# Patient Record
Sex: Female | Born: 1970
Health system: Southern US, Community
[De-identification: ages and names within clinical notes are randomized; demographics above are authoritative.]

## PROBLEM LIST (undated history)

## (undated) DIAGNOSIS — F209 Schizophrenia, unspecified: Secondary | ICD-10-CM

## (undated) DIAGNOSIS — F319 Bipolar disorder, unspecified: Secondary | ICD-10-CM

## (undated) DIAGNOSIS — F12150 Cannabis abuse with psychotic disorder with delusions: Secondary | ICD-10-CM

## (undated) DIAGNOSIS — Z8669 Personal history of other diseases of the nervous system and sense organs: Secondary | ICD-10-CM

## (undated) DIAGNOSIS — F419 Anxiety disorder, unspecified: Secondary | ICD-10-CM

## (undated) DIAGNOSIS — F329 Major depressive disorder, single episode, unspecified: Secondary | ICD-10-CM

## (undated) DIAGNOSIS — F22 Delusional disorders: Secondary | ICD-10-CM

## (undated) DIAGNOSIS — H00036 Abscess of eyelid left eye, unspecified eyelid: Secondary | ICD-10-CM

## (undated) DIAGNOSIS — F32A Depression, unspecified: Secondary | ICD-10-CM

## (undated) DIAGNOSIS — F988 Other specified behavioral and emotional disorders with onset usually occurring in childhood and adolescence: Secondary | ICD-10-CM

## (undated) DIAGNOSIS — N946 Dysmenorrhea, unspecified: Secondary | ICD-10-CM

## (undated) DIAGNOSIS — E559 Vitamin D deficiency, unspecified: Secondary | ICD-10-CM

## (undated) HISTORY — PX: WISDOM TOOTH EXTRACTION: SHX21

## (undated) HISTORY — DX: Depression, unspecified: F32.A

## (undated) HISTORY — DX: Other specified behavioral and emotional disorders with onset usually occurring in childhood and adolescence: F98.8

## (undated) HISTORY — DX: Vitamin D deficiency, unspecified: E55.9

## (undated) HISTORY — DX: Personal history of other diseases of the nervous system and sense organs: Z86.69

## (undated) HISTORY — DX: Delusional disorders: F22

## (undated) HISTORY — DX: Schizophrenia, unspecified: F20.9

## (undated) HISTORY — DX: Bipolar disorder, unspecified: F31.9

## (undated) HISTORY — DX: Major depressive disorder, single episode, unspecified: F32.9

## (undated) HISTORY — DX: Anxiety disorder, unspecified: F41.9

## (undated) HISTORY — DX: Dysmenorrhea, unspecified: N94.6

## (undated) HISTORY — DX: Cannabis abuse with psychotic disorder with delusions: F12.150

---

## 1998-05-14 ENCOUNTER — Emergency Department (HOSPITAL_COMMUNITY): Admission: EM | Admit: 1998-05-14 | Discharge: 1998-05-15 | Payer: Self-pay | Admitting: Emergency Medicine

## 2000-06-18 ENCOUNTER — Observation Stay (HOSPITAL_COMMUNITY): Admission: AD | Admit: 2000-06-18 | Discharge: 2000-06-19 | Payer: Self-pay | Admitting: *Deleted

## 2005-05-06 ENCOUNTER — Inpatient Hospital Stay (HOSPITAL_COMMUNITY): Admission: AD | Admit: 2005-05-06 | Discharge: 2005-05-08 | Payer: Self-pay | Admitting: Obstetrics and Gynecology

## 2005-06-20 ENCOUNTER — Encounter: Admission: RE | Admit: 2005-06-20 | Discharge: 2005-06-20 | Payer: Self-pay | Admitting: Obstetrics and Gynecology

## 2014-01-15 DIAGNOSIS — H00036 Abscess of eyelid left eye, unspecified eyelid: Secondary | ICD-10-CM

## 2014-01-15 HISTORY — DX: Abscess of eyelid left eye, unspecified eyelid: H00.036

## 2014-01-17 ENCOUNTER — Ambulatory Visit (INDEPENDENT_AMBULATORY_CARE_PROVIDER_SITE_OTHER): Payer: BC Managed Care – PPO | Admitting: Physician Assistant

## 2014-01-17 VITALS — BP 146/84 | HR 98 | Temp 98.1°F | Resp 16 | Ht 65.0 in | Wt 181.4 lb

## 2014-01-17 DIAGNOSIS — L0201 Cutaneous abscess of face: Secondary | ICD-10-CM

## 2014-01-17 DIAGNOSIS — L03211 Cellulitis of face: Principal | ICD-10-CM

## 2014-01-17 LAB — POCT CBC
Granulocyte percent: 69.8 %G (ref 37–80)
HCT, POC: 44.9 % (ref 37.7–47.9)
Hemoglobin: 14.3 g/dL (ref 12.2–16.2)
Lymph, poc: 2.2 (ref 0.6–3.4)
MCH, POC: 32.9 pg — AB (ref 27–31.2)
MCHC: 31.8 g/dL (ref 31.8–35.4)
MCV: 103.4 fL — AB (ref 80–97)
MID (cbc): 0.5 (ref 0–0.9)
MPV: 7.9 fL (ref 0–99.8)
POC Granulocyte: 6.3 (ref 2–6.9)
POC LYMPH PERCENT: 24.1 %L (ref 10–50)
POC MID %: 6.1 %M (ref 0–12)
Platelet Count, POC: 282 10*3/uL (ref 142–424)
RBC: 4.34 M/uL (ref 4.04–5.48)
RDW, POC: 13.5 %
WBC: 9 10*3/uL (ref 4.6–10.2)

## 2014-01-17 MED ORDER — DOXYCYCLINE HYCLATE 100 MG PO CAPS: 100.0000 mg | ORAL_CAPSULE | Freq: Two times a day (BID) | ORAL | Status: DC

## 2014-01-17 NOTE — Progress Notes (Signed)
   Subjective:    Patient ID: Angelica Weeks, female    DOB: Jul 18, 1971, 43 y.o.   MRN: [id number]  HPI 43 year old female presents for evaluation of treatment of swelling above her left eye. States for the past 2 days she has had a small "pimple" above her eye. Then this morning she woke up and her whole eye was swollen and painful to touch.  Has noted a small amount of clear drainage from the bump.    Denies fever, chills, headache, nausea, vomiting, or dizziness. No drainage from her eye or irritation.    Patient is otherwise doing well with no other concerns today.    Review of Systems  Constitutional: Negative for fever and chills.  Gastrointestinal: Negative for nausea and vomiting.  Skin: Positive for wound.  Neurological: Negative for dizziness and headaches.       Objective:   Physical Exam  Constitutional: She is oriented to person, place, and time. She appears well-developed and well-nourished.  HENT:  Head: Normocephalic and atraumatic.    Right Ear: External ear normal.  Left Ear: External ear normal.  Noted are has small scab with surrounding erythema. +swelling and +TTP.  There is some induration. Tenderness does not extend laterally across superior orbit or involve inferior orbit  Eyes: Conjunctivae are normal.  Neck: Normal range of motion. Neck supple.  Cardiovascular: Normal rate.   Pulmonary/Chest: Effort normal.  Neurological: She is alert and oriented to person, place, and time.  Psychiatric: She has a normal mood and affect. Her behavior is normal. Judgment and thought content normal.     Results for orders placed in visit on 01/17/14  POCT CBC      Result Value Range   WBC 9.0  4.6 - 10.2 K/uL   Lymph, poc 2.2  0.6 - 3.4   POC LYMPH PERCENT 24.1  10 - 50 %L   MID (cbc) 0.5  0 - 0.9   POC MID % 6.1  0 - 12 %M   POC Granulocyte 6.3  2 - 6.9   Granulocyte percent 69.8  37 - 80 %G   RBC 4.34  4.04 - 5.48 M/uL   Hemoglobin 14.3  12.2 - 16.2 g/dL   HCT, POC 13.244.9  44.037.7 - 47.9 %   MCV 103.4 (*) 80 - 97 fL   MCH, POC 32.9 (*) 27 - 31.2 pg   MCHC 31.8  31.8 - 35.4 g/dL   RDW, POC 10.213.5     Platelet Count, POC 282  142 - 424 K/uL   MPV 7.9  0 - 99.8 fL   Procedure: VCO. Local anesthesia with 2% lidocaine plain.  Aspiration attempted with 18G needle. No purulence obtained. Cleaned and band-aid applied. Patient tolerated well.      Assessment & Plan:  Cellulitis and abscess of face - Plan: POCT CBC, doxycycline (VIBRAMYCIN) 100 MG capsule  Early cellulitis - start doxycycline 100 mg bid x 10 days Warm compresses 2-3 times daily. Recheck tomorrow, sooner if worse.  Out of work today and tomorrow.

## 2014-01-18 ENCOUNTER — Ambulatory Visit (INDEPENDENT_AMBULATORY_CARE_PROVIDER_SITE_OTHER): Payer: BC Managed Care – PPO | Admitting: Emergency Medicine

## 2014-01-18 ENCOUNTER — Encounter: Payer: Self-pay | Admitting: Emergency Medicine

## 2014-01-18 VITALS — BP 144/100 | HR 102 | Temp 98.2°F | Resp 16 | Ht 65.5 in | Wt 180.0 lb

## 2014-01-18 DIAGNOSIS — L0291 Cutaneous abscess, unspecified: Secondary | ICD-10-CM

## 2014-01-18 DIAGNOSIS — L0390 Cellulitis, unspecified: Secondary | ICD-10-CM

## 2014-01-18 DIAGNOSIS — F319 Bipolar disorder, unspecified: Secondary | ICD-10-CM | POA: Insufficient documentation

## 2014-01-18 DIAGNOSIS — R030 Elevated blood-pressure reading, without diagnosis of hypertension: Secondary | ICD-10-CM

## 2014-01-18 DIAGNOSIS — F988 Other specified behavioral and emotional disorders with onset usually occurring in childhood and adolescence: Secondary | ICD-10-CM | POA: Insufficient documentation

## 2014-01-18 MED ORDER — CEFTRIAXONE SODIUM 1 G IJ SOLR
1.0000 g | Freq: Once | INTRAMUSCULAR | Status: AC
  Administered 2014-01-18: 1 g via INTRAMUSCULAR

## 2014-01-18 MED ORDER — DEXAMETHASONE SODIUM PHOSPHATE 100 MG/10ML IJ SOLN
10.0000 mg | Freq: Once | INTRAMUSCULAR | Status: AC
  Administered 2014-01-18: 10 mg via INTRAMUSCULAR

## 2014-01-18 NOTE — Patient Instructions (Signed)
Hypertension Hypertension is another name for high blood pressure. High blood pressure may mean that your heart needs to work harder to pump blood. Blood pressure consists of two numbers, which includes a higher number over a lower number (example: 110/72). HOME CARE   Make lifestyle changes as told by your doctor. This may include weight loss and exercise.  Take your blood pressure medicine every day.  Limit how much salt you use.  Stop smoking if you smoke.  Do not use drugs.  Talk to your doctor if you are using decongestants or birth control pills. These medicines might make blood pressure higher.  Females should not drink more than 1 alcoholic drink per day. Males should not drink more than 2 alcoholic drinks per day.  See your doctor as told. GET HELP RIGHT AWAY IF:   You have a blood pressure reading with a top number of 180 or higher.  You get a very bad headache.  You get blurred or changing vision.  You feel confused.  You feel weak, numb, or faint.  You get chest or belly (abdominal) pain.  You throw up (vomit).  You cannot breathe very well. MAKE SURE YOU:   Understand these instructions.  Will watch your condition.  Will get help right away if you are not doing well or get worse. Document Released: 05/19/2008 Document Revised: 02/23/2012 Document Reviewed: 05/19/2008 [facility] Patient Information 2014 [place], [place].   Orbital Cellulitis  ER if no improvement with symptoms in 24 hours or earlier if symptoms increase Orbital cellulitis is an infection of the soft tissue of the orbit without abscess formation. The eye socket is the area around and behind the eye. It usually comes on suddenly in children, but can happen at any age. This condition can lead to death if untreated.  CAUSES   A germ (bacterial) infection of the area around and behind the eye.  Long-term (chronic) sinus infections.  An object (foreign body) stuck behind the eye.  An  injury that goes through (penetrates) to tissues of the eyelids.  Trauma with secondary infection.  Fracture of the boney wall or floor of the orbit.  Serious eyelid infections.  Bite wounds.  Inflammation or infection of the lining membranes of the brain (meningitis).  Blood poisoning or infection (septicemia).  Dental area filled with pus (abscesses).  Severe uncontrolled diabetes (ketoacidosis). SYMPTOMS   Pain in the eye.  Redness and puffiness (swelling) of the eyelids. The swelling is often bad enough that the eye has to close.  Fever and feeling generally ill.  The lids and the cheek may be very red, hot and swollen.  A drop in vision.  Pain when touching the area around the eye.  The eye itself may look like it is "popping out" (proptosis).  Double vision  seeing two of everything (diplopia). DIAGNOSIS  An ophthalmologist can tell you if you have orbital cellulitis during an eye exam. It is important to know if the infection goes into the area behind the eye. True orbital cellulitis is a medical emergency. A CT scan may be needed to see if the sinuses are involved. A CT scan can also see if an abscess has formed behind the eye. TREATMENT  Orbital cellulitis should be treated in the hospital with medicine that kills germs (antibiotics). These antibiotics are given right into the bloodstream through a vein (intravenous). SEEK IMMEDIATE MEDICAL CARE IF:   You have red, swollen eyelids.  You have a fever.  You develop  double vision. MAKE SURE YOU:   Understand these instructions.  Will watch your condition.  Will get help right away if you are not doing well or get worse. Document Released: 11/25/2001 Document Revised: 02/23/2012 Document Reviewed: 03/27/2008 [facility] Patient Information 2014 [place].

## 2014-01-18 NOTE — Progress Notes (Signed)
   Subjective:    Patient ID: Angelica Weeks, female    DOB: [birth year], 43 y.o.   MRN: [id number]  HPI Comments: 43 yo female patient has not been seen since 2009 and has visit today for swollen left face. She denies bite/ trauma. She woke up with a large zit appearing inflammation at eyebrow. [facility] was attempted without success. She was started on Doxy 100 and has had 2 pills without any improvement with pain/ edema/ redness. She denies visual changes. She has been using hot compresses.    She denies hx of elevated BP and gets checked at Dr Nolen MuMckinney/ Psych q 6 months and has been normal.   Eye Pain   Headache  Associated symptoms include eye pain.   Current Outpatient Prescriptions on File Prior to Visit  Medication Sig Dispense Refill  . ALPRAZolam (XANAX) 1 MG tablet Take 1 mg by mouth 3 (three) times daily.      Marland Kitchen. amphetamine-dextroamphetamine (ADDERALL XR) 20 MG 24 hr capsule Take 20 mg by mouth 3 (three) times daily.      Marland Kitchen. desvenlafaxine (PRISTIQ) 100 MG 24 hr tablet Take 100 mg by mouth daily.      Marland Kitchen. doxycycline (VIBRAMYCIN) 100 MG capsule Take 1 capsule (100 mg total) by mouth 2 (two) times daily.  20 capsule  0  . LamoTRIgine XR (LAMICTAL XR) 200 MG TB24 Take by mouth.      . norethindrone-ethinyl estradiol (JUNEL FE,GILDESS FE,LOESTRIN FE) 1-20 MG-MCG tablet Take 1 tablet by mouth daily.       No current facility-administered medications on file prior to visit.   ALLERGIES No Known Allergies Past Medical History  Diagnosis Date  . ADD (attention deficit disorder)   . Depression   . Bipolar 1 disorder, depressed       Review of Systems  Eyes: Positive for pain.  Skin: Positive for color change and wound.  Neurological: Positive for headaches.  All other systems reviewed and are negative.   BP 144/100  Pulse 102  Temp(Src) 98.2 F (36.8 C) (Oral)  Resp 16  Ht 5' 5.5" (1.664 m)  Wt 180 lb (81.647 kg)  BMI 29.49 kg/m2  LMP  01/08/2014    recheck 144/88 Objective:   Physical Exam  Nursing note and vitals reviewed. Constitutional: She is oriented to person, place, and time. She appears well-developed and well-nourished.  HENT:  Head: Normocephalic and atraumatic.    Right Ear: External ear normal.  Left Ear: External ear normal.  Nose: Nose normal.  Mouth/Throat: Oropharynx is clear and moist. No oropharyngeal exudate.  Eyes: Conjunctivae and EOM are normal.  Neck: Normal range of motion.  Cardiovascular: Normal rate, regular rhythm, normal heart sounds and intact distal pulses.   Pulmonary/Chest: Effort normal and breath sounds normal.  Musculoskeletal: Normal range of motion.  Lymphadenopathy:    She has no cervical adenopathy.  Neurological: She is alert and oriented to person, place, and time.  Skin: Skin is warm and dry.  Psychiatric: She has a normal mood and affect. Judgment normal.          Assessment & Plan:  1. Cellulitis- Rocephin 1gm Dexamethasone 10 mg ADvised ER, declines currently but will go if no change or increased symptoms. Patient declines labs currently. 2. Elevated BP w/o HTN- Check BP call if >130/80, increase cardio

## 2014-01-20 ENCOUNTER — Encounter (HOSPITAL_COMMUNITY): Payer: Self-pay | Admitting: Emergency Medicine

## 2014-01-20 ENCOUNTER — Observation Stay (HOSPITAL_COMMUNITY)
Admission: EM | Admit: 2014-01-20 | Discharge: 2014-01-21 | Disposition: A | Discharge status: Home / Self Care | Payer: BC Managed Care – PPO | Attending: Internal Medicine | Admitting: Internal Medicine

## 2014-01-20 ENCOUNTER — Emergency Department (HOSPITAL_COMMUNITY): Payer: BC Managed Care – PPO

## 2014-01-20 ENCOUNTER — Ambulatory Visit: Payer: Self-pay | Admitting: Emergency Medicine

## 2014-01-20 DIAGNOSIS — Z8614 Personal history of Methicillin resistant Staphylococcus aureus infection: Secondary | ICD-10-CM | POA: Insufficient documentation

## 2014-01-20 DIAGNOSIS — Z79899 Other long term (current) drug therapy: Secondary | ICD-10-CM | POA: Insufficient documentation

## 2014-01-20 DIAGNOSIS — H5789 Other specified disorders of eye and adnexa: Secondary | ICD-10-CM | POA: Insufficient documentation

## 2014-01-20 DIAGNOSIS — L0201 Cutaneous abscess of face: Principal | ICD-10-CM | POA: Insufficient documentation

## 2014-01-20 DIAGNOSIS — F988 Other specified behavioral and emotional disorders with onset usually occurring in childhood and adolescence: Secondary | ICD-10-CM | POA: Insufficient documentation

## 2014-01-20 DIAGNOSIS — L03211 Cellulitis of face: Principal | ICD-10-CM | POA: Insufficient documentation

## 2014-01-20 DIAGNOSIS — F319 Bipolar disorder, unspecified: Secondary | ICD-10-CM | POA: Diagnosis present

## 2014-01-20 DIAGNOSIS — L0390 Cellulitis, unspecified: Secondary | ICD-10-CM | POA: Diagnosis present

## 2014-01-20 HISTORY — DX: Abscess of eyelid left eye, unspecified eyelid: H00.036

## 2014-01-20 LAB — BASIC METABOLIC PANEL
BUN: 10 mg/dL (ref 6–23)
CALCIUM: 9 mg/dL (ref 8.4–10.5)
CHLORIDE: 100 meq/L (ref 96–112)
CO2: 25 mEq/L (ref 19–32)
Creatinine, Ser: 0.63 mg/dL (ref 0.50–1.10)
GLUCOSE: 94 mg/dL (ref 70–99)
Potassium: 3.4 mEq/L — ABNORMAL LOW (ref 3.7–5.3)
Sodium: 140 mEq/L (ref 137–147)

## 2014-01-20 LAB — HCG, SERUM, QUALITATIVE: PREG SERUM: NEGATIVE

## 2014-01-20 LAB — CBC WITH DIFFERENTIAL/PLATELET
Basophils Absolute: 0 10*3/uL (ref 0.0–0.1)
Basophils Relative: 0 % (ref 0–1)
EOS ABS: 0.1 10*3/uL (ref 0.0–0.7)
Eosinophils Relative: 1 % (ref 0–5)
HCT: 41.3 % (ref 36.0–46.0)
Hemoglobin: 14.2 g/dL (ref 12.0–15.0)
LYMPHS ABS: 2.5 10*3/uL (ref 0.7–4.0)
LYMPHS PCT: 21 % (ref 12–46)
MCH: 33.5 pg (ref 26.0–34.0)
MCHC: 34.4 g/dL (ref 30.0–36.0)
MCV: 97.4 fL (ref 78.0–100.0)
Monocytes Absolute: 1.1 10*3/uL — ABNORMAL HIGH (ref 0.1–1.0)
Monocytes Relative: 9 % (ref 3–12)
NEUTROS ABS: 8.1 10*3/uL — AB (ref 1.7–7.7)
NEUTROS PCT: 69 % (ref 43–77)
PLATELETS: 278 10*3/uL (ref 150–400)
RBC: 4.24 MIL/uL (ref 3.87–5.11)
RDW: 12.8 % (ref 11.5–15.5)
WBC: 11.8 10*3/uL — AB (ref 4.0–10.5)

## 2014-01-20 MED ORDER — SODIUM CHLORIDE 0.9 % IJ SOLN: 3.0000 mL | INTRAMUSCULAR | Status: DC | PRN

## 2014-01-20 MED ORDER — ALPRAZOLAM 0.5 MG PO TABS: 1.0000 mg | ORAL_TABLET | Freq: Three times a day (TID) | ORAL | Status: DC | PRN

## 2014-01-20 MED ORDER — PIPERACILLIN-TAZOBACTAM 3.375 G IVPB
3.3750 g | Freq: Three times a day (TID) | INTRAVENOUS | Status: DC
  Administered 2014-01-20 – 2014-01-21 (×3): 3.375 g via INTRAVENOUS
  Filled 2014-01-20 (×5): qty 50

## 2014-01-20 MED ORDER — DESVENLAFAXINE SUCCINATE ER 50 MG PO TB24: 50.0000 mg | ORAL_TABLET | Freq: Every day | ORAL | Status: DC

## 2014-01-20 MED ORDER — LAMOTRIGINE ER 200 MG PO TB24: 200.0000 mg | ORAL_TABLET | Freq: Every day | ORAL | Status: DC

## 2014-01-20 MED ORDER — LAMOTRIGINE 150 MG PO TABS: 300.0000 mg | ORAL_TABLET | Freq: Every day | ORAL | Status: DC

## 2014-01-20 MED ORDER — KETOROLAC TROMETHAMINE 30 MG/ML IJ SOLN
30.0000 mg | Freq: Once | INTRAMUSCULAR | Status: AC
  Administered 2014-01-20: 30 mg via INTRAVENOUS
  Filled 2014-01-20: qty 1

## 2014-01-20 MED ORDER — HEPARIN SODIUM (PORCINE) 5000 UNIT/ML IJ SOLN
5000.0000 [IU] | Freq: Three times a day (TID) | INTRAMUSCULAR | Status: DC
  Administered 2014-01-20 – 2014-01-21 (×3): 5000 [IU] via SUBCUTANEOUS
  Filled 2014-01-20 (×6): qty 1

## 2014-01-20 MED ORDER — LAMOTRIGINE 150 MG PO TABS
300.0000 mg | ORAL_TABLET | Freq: Every day | ORAL | Status: DC
  Filled 2014-01-20: qty 2

## 2014-01-20 MED ORDER — NORETHIN ACE-ETH ESTRAD-FE 1-20 MG-MCG PO TABS: 1.0000 | ORAL_TABLET | Freq: Every day | ORAL | Status: DC

## 2014-01-20 MED ORDER — VANCOMYCIN HCL IN DEXTROSE 1-5 GM/200ML-% IV SOLN
1000.0000 mg | Freq: Two times a day (BID) | INTRAVENOUS | Status: DC
  Administered 2014-01-20 – 2014-01-21 (×2): 1000 mg via INTRAVENOUS
  Filled 2014-01-20 (×3): qty 200

## 2014-01-20 MED ORDER — VANCOMYCIN HCL IN DEXTROSE 1-5 GM/200ML-% IV SOLN
1000.0000 mg | Freq: Once | INTRAVENOUS | Status: AC
  Administered 2014-01-20: 1000 mg via INTRAVENOUS
  Filled 2014-01-20: qty 200

## 2014-01-20 MED ORDER — OXYCODONE HCL 5 MG PO TABS
5.0000 mg | ORAL_TABLET | ORAL | Status: DC | PRN
  Administered 2014-01-20 – 2014-01-21 (×2): 5 mg via ORAL
  Filled 2014-01-20 (×2): qty 1

## 2014-01-20 MED ORDER — SODIUM CHLORIDE 0.9 % IV SOLN
250.0000 mL | INTRAVENOUS | Status: DC | PRN
Start: 2014-01-20 — End: 2014-01-21

## 2014-01-20 MED ORDER — VENLAFAXINE HCL ER 150 MG PO CP24
150.0000 mg | ORAL_CAPSULE | Freq: Every day | ORAL | Status: DC
Start: 2014-01-21 — End: 2014-01-20
  Filled 2014-01-20: qty 1

## 2014-01-20 MED ORDER — SODIUM CHLORIDE 0.9 % IJ SOLN
3.0000 mL | Freq: Two times a day (BID) | INTRAMUSCULAR | Status: DC
  Administered 2014-01-20 (×2): 3 mL via INTRAVENOUS

## 2014-01-20 MED ORDER — ACETAMINOPHEN 650 MG RE SUPP
650.0000 mg | Freq: Four times a day (QID) | RECTAL | Status: DC | PRN
Start: 2014-01-20 — End: 2014-01-21

## 2014-01-20 MED ORDER — ACETAMINOPHEN 325 MG PO TABS
650.0000 mg | ORAL_TABLET | Freq: Four times a day (QID) | ORAL | Status: DC | PRN
Start: 2014-01-20 — End: 2014-01-21

## 2014-01-20 MED ORDER — IOHEXOL 300 MG/ML  SOLN
75.0000 mL | Freq: Once | INTRAMUSCULAR | Status: AC | PRN
  Administered 2014-01-20: 75 mL via INTRAVENOUS

## 2014-01-20 MED ORDER — SODIUM CHLORIDE 0.9 % IV BOLUS (SEPSIS)
1000.0000 mL | Freq: Once | INTRAVENOUS | Status: AC
  Administered 2014-01-20: 1000 mL via INTRAVENOUS

## 2014-01-20 NOTE — Progress Notes (Signed)
ANTIBIOTIC CONSULT NOTE - INITIAL  Pharmacy Consult for vancomycin and Zosyn Indication: L orbital cellulitis  No Known Allergies  Patient Measurements: Height: 5\' 5"  (165.1 cm) Weight: 180 lb 12.4 oz (82 kg) IBW/kg (Calculated) : 57  Vital Signs: Temp: 98.3 F (36.8 C) (02/06 1253) Temp src: Oral (02/06 1253) BP: 138/88 mmHg (02/06 1253) Pulse Rate: 72 (02/06 1253) Intake/Output from previous day:   Intake/Output from this shift:    Labs:  Recent Labs  01/20/14 0820  WBC 11.8*  HGB 14.2  PLT 278  CREATININE 0.63   Estimated Creatinine Clearance: 98.9 ml/min (by C-G formula based on Cr of 0.63). No results found for this basename: VANCOTROUGH, VANCOPEAK, VANCORANDOM, GENTTROUGH, GENTPEAK, GENTRANDOM, TOBRATROUGH, TOBRAPEAK, TOBRARND, AMIKACINPEAK, AMIKACINTROU, AMIKACIN,  in the last 72 hours   Microbiology: No results found for this or any previous visit (from the past 720 hour(s)).  Medical History: Past Medical History  Diagnosis Date  . ADD (attention deficit disorder)   . Depression   . Bipolar 1 disorder, depressed   . Cellulitis of left eyelid 01/2014    Medications:  Prescriptions prior to admission  Medication Sig Dispense Refill  . ALPRAZolam (XANAX) 1 MG tablet Take 1 mg by mouth 3 (three) times daily as needed for anxiety.       Marland Kitchen. amphetamine-dextroamphetamine (ADDERALL XR) 20 MG 24 hr capsule Take 20 mg by mouth 3 (three) times daily.      Marland Kitchen. desvenlafaxine (PRISTIQ) 100 MG 24 hr tablet Take 100 mg by mouth daily.      Marland Kitchen. doxycycline (VIBRAMYCIN) 100 MG capsule Take 1 capsule (100 mg total) by mouth 2 (two) times daily.  20 capsule  0  . LamoTRIgine XR (LAMICTAL XR) 200 MG TB24 Take 200 mg by mouth daily.       . norethindrone-ethinyl estradiol (JUNEL FE,GILDESS FE,LOESTRIN FE) 1-20 MG-MCG tablet Take 1 tablet by mouth daily.       Assessment: 43 y/o female who presents to the ED with swelling, pain and redness above her L eyebrow which started out  as a pimple. She was seen at Urgent Care on 2/3 and prescribed doxycycline but returned to her PCP on 2/4 with worsening. She was given Rocephin and dexamethasone there.   Pharmacy consulted to begin vancomycin and Zosyn for L orbital cellulitis. She received vancomycin 1g IV at 09:05 this morning. She is afebrile, WBC are elevated, and renal function is normal. Blood cultures are pending.  Goal of Therapy:  Vancomycin trough level 10-15 mcg/ml  Plan:  -Vancomycin 1000 mg IV q12h -Zosyn 3.375 g IV q8h to be infused over 4 hours -Monitor renal function, clinical course, and [employer]  Sage Kopera [place], 17[address] Rainbow BoulevardPharm.D., BCPS Clinical Pharmacist Pager: [phone] 01/20/2014 2:02 PM

## 2014-01-20 NOTE — ED Notes (Signed)
Pt undressed, in gown, continuous bp cuff and pulse ox 

## 2014-01-20 NOTE — ED Notes (Signed)
Pt c/o left eye swelling. [facility]. Pt has small scabbed area above left eye that she stated she thought was a "pimple" and tried to pop it. Denies any problems seeing out of left eye.

## 2014-01-20 NOTE — H&P (Addendum)
[facility] History and Physical  Angelica Stableshley Brobeck AVW:[id number] DOB: Jul 29, 1971 DOA: 01/20/2014  Referring physician: Dr. Elesa MassedWard PCP: Nadean CorwinMCKEOWN,WILLIAM DAVID, MD   Chief Complaint: pain and swelling above left eye  HPI: Angelica Weeks is a 43 y.o. female  With a history of depression, bipolar, and ADD. She presents with several days of swelling, pain, and redness to above left eyebrow. She reports that it started with a small pimple to the medial aspect of the upper left eyebrow on 01/16/14 and was seen on 01/17/14 at urgent care where she was started on doxycycline and probed with wound with some purulent drainage. Her symptoms worsened on 01/18/14 and she was seen by her PCP and given IM Rocephin and dexamethasone. Symptoms improved on 2/5 and then worsened today. There is no drainage currently from the wound. Redness and swelling to left upper eyelid. Pt denies any vision changes, pain with changes in gaze, or facial injury. Pt denies any fever, chills, N/V/D, or congestion. Does also report a history of MRSA infection.  In the ED she was given IV Vancomycin. Was found to have elevated WBC of 11.8.  Admit to Med-surg for IV antibiotics.   Review of Systems:  Constitutional:  No weight loss, night sweats, Fevers, chills, fatigue.  HEENT:  No headaches, Difficulty swallowing,Tooth/dental problems,Sore throat,  No sneezing, itching, ear ache, nasal congestion, post nasal drip,  Cardio-vascular:  No chest pain, Orthopnea, PND, swelling in lower extremities, anasarca, dizziness, palpitations  GI:  No heartburn, indigestion, abdominal pain, nausea, vomiting, diarrhea, change in bowel habits, loss of appetite  Resp:  No shortness of breath with exertion or at rest. No excess mucus, no productive cough, No non-productive cough, No coughing up of blood.No change in color of mucus.No wheezing.No chest wall deformity  Skin: + cellulitis above left eye starting on 01/16/14 no rash or lesions.  GU:  no  dysuria, change in color of urine, no urgency or frequency. No flank pain.  Musculoskeletal:  No joint pain or swelling. No decreased range of motion. No back pain.  Psych:  No change in mood or affect. No depression or anxiety. No memory loss.   Past Medical History  Diagnosis Date  . ADD (attention deficit disorder)   . Depression   . Bipolar 1 disorder, depressed    History reviewed. No pertinent past surgical history. Social History:  reports that she has been smoking Cigarettes.  She has been smoking about 1.00 pack per day. She does not have any smokeless tobacco history on file. She reports that she drinks alcohol. She reports that she does not use illicit drugs.  No Known Allergies  Family History  Problem Relation Age of Onset  . Adopted: Yes     Prior to Admission medications   Medication Sig Start Date End Date Taking? Authorizing Provider  ALPRAZolam Prudy Feeler(XANAX) 1 MG tablet Take 1 mg by mouth 3 (three) times daily as needed for anxiety.    Yes Historical Provider, MD  amphetamine-dextroamphetamine (ADDERALL XR) 20 MG 24 hr capsule Take 20 mg by mouth 3 (three) times daily.   Yes Historical Provider, MD  desvenlafaxine (PRISTIQ) 100 MG 24 hr tablet Take 100 mg by mouth daily.   Yes Historical Provider, MD  doxycycline (VIBRAMYCIN) 100 MG capsule Take 1 capsule (100 mg total) by mouth 2 (two) times daily. 01/17/14  Yes Nelva NayHeather M Marte, PA-C  LamoTRIgine XR (LAMICTAL XR) 200 MG TB24 Take 200 mg by mouth daily.    Yes Historical Provider, MD  norethindrone-ethinyl estradiol (JUNEL FE,GILDESS FE,LOESTRIN FE) 1-20 MG-MCG tablet Take 1 tablet by mouth daily.   Yes Historical Provider, MD   Physical Exam: Filed Vitals:   01/20/14 1100  BP: 134/76  Pulse: 78  Temp:   Resp:     BP 134/76  Pulse 78  Temp(Src) 98.2 F (36.8 C) (Oral)  Resp 18  SpO2 98%  LMP 01/08/2014  General:  Appears calm and comfortable Eyes: PERRL,+ redness and edema to left upper lid, normal irises &  conjunctiva ENT: grossly normal hearing, lips & tongue Neck: no LAD, masses or thyromegaly Cardiovascular: RRR, no m/r/g. No LE edema. Telemetry: SR, no arrhythmias  Respiratory: CTA bilaterally, no w/r/r. Normal respiratory effort. Abdomen: soft, nt, nd Skin: + orbital cellulitis on left with central wound to superior medial aspect of left eyebrow. Approx 2.5x 2.5 cm, no rash seen on limited exam Musculoskeletal: grossly normal tone BUE/BLE Psychiatric: grossly normal mood and affect, speech fluent and appropriate Neurologic: Appropriately answering questions.          Labs on Admission:  Basic Metabolic Panel:  Recent Labs Lab 01/20/14 0820  NA 140  K 3.4*  CL 100  CO2 25  GLUCOSE 94  BUN 10  CREATININE 0.63  CALCIUM 9.0   Liver Function Tests: No results found for this basename: AST, ALT, ALKPHOS, BILITOT, PROT, ALBUMIN,  in the last 168 hours No results found for this basename: LIPASE, AMYLASE,  in the last 168 hours No results found for this basename: AMMONIA,  in the last 168 hours CBC:  Recent Labs Lab 01/17/14 1127 01/20/14 0820  WBC 9.0 11.8*  NEUTROABS  --  8.1*  HGB 14.3 14.2  HCT 44.9 41.3  MCV 103.4* 97.4  PLT  --  278   Cardiac Enzymes: No results found for this basename: CKTOTAL, CKMB, CKMBINDEX, TROPONINI,  in the last 168 hours  BNP (last 3 results) No results found for this basename: PROBNP,  in the last 8760 hours CBG: No results found for this basename: GLUCAP,  in the last 168 hours  Radiological Exams on Admission: Ct Maxillofacial W/cm  01/20/2014   CLINICAL DATA:  Left periorbital soft tissue swelling  EXAM: CT MAXILLOFACIAL WITH CONTRAST  TECHNIQUE: Multidetector CT imaging of the maxillofacial structures was performed with intravenous contrast. Multiplanar CT image reconstructions were also generated. A small metallic BB was placed on the right temple in order to reliably differentiate right from left.  CONTRAST:  75mL OMNIPAQUE IOHEXOL  300 MG/ML  SOLN  COMPARISON:  None.  FINDINGS: Soft tissue swelling is appreciated along the medial superior portion of the orbit, lower forehead region, medial aspect of the brow extending into the preseptal region of the orbit. There is no evidence of postseptal extension. No evidence of loculated fluid collections appreciated.  The left orbit is otherwise unremarkable. The visualized extraocular musculature appears unremarkable without evidence of asymmetry nor edema. The intraconal and extraconal fat is unremarkable. The globe demonstrates homogeneous attenuation. The visualized optic nerve is unremarkable.  The right orbit is unremarkable.  The osseous structures demonstrate no evidence of fracture or destructive lesion. Areas of mucosal thickening project within the ethmoid air cells with areas of opacification. The frontal sinuses, sphenoid air cells and maxillary sinuses are patent. The mastoid air cells are patent. The visualized anterior base of the brain and visualized portions of the neck are unremarkable. The salivary glands and parotid glands are symmetric. This study is degraded secondary tube metallic beam hardening due to  dental amalgam. The mandible and temporomandibular joints are unremarkable. The opacified vascular structures unremarkable. Visualized base of the neck are maintained. The airway is patent.  IMPRESSION: Findings consistent with cellulitis involving the medial lower forehead region, parietal with extension into the preseptal region of the orbit. There is no evidence of postseptal extension nor loculated fluid collection to suggest abscess.   Electronically Signed   By: Salome Holmes M.D.   On: 01/20/2014 10:17    EKG:   Assessment/Plan Active Problems:  Addendum: periorbital cellulitis on left - Broad spectrum IV antibiotics: Vanc and Zosyn - PRN pain medication - Orbital CT: cellulitis involving medial lower forehead region, parietal with extension into the preseptal  region of the orbit. No evidence of post septal extension or loculated fluid collections to suggest abscess.  Depression/Bipolar -Continue home medication  ADD -Will hold medication at this time  DVT prevention: - SQ Heparin   Code Status: DNR Family Communication: Discussed with pt and husband at bedside Disposition Plan: Admit to medical surgical floor for IV antibiotics  Time spent: >60 min  Penny Pia [facility] Pager [phone]

## 2014-01-20 NOTE — Progress Notes (Signed)
UR completed. Meoshia Billing RN CCM Case Mgmt phone [phone] 

## 2014-01-20 NOTE — Progress Notes (Signed)
Received report from E.D. Nurse. There was a 30 minute delay till report due to unit being really busy and no one realized the pager had gone off.

## 2014-01-20 NOTE — Progress Notes (Signed)
Pt admitted from E.D. With left orbital cellulitis. Alert and oriented, IV to LAFA NSL. Open round area about forehead and reddened orbital area. No complaints at this time. Non tele, oriented to room and call bell system. Will continue to monitor. [place], Energy East CorporationCortney Elizabeth

## 2014-01-20 NOTE — ED Provider Notes (Addendum)
TIME SEEN: 7:58 AM  CHIEF COMPLAINT: Left swelling and pain  HPI:  Patient is a 43 year old female with a history of depression, bipolar disorder, ADD who presents the emergency department with several days of left eye swelling, pain and redness. Patient reports that she started with a small pimple to the medial aspect of her upper left eyebrow on 01/16/14. She was seen in urgent care on 01/17/14 and had the wound probed with some clear, purulent drainage and was started on doxycycline. Her symptoms became worse and on 01/18/14 she was seen again and given a injection of Rocephin and dexamethasone. At that time she declined coming to the emergency department. She is here today because she reports the swelling, redness and pain are worse and now involve the upper eyelid and. There is no vision change. No pain with eye movement. No history of facial injury. This wound is no longer draining. She denies any fevers, chills, nausea, vomiting or diarrhea.  ROS: See HPI Constitutional: no fever  Eyes: no drainage  ENT: no runny nose   Cardiovascular:  no chest pain  Resp: no SOB  GI: no vomiting GU: no dysuria Integumentary: no rash  Allergy: no hives  Musculoskeletal: no leg swelling  Neurological: no slurred speech ROS otherwise negative  PAST MEDICAL HISTORY/PAST SURGICAL HISTORY:  Past Medical History  Diagnosis Date  . ADD (attention deficit disorder)   . Depression   . Bipolar 1 disorder, depressed     MEDICATIONS:  Prior to Admission medications   Medication Sig Start Date End Date Taking? Authorizing Provider  ALPRAZolam Prudy Feeler) 1 MG tablet Take 1 mg by mouth 3 (three) times daily.    Historical Provider, MD  amphetamine-dextroamphetamine (ADDERALL XR) 20 MG 24 hr capsule Take 20 mg by mouth 3 (three) times daily.    Historical Provider, MD  desvenlafaxine (PRISTIQ) 100 MG 24 hr tablet Take 100 mg by mouth daily.    Historical Provider, MD  doxycycline (VIBRAMYCIN) 100 MG capsule Take 1  capsule (100 mg total) by mouth 2 (two) times daily. 01/17/14   Nelva Nay, PA-C  LamoTRIgine XR (LAMICTAL XR) 200 MG TB24 Take by mouth.    Historical Provider, MD  norethindrone-ethinyl estradiol (JUNEL FE,GILDESS FE,LOESTRIN FE) 1-20 MG-MCG tablet Take 1 tablet by mouth daily.    Historical Provider, MD    ALLERGIES:  No Known Allergies  SOCIAL HISTORY:  History  Substance Use Topics  . Smoking status: Current Every Day Smoker -- 1.00 packs/day    Types: Cigarettes  . Smokeless tobacco: Not on file  . Alcohol Use: Yes     Comment: occasional    FAMILY HISTORY: Family History  Problem Relation Age of Onset  . Adopted: Yes    EXAM: BP 147/79  Pulse 106  Temp(Src) 98.2 F (36.8 C) (Oral)  Resp 18  SpO2 100%  LMP 01/08/2014 CONSTITUTIONAL: Alert and oriented and responds appropriately to questions. Well-appearing; well-nourished, nontoxic HEAD: Normocephalic EYES: Conjunctivae clear, PERRL, extraocular movements intact and painless; patient has a 2 x 1 cm slightly fluctuant area that is superficial to the medial upper left eyebrow that is scabbed over has no current drainage with a minimal amount of surrounding erythema and warmth with dependent edema into her upper and lower eyelids on the left and into the cheek ENT: normal nose; no rhinorrhea; moist mucous membranes; pharynx without lesions noted NECK: Supple, no meningismus, no LAD  CARD: Regular, tachycardic; S1 and S2 appreciated; no murmurs, no clicks, no rubs,  no gallops RESP: Normal chest excursion without splinting or tachypnea; breath sounds clear and equal bilaterally; no wheezes, no rhonchi, no rales,  ABD/GI: Normal bowel sounds; non-distended; soft, non-tender, no rebound, no guarding BACK:  The back appears normal and is non-tender to palpation, there is no CVA tenderness EXT: Normal ROM in all joints; non-tender to palpation; no edema; normal capillary refill; no cyanosis    SKIN: Normal color for age and  race; warm NEURO: Moves all extremities equally PSYCH: The patient's mood and manner are appropriate. Grooming and personal hygiene are appropriate.  MEDICAL DECISION MAKING: Patient here with worsening facial cellulitis. Suspect that a large amount of her swelling is due to dependent edema. Patient has been on several days of doxycycline without improvement of her symptoms. Patient however is mildly tachycardic but afebrile. Will obtain labs, cultures, CT face to rule out orbital cellulitis or deeper abscess that may need drainage. Will give IV vancomycin, IV fluids, Toradol.  ED PROGRESS: Patient has a leukocytosis of 11.8 with left shift. Her CT scan shows cellulitis but no signs of abscess. There is no post septal cellulitis. Given she has been on oral antibiotics for 3 days without improvement and has a leukocytosis and tachycardia, will admit for IV antibiotics for facial cellulitis. Her PCP is Dr. Oneta RackMcKeown.  10:40 AM  Spoke with Dr. Cena BentonVega with hospitalist service who will see the patient in the emergency department.     Layla MawKristen N Ward, DO 01/20/14 1027  Kristen N Ward, DO 01/20/14 1041

## 2014-01-21 DIAGNOSIS — F988 Other specified behavioral and emotional disorders with onset usually occurring in childhood and adolescence: Secondary | ICD-10-CM

## 2014-01-21 DIAGNOSIS — L0390 Cellulitis, unspecified: Secondary | ICD-10-CM

## 2014-01-21 DIAGNOSIS — F3130 Bipolar disorder, current episode depressed, mild or moderate severity, unspecified: Secondary | ICD-10-CM

## 2014-01-21 DIAGNOSIS — L0291 Cutaneous abscess, unspecified: Secondary | ICD-10-CM

## 2014-01-21 LAB — BASIC METABOLIC PANEL WITH GFR
BUN: 9 mg/dL (ref 6–23)
CO2: 21 meq/L (ref 19–32)
Calcium: 8.7 mg/dL (ref 8.4–10.5)
Chloride: 106 meq/L (ref 96–112)
Creatinine, Ser: 0.62 mg/dL (ref 0.50–1.10)
GFR calc Af Amer: >90 mL/min
GFR calc non Af Amer: >90 mL/min
Glucose, Bld: 83 mg/dL (ref 70–99)
Potassium: 4.4 meq/L (ref 3.7–5.3)
Sodium: 141 meq/L (ref 137–147)

## 2014-01-21 LAB — CBC
HCT: 37 % (ref 36.0–46.0)
HEMOGLOBIN: 12.7 g/dL (ref 12.0–15.0)
MCH: 33.4 pg (ref 26.0–34.0)
MCHC: 34.3 g/dL (ref 30.0–36.0)
MCV: 97.4 fL (ref 78.0–100.0)
PLATELETS: 256 10*3/uL (ref 150–400)
RBC: 3.8 MIL/uL — AB (ref 3.87–5.11)
RDW: 12.8 % (ref 11.5–15.5)
WBC: 6.6 10*3/uL (ref 4.0–10.5)

## 2014-01-21 MED ORDER — SULFAMETHOXAZOLE-TMP DS 800-160 MG PO TABS: 1.0000 | ORAL_TABLET | Freq: Two times a day (BID) | ORAL | Status: DC

## 2014-01-21 MED ORDER — CEPHALEXIN 500 MG PO CAPS: 500.0000 mg | ORAL_CAPSULE | Freq: Two times a day (BID) | ORAL | Status: DC

## 2014-01-21 NOTE — Progress Notes (Signed)
Utilization Review completed.  

## 2014-01-21 NOTE — Discharge Summary (Signed)
Physician Discharge Summary  Angelica Stableshley Stanger JYN:[id number] DOB: November 16, 1971 DOA: 01/20/2014  PCP: Nadean CorwinMCKEOWN,WILLIAM DAVID, MD  Admit date: 01/20/2014 Discharge date: 01/21/2014  Time spent: 40 minutes  Recommendations for Outpatient Follow-up:  1. Followup with primary care physician within one week  Discharge Diagnoses:  Principal Problem:   Cellulitis Active Problems:   Bipolar 1 disorder, depressed   Discharge Condition: Stable  Diet recommendation: Regular  Filed Weights   01/20/14 1253  Weight: 82 kg (180 lb 12.4 oz)    History of present illness:  Angelica Weeks is a 43 y.o. female  With a history of depression, bipolar, and ADD. She presents with several days of swelling, pain, and redness to above left eyebrow. She reports that it started with a small pimple to the medial aspect of the upper left eyebrow on 01/16/14 and was seen on 01/17/14 at urgent care where she was started on doxycycline and probed with wound with some purulent drainage. Her symptoms worsened on 01/18/14 and she was seen by her PCP and given IM Rocephin and dexamethasone. Symptoms improved on 2/5 and then worsened today. There is no drainage currently from the wound. Redness and swelling to left upper eyelid. Pt denies any vision changes, pain with changes in gaze, or facial injury. Pt denies any fever, chills, N/V/D, or congestion. Does also report a history of MRSA infection.  Hospital Course:   1. Periorbital cellulitis, left: As mentioned above the patient did receive Rocephin and doxycycline as outpatient, but that was complicated by swelling and worsening. Patient received vancomycin and Zosyn overnight, according to her and her husband at bedside the swelling and the redness resolved, patient asked to be discharged home, saying that she has a 41-year-old daughter at home and she has to take care of her.. CT scan of the face reviewed and showed cellulitis in the medial lower forehead region with extension into the  preseptal region of the orbit there is no evidence of post septal extension or loculated fluid collection. Patient discharged on Bactrim and Keflex as she does have history of MRSA infection.  2. Depression/bipolar: Continue home medications.  3. ADD: Home medications continued.  Procedures:  None  Consultations:  None  Discharge Exam: Filed Vitals:   01/21/14 0443  BP: 143/87  Pulse: 81  Temp: 97.9 F (36.6 C)  Resp: 17   General: Alert and awake, oriented x3, not in any acute distress. HEENT: anicteric sclera, pupils reactive to light and accommodation, EOMI CVS: S1-S2 clear, no murmur rubs or gallops Chest: clear to auscultation bilaterally, no wheezing, rales or rhonchi Abdomen: soft nontender, nondistended, normal bowel sounds, no organomegaly Extremities: no cyanosis, clubbing or edema noted bilaterally Neuro: Cranial nerves II-XII intact, no focal neurological deficits  Discharge Instructions     Medication List    STOP taking these medications       doxycycline 100 MG capsule  Commonly known as:  VIBRAMYCIN      TAKE these medications       amphetamine-dextroamphetamine 20 MG 24 hr capsule  Commonly known as:  ADDERALL XR  Take 20 mg by mouth 3 (three) times daily.     cephALEXin 500 MG capsule  Commonly known as:  KEFLEX  Take 1 capsule (500 mg total) by mouth 2 (two) times daily.     desvenlafaxine 100 MG 24 hr tablet  Commonly known as:  PRISTIQ  Take 100 mg by mouth daily.     lamoTRIgine 200 MG tablet  Commonly known as:  LAMICTAL  Take 300 mg by mouth at bedtime.     norethindrone-ethinyl estradiol 1-20 MG-MCG tablet  Commonly known as:  JUNEL FE,GILDESS FE,LOESTRIN FE  Take 1 tablet by mouth daily.     sulfamethoxazole-trimethoprim 800-160 MG per tablet  Commonly known as:  BACTRIM DS  Take 1 tablet by mouth 2 (two) times daily.     XANAX 1 MG tablet  Generic drug:  ALPRAZolam  Take 1 mg by mouth 3 (three) times daily as needed for  anxiety.       No Known Allergies    The results of significant diagnostics from this hospitalization (including imaging, microbiology, ancillary and laboratory) are listed below for reference.    Significant Diagnostic Studies: Ct Maxillofacial W/cm  01/20/2014   CLINICAL DATA:  Left periorbital soft tissue swelling  EXAM: CT MAXILLOFACIAL WITH CONTRAST  TECHNIQUE: Multidetector CT imaging of the maxillofacial structures was performed with intravenous contrast. Multiplanar CT image reconstructions were also generated. A small metallic BB was placed on the right temple in order to reliably differentiate right from left.  CONTRAST:  75mL OMNIPAQUE IOHEXOL 300 MG/ML  SOLN  COMPARISON:  None.  FINDINGS: Soft tissue swelling is appreciated along the medial superior portion of the orbit, lower forehead region, medial aspect of the brow extending into the preseptal region of the orbit. There is no evidence of postseptal extension. No evidence of loculated fluid collections appreciated.  The left orbit is otherwise unremarkable. The visualized extraocular musculature appears unremarkable without evidence of asymmetry nor edema. The intraconal and extraconal fat is unremarkable. The globe demonstrates homogeneous attenuation. The visualized optic nerve is unremarkable.  The right orbit is unremarkable.  The osseous structures demonstrate no evidence of fracture or destructive lesion. Areas of mucosal thickening project within the ethmoid air cells with areas of opacification. The frontal sinuses, sphenoid air cells and maxillary sinuses are patent. The mastoid air cells are patent. The visualized anterior base of the brain and visualized portions of the neck are unremarkable. The salivary glands and parotid glands are symmetric. This study is degraded secondary tube metallic beam hardening due to dental amalgam. The mandible and temporomandibular joints are unremarkable. The opacified vascular structures  unremarkable. Visualized base of the neck are maintained. The airway is patent.  IMPRESSION: Findings consistent with cellulitis involving the medial lower forehead region, parietal with extension into the preseptal region of the orbit. There is no evidence of postseptal extension nor loculated fluid collection to suggest abscess.   Electronically Signed   By: Salome Holmes M.D.   On: 01/20/2014 10:17    Microbiology: No results found for this or any previous visit (from the past 240 hour(s)).   Labs: Basic Metabolic Panel:  Recent Labs Lab 01/20/14 0820 01/21/14 0539  NA 140 141  K 3.4* 4.4  CL 100 106  CO2 25 21  GLUCOSE 94 83  BUN 10 9  CREATININE 0.63 0.62  CALCIUM 9.0 8.7   Liver Function Tests: No results found for this basename: AST, ALT, ALKPHOS, BILITOT, PROT, ALBUMIN,  in the last 168 hours No results found for this basename: LIPASE, AMYLASE,  in the last 168 hours No results found for this basename: AMMONIA,  in the last 168 hours CBC:  Recent Labs Lab 01/20/14 0820 01/21/14 0539  WBC 11.8* 6.6  NEUTROABS 8.1*  --   HGB 14.2 12.7  HCT 41.3 37.0  MCV 97.4 97.4  PLT 278 256   Cardiac Enzymes: No results found for this  basename: CKTOTAL, CKMB, CKMBINDEX, TROPONINI,  in the last 168 hours BNP: BNP (last 3 results) No results found for this basename: PROBNP,  in the last 8760 hours CBG: No results found for this basename: GLUCAP,  in the last 168 hours     Signed:  Chantea Surace A  [facility] 01/21/2014, 10:47 AM

## 2014-01-21 NOTE — Progress Notes (Signed)
Pt given discharge paper work and PIV removed.  Work note given and explained that prescriptions can be picked up at [facility].

## 2014-01-26 LAB — CULTURE, BLOOD (ROUTINE X 2)
CULTURE: NO GROWTH
Culture: NO GROWTH

## 2015-02-06 ENCOUNTER — Ambulatory Visit (INDEPENDENT_AMBULATORY_CARE_PROVIDER_SITE_OTHER): Payer: 59 | Admitting: Physician Assistant

## 2015-02-06 ENCOUNTER — Encounter: Payer: Self-pay | Admitting: Physician Assistant

## 2015-02-06 VITALS — BP 132/82 | HR 84 | Temp 98.6°F | Resp 16 | Ht 65.5 in | Wt 171.0 lb

## 2015-02-06 DIAGNOSIS — K5900 Constipation, unspecified: Secondary | ICD-10-CM

## 2015-02-06 DIAGNOSIS — R059 Cough, unspecified: Secondary | ICD-10-CM

## 2015-02-06 DIAGNOSIS — R05 Cough: Secondary | ICD-10-CM

## 2015-02-06 NOTE — Patient Instructions (Signed)
HOW TO TREAT VIRAL COUGH AND COLD SYMPTOMS:  -Symptoms usually last at least 1 week with the worst symptoms being around day 4.  - colds usually start with a sore throat and end with a cough, and the cough can take 2 weeks to get better.  -No antibiotics are needed for colds, flu, sore throats, cough, bronchitis UNLESS symptoms are longer than 7 days OR if you are getting better then get drastically worse.  -There are a lot of combination medications (Dayquil, Nyquil, Vicks 44, tyelnol cold and sinus, ETC). Please look at the ingredients on the back so that you are treating the correct symptoms and not doubling up on medications/ingredients.    Medicines you can use  Nasal congestion  - pseudoephedrine (Sudafed)- behind the counter, do not use if you have high blood pressure, medicine that have -D in them.  - phenylephrine (Sudafed PE) -Dextormethorphan + chlorpheniramine (Coridcidin HBP)- okay if you have high blood pressure -Oxymetazoline (Afrin) nasal spray- LIMIT to 3 days -Saline nasal spray -Neti pot (used distilled or bottled water)  Ear pain/congestion  -pseudoephedrine (sudafed) - Nasonex/flonase nasal spray  Fever  -Acetaminophen (Tyelnol) -Ibuprofen (Advil, motrin, aleve)  Sore Throat  -Acetaminophen (Tyelnol) -Ibuprofen (Advil, motrin, aleve) -Drink a lot of water -Gargle with salt water - Rest your voice (don't talk) -Throat sprays -Cough drops  Body Aches  -Acetaminophen (Tyelnol) -Ibuprofen (Advil, motrin, aleve)  Headache  -Acetaminophen (Tyelnol) -Ibuprofen (Advil, motrin, aleve) - Exedrin, Exedrin Migraine  Allergy symptoms (cough, sneeze, runny nose, itchy eyes) -Claritin or loratadine cheapest but likely the weakest  -Zyrtec or certizine at night because it can make you sleepy -The strongest is allegra or fexafinadine  Cheapest at [employer], [employer], [employer]  Cough  -Dextromethorphan (Delsym)- medicine that has DM in it -Guafenesin  (Mucinex/Robitussin) - cough drops - drink lots of water  Chest Congestion  -Guafenesin (Mucinex/Robitussin)  Red Itchy Eyes  - Naphcon-A  Upset Stomach  - Bland diet (nothing spicy, greasy, fried, and high acid foods like tomatoes, oranges, berries) -OKAY- cereal, bread, soup, crackers, rice -Eat smaller more frequent meals -reduce caffeine, no alcohol -Loperamide (Imodium-AD) if diarrhea -Prevacid for heart burn  General health when sick  -Hydration -wash your hands frequently -keep surfaces clean -change pillow cases and sheets often -Get fresh air but do not exercise strenuously -Vitamin D, double up on it - Vitamin C -Zinc  Please do the following for a bowel purge.  Purchase a bottle of Miralax over the counter as well as a box of 5 mg dulcolax tablets. Take 4 dulcolax tablets. Wait 1 hour. You will then drink 6-8 capfuls of Miralax mixed in an adequate amount of water/juice/gatorade (you may choose which of these liquids to drink) over the next 2-3 hours. You should expect results within 1 to 6 hours after completing the bowel purge. Go to the er if you have severe AB pain, can not pass gas or stool in over 12 hours, can not hold down any food.    Constipation Constipation is when a person has fewer than three bowel movements a week, has difficulty having a bowel movement, or has stools that are dry, hard, or larger than normal. As people grow older, constipation is more common. If you try to fix constipation with medicines that make you have a bowel movement (laxatives), the problem may get worse. Long-term laxative use may cause the muscles of the colon to become weak. A low-fiber diet, not taking in enough fluids, and taking  certain medicines may make constipation worse.  CAUSES   Certain medicines, such as antidepressants, pain medicine, iron supplements, antacids, and water pills.   Certain diseases, such as diabetes, irritable bowel syndrome (IBS), thyroid  disease, or depression.   Not drinking enough water.   Not eating enough fiber-rich foods.   Stress or travel.   Lack of physical activity or exercise.   Ignoring the urge to have a bowel movement.   Using laxatives too much.  SIGNS AND SYMPTOMS   Having fewer than three bowel movements a week.   Straining to have a bowel movement.   Having stools that are hard, dry, or larger than normal.   Feeling full or bloated.   Pain in the lower abdomen.   Not feeling relief after having a bowel movement.  DIAGNOSIS  Your health care provider will take a medical history and perform a physical exam. Further testing may be done for severe constipation. Some tests may include:  A barium enema X-ray to examine your rectum, colon, and, sometimes, your small intestine.   A sigmoidoscopy to examine your lower colon.   A colonoscopy to examine your entire colon. TREATMENT  Treatment will depend on the severity of your constipation and what is causing it. Some dietary treatments include drinking more fluids and eating more fiber-rich foods. Lifestyle treatments may include regular exercise. If these diet and lifestyle recommendations do not help, your health care provider may recommend taking over-the-counter laxative medicines to help you have bowel movements. Prescription medicines may be prescribed if over-the-counter medicines do not work.  HOME CARE INSTRUCTIONS   Eat foods that have a lot of fiber, such as fruits, vegetables, whole grains, and beans.  Limit foods high in fat and processed sugars, such as french fries, hamburgers, cookies, candies, and soda.   A fiber supplement may be added to your diet if you cannot get enough fiber from foods.   Drink enough fluids to keep your urine clear or pale yellow.   Exercise regularly or as directed by your health care provider.   Go to the restroom when you have the urge to go. Do not hold it.   Only take  over-the-counter or prescription medicines as directed by your health care provider. Do not take other medicines for constipation without talking to your health care provider first.  SEEK IMMEDIATE MEDICAL CARE IF:   You have bright red blood in your stool.   Your constipation lasts for more than 4 days or gets worse.   You have abdominal or rectal pain.   You have thin, pencil-like stools.   You have unexplained weight loss. MAKE SURE YOU:   Understand these instructions.  Will watch your condition.  Will get help right away if you are not doing well or get worse. Document Released: 08/29/2004 Document Revised: 12/06/2013 Document Reviewed: 09/12/2013 [facility] Patient Information 2015 [place], [place]. This information is not intended to replace advice given to you by your health care provider. Make sure you discuss any questions you have with your health care provider.

## 2015-02-06 NOTE — Progress Notes (Signed)
   Subjective:    Patient ID: Angelica Weeks, female    DOB: January 16, 1971, 44 y.o.   MRN: [id number]  HPI 44 y.o. 25 pack year smoking female with history of ADD, bipolar presents with 2 weeks of generalized symptoms. She complains of fatigue, muscle aches, fever 102, chills, cough with green mucus, epigastric pain with food and states that she has not had a BM or passed gas for 2 weeks however she is not in distress and has not had any AB surgeries. She has been having increased stressed due to her GM going to a NH.     Review of Systems  Constitutional: Positive for fever, chills, appetite change and fatigue. Negative for diaphoresis, activity change and unexpected weight change.  HENT: Positive for congestion, postnasal drip, rhinorrhea, sinus pressure and sore throat. Negative for dental problem, drooling, ear discharge, ear pain, facial swelling, hearing loss, mouth sores, nosebleeds, sneezing, tinnitus, trouble swallowing and voice change.   Eyes: Negative.   Respiratory: Positive for cough and wheezing. Negative for apnea, choking, chest tightness, shortness of breath and stridor.   Cardiovascular: Negative.   Gastrointestinal: Positive for nausea, vomiting (x 1 ), abdominal pain and constipation. Negative for diarrhea, blood in stool, abdominal distention, anal bleeding and rectal pain.  Genitourinary: Negative.   Musculoskeletal: Positive for myalgias. Negative for back pain, joint swelling, arthralgias, gait problem, neck pain and neck stiffness.  Skin: Negative.  Negative for rash.  Neurological: Negative.        Objective:   Physical Exam  Constitutional: She is oriented to person, place, and time. She appears well-developed and well-nourished.  HENT:  Head: Normocephalic and atraumatic.  Right Ear: External ear normal.  Left Ear: External ear normal.  Nose: Nose normal.  Mouth/Throat: Oropharynx is clear and moist.  Eyes: Conjunctivae are normal. Pupils are equal, round, and  reactive to light.  Neck: Normal range of motion. Neck supple.  Cardiovascular: Normal rate and regular rhythm.   Pulmonary/Chest: Effort normal and breath sounds normal.  Abdominal: Soft. Bowel sounds are normal. She exhibits no distension and no mass. There is tenderness (very mild tenderness diffuse). There is no rebound and no guarding.  Musculoskeletal: Normal range of motion.  Lymphadenopathy:    She has no cervical adenopathy.  Neurological: She is alert and oriented to person, place, and time. She has normal reflexes.  Skin: Skin is warm and dry.      Assessment & Plan:  Constipation- Increase fiber/ water intake, decrease caffeine, increase activity level, can add Miralax until BM soft. Laboratory tests per orders. Please go to the hospital if you have severe abdominal pain, vomiting, fever, CP, SOB.   URI- Discussed diagnosis and treatment of URI. Discussed the importance of avoiding unnecessary antibiotic therapy. Suggested symptomatic OTC remedies. Nasal saline spray for congestion. Nasal steroids per orders. Follow up as needed. follow up at CPE, declines labs and would prefer to avoid ABX   Future Appointments Date Time Provider Department Center  02/13/2015 11:00 AM Melissa Arlyss Queen Smith, PA-C [id number] None

## 2015-02-13 ENCOUNTER — Ambulatory Visit (INDEPENDENT_AMBULATORY_CARE_PROVIDER_SITE_OTHER): Payer: 59 | Admitting: Emergency Medicine

## 2015-02-13 ENCOUNTER — Encounter: Payer: Self-pay | Admitting: Emergency Medicine

## 2015-02-13 VITALS — BP 138/80 | HR 86 | Temp 98.2°F | Resp 16 | Ht 65.5 in | Wt 163.0 lb

## 2015-02-13 DIAGNOSIS — I10 Essential (primary) hypertension: Secondary | ICD-10-CM

## 2015-02-13 DIAGNOSIS — Z1212 Encounter for screening for malignant neoplasm of rectum: Secondary | ICD-10-CM

## 2015-02-13 DIAGNOSIS — Z0000 Encounter for general adult medical examination without abnormal findings: Secondary | ICD-10-CM

## 2015-02-13 DIAGNOSIS — R05 Cough: Secondary | ICD-10-CM

## 2015-02-13 DIAGNOSIS — R059 Cough, unspecified: Secondary | ICD-10-CM

## 2015-02-13 DIAGNOSIS — Z0001 Encounter for general adult medical examination with abnormal findings: Secondary | ICD-10-CM

## 2015-02-13 DIAGNOSIS — R239 Unspecified skin changes: Secondary | ICD-10-CM

## 2015-02-13 DIAGNOSIS — R6889 Other general symptoms and signs: Secondary | ICD-10-CM

## 2015-02-13 DIAGNOSIS — Z111 Encounter for screening for respiratory tuberculosis: Secondary | ICD-10-CM

## 2015-02-13 DIAGNOSIS — Z23 Encounter for immunization: Secondary | ICD-10-CM

## 2015-02-13 LAB — HEMOGLOBIN A1C
Hgb A1c MFr Bld: 5.6 % (ref <5.7)
Mean Plasma Glucose: 114 mg/dL (ref <117)

## 2015-02-13 LAB — CBC WITH DIFFERENTIAL/PLATELET
Basophils Absolute: 0.1 10*3/uL (ref 0.0–0.1)
Basophils Relative: 1 % (ref 0–1)
EOS ABS: 0.1 10*3/uL (ref 0.0–0.7)
Eosinophils Relative: 1 % (ref 0–5)
HCT: 37.7 % (ref 36.0–46.0)
HEMOGLOBIN: 12.6 g/dL (ref 12.0–15.0)
LYMPHS ABS: 2.7 10*3/uL (ref 0.7–4.0)
Lymphocytes Relative: 34 % (ref 12–46)
MCH: 32.1 pg (ref 26.0–34.0)
MCHC: 33.4 g/dL (ref 30.0–36.0)
MCV: 95.9 fL (ref 78.0–100.0)
MONOS PCT: 9 % (ref 3–12)
MPV: 8.9 fL (ref 8.6–12.4)
Monocytes Absolute: 0.7 10*3/uL (ref 0.1–1.0)
NEUTROS PCT: 55 % (ref 43–77)
Neutro Abs: 4.3 10*3/uL (ref 1.7–7.7)
Platelets: 421 10*3/uL — ABNORMAL HIGH (ref 150–400)
RBC: 3.93 MIL/uL (ref 3.87–5.11)
RDW: 13.1 % (ref 11.5–15.5)
WBC: 7.9 10*3/uL (ref 4.0–10.5)

## 2015-02-13 NOTE — Patient Instructions (Addendum)
Smoking Cessation, Tips for Success If you are ready to quit smoking, congratulations! You have chosen to help yourself be healthier. Cigarettes bring nicotine, tar, carbon monoxide, and other irritants into your body. Your lungs, heart, and blood vessels will be able to work better without these poisons. There are many different ways to quit smoking. Nicotine gum, nicotine patches, a nicotine inhaler, or nicotine nasal spray can help with physical craving. Hypnosis, support groups, and medicines help break the habit of smoking. WHAT THINGS CAN I DO TO MAKE QUITTING EASIER?  Here are some tips to help you quit for good:  Pick a date when you will quit smoking completely. Tell all of your friends and family about your plan to quit on that date.  Do not try to slowly cut down on the number of cigarettes you are smoking. Pick a quit date and quit smoking completely starting on that day.  Throw away all cigarettes.   Clean and remove all ashtrays from your home, work, and car.  On a card, write down your reasons for quitting. Carry the card with you and read it when you get the urge to smoke.  Cleanse your body of nicotine. Drink enough water and fluids to keep your urine clear or pale yellow. Do this after quitting to flush the nicotine from your body.  Learn to predict your moods. Do not let a bad situation be your excuse to have a cigarette. Some situations in your life might tempt you into wanting a cigarette.  Never have "just one" cigarette. It leads to wanting another and another. Remind yourself of your decision to quit.  Change habits associated with smoking. If you smoked while driving or when feeling stressed, try other activities to replace smoking. Stand up when drinking your coffee. Brush your teeth after eating. Sit in a different chair when you read the paper. Avoid alcohol while trying to quit, and try to drink fewer caffeinated beverages. Alcohol and caffeine may urge you to  smoke.  Avoid foods and drinks that can trigger a desire to smoke, such as sugary or spicy foods and alcohol.  Ask people who smoke not to smoke around you.  Have something planned to do right after eating or having a cup of coffee. For example, plan to take a walk or exercise.  Try a relaxation exercise to calm you down and decrease your stress. Remember, you may be tense and nervous for the first 2 weeks after you quit, but this will pass.  Find new activities to keep your hands busy. Play with a pen, coin, or rubber band. Doodle or draw things on paper.  Brush your teeth right after eating. This will help cut down on the craving for the taste of tobacco after meals. You can also try mouthwash.   Use oral substitutes in place of cigarettes. Try using lemon drops, carrots, cinnamon sticks, or chewing gum. Keep them handy so they are available when you have the urge to smoke.  When you have the urge to smoke, try deep breathing.  Designate your home as a nonsmoking area.  If you are a heavy smoker, ask your health care provider about a prescription for nicotine chewing gum. It can ease your withdrawal from nicotine.  Reward yourself. Set aside the cigarette money you save and buy yourself something nice.  Look for support from others. Join a support group or smoking cessation program. Ask someone at home or at work to help you with your plan   to quit smoking.  Always ask yourself, "Do I need this cigarette or is this just a reflex?" Tell yourself, "Today, I choose not to smoke," or "I do not want to smoke." You are reminding yourself of your decision to quit.  Do not replace cigarette smoking with electronic cigarettes (commonly called e-cigarettes). The safety of e-cigarettes is unknown, and some may contain harmful chemicals.  If you relapse, do not give up! Plan ahead and think about what you will do the next time you get the urge to smoke. HOW WILL I FEEL WHEN I QUIT SMOKING? You  may have symptoms of withdrawal because your body is used to nicotine (the addictive substance in cigarettes). You may crave cigarettes, be irritable, feel very hungry, cough often, get headaches, or have difficulty concentrating. The withdrawal symptoms are only temporary. They are strongest when you first quit but will go away within 10-14 days. When withdrawal symptoms occur, stay in control. Think about your reasons for quitting. Remind yourself that these are signs that your body is healing and getting used to being without cigarettes. Remember that withdrawal symptoms are easier to treat than the major diseases that smoking can cause.  Even after the withdrawal is over, expect periodic urges to smoke. However, these cravings are generally short lived and will go away whether you smoke or not. Do not smoke! WHAT RESOURCES ARE AVAILABLE TO HELP ME QUIT SMOKING? Your health care provider can direct you to community resources or hospitals for support, which may include:  Group support.  Education.  Hypnosis.  Therapy. Document Released: 08/29/2004 Document Revised: 04/17/2014 Document Reviewed: 05/19/2013  Cough, Adult  A cough is a reflex. It helps you clear your throat and airways. A cough can help heal your body. A cough can last 2 or 3 weeks (acute) or may last more than 8 weeks (chronic). Some common causes of a cough can include an infection, allergy, or a cold. HOME CARE  Only take medicine as told by your doctor.  If given, take your medicines (antibiotics) as told. Finish them even if you start to feel better.  Use a cold steam vaporizer or humidifier in your home. This can help loosen thick spit (secretions).  Sleep so you are almost sitting up (semi-upright). Use pillows to do this. This helps reduce coughing.  Rest as needed.  Stop smoking if you smoke. GET HELP RIGHT AWAY IF:  You have yellowish-white fluid (pus) in your thick spit.  Your cough gets worse.  Your  medicine does not reduce coughing, and you are losing sleep.  You cough up blood.  You have trouble breathing.  Your pain gets worse and medicine does not help.  You have a fever. MAKE SURE YOU:   Understand these instructions.  Will watch your condition.  Will get help right away if you are not doing well or get worse. Document Released: 08/14/2011 Document Revised: 04/17/2014 Document Reviewed: 08/14/2011 [facility] Patient Information 2015 [place], [place]. This information is not intended to replace advice given to you by your health care provider. Make sure you discuss any questions you have with your health care provider.  ExitCare Patient Information 2015 [place], [place]. This information is not intended to replace advice given to you by your health care provider. Make sure you discuss any questions you have with your health care provider.

## 2015-02-13 NOTE — Progress Notes (Signed)
Subjective:    Patient ID: Angelica Weeks, female    DOB: 1971/11/24, 44 y.o.   MRN: [id number]  HPI Comments: 43yo WF CPE. She has NEG PMH for HTN/Cholesterol/ DM and is adopted and unaware of family history.  She has had cough and fever on/off for over 4 weeks. She has smoked for over 20 years 1 ppd or less. She has tried OTC with only short term relief. She denies any production of color.  She is followed by Psych and notes mood is better controlled on current medications. She denies SI/HI.   Lab Results      Component                Value               Date                      WBC                      6.6                 01/21/2014                HGB                      12.7                01/21/2014                HCT                      37.0                01/21/2014                PLT                      256                 01/21/2014                GLUCOSE                  83                  01/21/2014                NA                       141                 01/21/2014                K                        4.4                 01/21/2014                CL                       106                 01/21/2014                CREATININE  0.62                01/21/2014                BUN                      9                   01/21/2014                CO2                      21                  01/21/2014                Medication List       This list is accurate as of: 02/13/15 11:59 PM.  Always use your most recent med list.               amphetamine-dextroamphetamine 20 MG tablet  Commonly known as:  ADDERALL  Take 20 mg by mouth 3 (three) times daily.     lamoTRIgine 200 MG tablet  Commonly known as:  LAMICTAL  Take 300 mg by mouth at bedtime.     traZODone 100 MG tablet  Commonly known as:  DESYREL  Take 100 mg by mouth daily.     Vortioxetine HBr 20 MG Tabs  Commonly known as:  BRINTELLIX  Take 20 mg by mouth daily.     XANAX 1 MG  tablet  Generic drug:  ALPRAZolam  Take 1 mg by mouth 3 (three) times daily as needed for anxiety.       No Known Allergies   Past Medical History  Diagnosis Date  . ADD (attention deficit disorder)   . Depression   . Bipolar 1 disorder, depressed   . Cellulitis of left eyelid 01/2014   Past Surgical History  Procedure Laterality Date  . No past surgeries     History  Substance Use Topics  . Smoking status: Current Every Day Smoker -- 1.00 packs/day for 20 years    Types: Cigarettes  . Smokeless tobacco: Never Used  . Alcohol Use: Yes     Comment: occasional   Family History  Problem Relation Age of Onset  . Adopted: Yes    MAINTENANCE: Colonoscopy:n/a Pap/ Pelvic:2014 WNL WUJ:WJXBJYN/EYE:Glasses/ contacts overdue 5 years Dentist: q 6 month LMP: 3 weeks ago  IMMUNIZATIONS: Tdap:overdue Pneumovax:n/a Zostavax:n/a Influenza: declines  Patient Care Team: Lucky CowboyWilliam McKeown, MD as PCP - General (Internal Medicine) Madilyn Hookhristopher L Groat, MD as Consulting Physician (Optometry) Mitchel HonourMegan Morris, DO as Consulting Physician (Obstetrics and Gynecology) Gerhard MunchParish Ann McKinney, MD as Consulting Physician (Psychiatry) Garner Nashaniels, (Dentist)  Review of Systems  Constitutional: Positive for fever.  HENT: Positive for congestion.   Respiratory: Positive for cough. Negative for shortness of breath.   Cardiovascular: Negative for chest pain.  Psychiatric/Behavioral: Negative for suicidal ideas.  All other systems reviewed and are negative.  BP 138/80 mmHg  Pulse 86  Temp(Src) 98.2 F (36.8 C) (Temporal)  Resp 16  Ht 5' 5.5" (1.664 m)  Wt 163 lb (73.936 kg)  BMI 26.70 kg/m2  LMP 01/30/2015     Objective:   Physical Exam  Constitutional: She is oriented to person, place, and time. She appears well-developed and well-nourished. No distress.  HENT:  Head: Normocephalic.  Nose: Nose normal.  Mouth/Throat: Oropharynx  is clear and moist.  Eyes: Conjunctivae and EOM are normal. Pupils are  equal, round, and reactive to light. No scleral icterus.  Neck: Normal range of motion. Neck supple. No JVD present. No tracheal deviation present. No thyromegaly present.  Cardiovascular: Normal rate, regular rhythm, normal heart sounds and intact distal pulses.   Pulmonary/Chest: Effort normal and breath sounds normal.  Abdominal: Soft. Bowel sounds are normal. She exhibits no distension and no mass. There is no tenderness.  Genitourinary:  Def GYN  Musculoskeletal: Normal range of motion. She exhibits no edema or tenderness.  Lymphadenopathy:    She has no cervical adenopathy.  Neurological: She is alert and oriented to person, place, and time. She has normal reflexes. No cranial nerve deficit. She exhibits normal muscle tone. Coordination normal.  Skin: Skin is warm and dry. No rash noted. No erythema.  Left of mid chest dark flat irregular nevi  Psychiatric: She has a normal mood and affect. Her behavior is normal. Judgment and thought content normal.  Nursing note and vitals reviewed.     EKG NSCSPT WNL  Assessment & Plan:  1. CPE- Update screening labs/ History/ Immunizations/ Testing as needed. Advised healthy diet, QD exercise, increase H20 and continue RX/ Vitamins AD.  2.  Irreg Nevi- monitor for any change, call if occurs for removal   3. Cough with Tobacco HX- Check labs and CXR. Tobacco Dep- advised cessation techniques and need for d/c to decrease Risk   4. Mood disorder- keep f/u PSYCH, continue RX AD

## 2015-02-14 ENCOUNTER — Other Ambulatory Visit: Payer: 59

## 2015-02-14 DIAGNOSIS — Z0000 Encounter for general adult medical examination without abnormal findings: Secondary | ICD-10-CM

## 2015-02-14 LAB — LIPID PANEL
Cholesterol: 129 mg/dL (ref 0–200)
HDL: 62 mg/dL (ref >=46)
LDL CALC: 51 mg/dL (ref 0–99)
Total CHOL/HDL Ratio: 2.1 Ratio
Triglycerides: 82 mg/dL (ref <150)
VLDL: 16 mg/dL (ref 0–40)

## 2015-02-14 LAB — INSULIN, FASTING: Insulin fasting, serum: 5.3 u[IU]/mL (ref 2.0–19.6)

## 2015-02-14 LAB — HEPATIC FUNCTION PANEL
ALBUMIN: 4.2 g/dL (ref 3.5–5.2)
ALK PHOS: 82 U/L (ref 39–117)
ALT: 11 U/L (ref 0–35)
AST: 12 U/L (ref 0–37)
Bilirubin, Direct: 0.1 mg/dL (ref 0.0–0.3)
Indirect Bilirubin: 0.2 mg/dL (ref 0.2–1.2)
TOTAL PROTEIN: 6.8 g/dL (ref 6.0–8.3)
Total Bilirubin: 0.3 mg/dL (ref 0.2–1.2)

## 2015-02-14 LAB — BASIC METABOLIC PANEL WITH GFR
BUN: 9 mg/dL (ref 6–23)
CHLORIDE: 102 meq/L (ref 96–112)
CO2: 28 meq/L (ref 19–32)
Calcium: 9.5 mg/dL (ref 8.4–10.5)
Creat: 0.64 mg/dL (ref 0.50–1.10)
GFR, Est African American: >89 mL/min
GFR, Est Non African American: >89 mL/min
Glucose, Bld: 80 mg/dL (ref 70–99)
POTASSIUM: 4.3 meq/L (ref 3.5–5.3)
SODIUM: 139 meq/L (ref 135–145)

## 2015-02-14 LAB — TSH: TSH: 1.235 u[IU]/mL (ref 0.350–4.500)

## 2015-02-14 LAB — VITAMIN D 25 HYDROXY (VIT D DEFICIENCY, FRACTURES): VIT D 25 HYDROXY: 18 ng/mL — AB (ref 30–100)

## 2015-02-14 LAB — MAGNESIUM: MAGNESIUM: 2.1 mg/dL (ref 1.5–2.5)

## 2015-02-15 LAB — URINALYSIS, ROUTINE W REFLEX MICROSCOPIC
Glucose, UA: NEGATIVE mg/dL
Hgb urine dipstick: NEGATIVE
Leukocytes, UA: NEGATIVE
NITRITE: NEGATIVE
PH: 6 (ref 5.0–8.0)
Protein, ur: NEGATIVE mg/dL
SPECIFIC GRAVITY, URINE: 1.027 (ref 1.005–1.030)
UROBILINOGEN UA: 0.2 mg/dL (ref 0.0–1.0)

## 2015-02-16 LAB — TB SKIN TEST
Induration: 0 mm
TB Skin Test: NEGATIVE

## 2016-02-18 ENCOUNTER — Encounter: Payer: Self-pay | Admitting: Emergency Medicine

## 2016-12-22 DIAGNOSIS — F3132 Bipolar disorder, current episode depressed, moderate: Secondary | ICD-10-CM | POA: Diagnosis not present

## 2017-03-03 DIAGNOSIS — F3132 Bipolar disorder, current episode depressed, moderate: Secondary | ICD-10-CM | POA: Diagnosis not present

## 2017-04-02 DIAGNOSIS — F3132 Bipolar disorder, current episode depressed, moderate: Secondary | ICD-10-CM | POA: Diagnosis not present

## 2017-04-11 NOTE — Patient Instructions (Signed)

## 2017-04-11 NOTE — Progress Notes (Signed)
[facility] Angelica Weeks, M.D.      Angelica Weeks. Angelica Weeks, P.A.-C [facility]                [address]                [place], [place]. [address] Telephone [phone] Telefax [phone]  Annual Screening/Preventative Visit & Comprehensive Evaluation &  Examination     This very nice 46 y.o. [name] presents for a Screening/Preventative Visit & comprehensive evaluation and management. Patients problems include hx/o ADD, Bipolar disorder and Depression. To day she also c/o EG burning discomfort and water brash type reflux with typical dietary triggers. She has tried OTC with Nexium with improvement, but w/o complete relief.       Patient is screened expectantly for labile elevated BP.  Patient's BP has been controlled at home and patient denies any cardiac symptoms as chest pain, palpitations, shortness of breath, dizziness or ankle swelling. Today's BP is at goal - 120/76.      Patient's lipids have been controlled with diet. Last lipids were at goal: Lab Results  Component Value Date   CHOL 129 02/13/2015   HDL 62 02/13/2015   LDLCALC 51 02/13/2015   TRIG 82 02/13/2015   CHOLHDL 2.1 02/13/2015      Patient is sl overweight (BMI 26+) and she screened expectantly for prediabetes and patient denies reactive hypoglycemic symptoms, visual blurring, diabetic polys, or paresthesias. Last A1c was at goal: Lab Results  Component Value Date   HGBA1C 5.6 02/13/2015      Finally, patient has history of Vitamin D Deficiency and last Vitamin D was very low: Lab Results  Component Value Date   VD25OH 18 (L) 02/13/2015   Current Outpatient Prescriptions on File Prior to Visit  Medication Sig  . ALPRAZolam  1 MG tablet Take 1 mg by mouth 3 (three) times daily as needed for anxiety.   . ADDERALL 20 MG tablet Take 20 mg by mouth 3 (three) times daily.  Marland Kitchen LAMICTAL 200 MG tablet Take 300 mg by mouth at bedtime.  Marland Kitchen BRINTELLIX 20  MG TABS Take 20 mg by mouth daily.   No Known Allergies  Past Medical History:  Diagnosis Date  . ADD (attention deficit disorder)   . Bipolar 1 disorder, depressed   . Cellulitis of left eyelid 01/2014  . Depression    Health Maintenance  Topic Date Due  . HIV Screening  06/13/1988  . PAP SMEAR  12/16/2015  . INFLUENZA VACCINE  07/15/2017  . TETANUS/TDAP  02/12/2025   Immunization History  Administered Date(s) Administered  . PPD Test 02/13/2015  . Tdap 02/13/2015   . NO PAST SURGERIES     Family History  Problem Relation Age of Onset  . Adopted: Yes   Social History  Substance Use Topics  . Smoking status: Current Every Day Smoker    Packs/day: 1.00    Years: 20.00    Types: Cigarettes  . Smokeless tobacco: Never Used  . Alcohol use Yes     Comment: occasional    ROS Constitutional: Denies fever, chills, weight loss/gain, headaches, insomnia,  night sweats, and change in appetite. Does c/o fatigue. Eyes: Denies redness, blurred vision, diplopia, discharge, itchy, watery eyes.  ENT: Denies discharge, congestion, post nasal drip, epistaxis, sore throat, earache, hearing loss, dental pain, Tinnitus, Vertigo, Sinus pain, snoring.  Cardio: Denies chest pain, palpitations, irregular heartbeat, syncope, dyspnea, diaphoresis, orthopnea, PND, claudication,  edema Respiratory: denies cough, dyspnea, DOE, pleurisy, hoarseness, laryngitis, wheezing.  Gastrointestinal: Denies dysphagia, heartburn, reflux, water brash, pain, cramps, nausea, vomiting, bloating, diarrhea, constipation, hematemesis, melena, hematochezia, jaundice, hemorrhoids Genitourinary: Denies dysuria, frequency, urgency, nocturia, hesitancy, discharge, hematuria, flank pain Breast: Breast lumps, nipple discharge, bleeding.  Musculoskeletal: Denies arthralgia, myalgia, stiffness, Jt. Swelling, pain, limp, and strain/sprain. Denies falls. Skin: Denies puritis, rash, hives, warts, acne, eczema, changing in skin  lesion Neuro: No weakness, tremor, incoordination, spasms, paresthesia, pain Psychiatric: Denies confusion, memory loss, sensory loss. Denies Depression. Endocrine: Denies change in weight, skin, hair change, nocturia, and paresthesia, diabetic polys, visual blurring, hyper / hypo glycemic episodes.  Heme/Lymph: No excessive bleeding, bruising, enlarged lymph nodes.  Physical Exam  BP 120/76   Pulse 88   Temp 97.3 F (36.3 C)   Resp 16   Ht  (1.676 m)   Wt 166 lb 12.8 oz (75.7 kg)   BMI 26.92 kg/m   General Appearance: Well nourished, well groomed and in no apparent distress.  Eyes: PERRLA, EOMs, conjunctiva no swelling or erythema, normal fundi and vessels. Sinuses: No frontal/maxillary tenderness ENT/Mouth: EACs patent / TMs  nl. Nares clear without erythema, swelling, mucoid exudates. Oral hygiene is good. No erythema, swelling, or exudate. Tongue normal, non-obstructing. Tonsils not swollen or erythematous. Hearing normal.  Neck: Supple, thyroid normal. No bruits, nodes or JVD. Respiratory: Respiratory effort normal.  BS equal and clear bilateral without rales, rhonci, wheezing or stridor. Cardio: Heart sounds are normal with regular rate and rhythm and no murmurs, rubs or gallops. Peripheral pulses are normal and equal bilaterally without edema. No aortic or femoral bruits. Chest: symmetric with normal excursions and percussion. Breasts: Symmetric, without lumps, nipple discharge, retractions, or fibrocystic changes.  Abdomen: Flat, soft with bowel sounds active. Nontender, no guarding, rebound, hernias, masses, or organomegaly.  Lymphatics: Non tender without lymphadenopathy.  Musculoskeletal: Full ROM all peripheral extremities, joint stability, 5/5 strength, and normal gait. Skin: Warm and dry without rashes, lesions, cyanosis, clubbing or  ecchymosis.  Neuro: Cranial nerves intact, reflexes equal bilaterally. Normal muscle tone, no cerebellar symptoms. Sensation intact.   Pysch: Alert and oriented X 3, normal affect, Insight and Judgment appropriate.   Assessment and Plan  1. Annual Preventative Screening Examination  2. Elevated BP without diagnosis of hypertension  - EKG 12-Lead - Urinalysis, Routine w reflex microscopic - CBC with Differential/Platelet - BASIC METABOLIC PANEL WITH GFR - Magnesium - TSH  3. Screening cholesterol level  - Hepatic function panel - Lipid panel - TSH  4. Abnormal glucose  - EKG 12-Lead - Hemoglobin A1c - Insulin, random  5. Vitamin D deficiency  - VITAMIN D 25 Hydroxy   6. Screening for ischemic heart disease  - EKG 12-Lead  7. Screening for colorectal cancer   8. Fatigue, unspecified   - Vitamin B12 - Iron and TIBC - CBC with Differential/Platelet  9. Medication management  - Urinalysis, Routine w reflex microscopic - CBC with Differential/Platelet - BASIC METABOLIC PANEL WITH GFR - Hepatic function panel - Magnesium - Lipid panel - TSH - Hemoglobin A1c - Insulin, random - VITAMIN D 25 Hydroxy   10. Cough  - DG Chest 2 View; Future  11. Gastroesophageal reflux disease  - educated in anti-dyspeptic diet in detail.   - esomeprazole (NEXIUM) 40 MG capsule; Take 1 cap daily for indigestion and heart burn  Dispense: 90 capsule; Refill: 1  12. Screening examination for pulmonary tuberculosis  - PPD  Patient was counseled in prudent diet to achieve/maintain BMI less than 25 for weight control, BP monitoring, regular exercise and medications. Discussed med's effects and SE's. Screening labs and tests as requested with regular follow-up as recommended. Over 40 minutes of exam, counseling, chart review and high complex critical decision making was performed.

## 2017-04-13 ENCOUNTER — Encounter: Payer: Self-pay | Admitting: Internal Medicine

## 2017-04-13 ENCOUNTER — Other Ambulatory Visit: Payer: Self-pay | Admitting: Internal Medicine

## 2017-04-13 ENCOUNTER — Ambulatory Visit (INDEPENDENT_AMBULATORY_CARE_PROVIDER_SITE_OTHER): Payer: BLUE CROSS/BLUE SHIELD | Admitting: Internal Medicine

## 2017-04-13 VITALS — BP 120/76 | HR 88 | Temp 97.3°F | Resp 16 | Ht 66.0 in | Wt 166.8 lb

## 2017-04-13 DIAGNOSIS — Z1211 Encounter for screening for malignant neoplasm of colon: Secondary | ICD-10-CM

## 2017-04-13 DIAGNOSIS — R05 Cough: Secondary | ICD-10-CM

## 2017-04-13 DIAGNOSIS — Z111 Encounter for screening for respiratory tuberculosis: Secondary | ICD-10-CM | POA: Diagnosis not present

## 2017-04-13 DIAGNOSIS — R030 Elevated blood-pressure reading, without diagnosis of hypertension: Secondary | ICD-10-CM

## 2017-04-13 DIAGNOSIS — R7309 Other abnormal glucose: Secondary | ICD-10-CM

## 2017-04-13 DIAGNOSIS — R059 Cough, unspecified: Secondary | ICD-10-CM

## 2017-04-13 DIAGNOSIS — Z79899 Other long term (current) drug therapy: Secondary | ICD-10-CM | POA: Diagnosis not present

## 2017-04-13 DIAGNOSIS — Z13220 Encounter for screening for lipoid disorders: Secondary | ICD-10-CM

## 2017-04-13 DIAGNOSIS — Z0000 Encounter for general adult medical examination without abnormal findings: Secondary | ICD-10-CM

## 2017-04-13 DIAGNOSIS — Z136 Encounter for screening for cardiovascular disorders: Secondary | ICD-10-CM | POA: Diagnosis not present

## 2017-04-13 DIAGNOSIS — Z0001 Encounter for general adult medical examination with abnormal findings: Secondary | ICD-10-CM

## 2017-04-13 DIAGNOSIS — K219 Gastro-esophageal reflux disease without esophagitis: Secondary | ICD-10-CM

## 2017-04-13 DIAGNOSIS — Z1212 Encounter for screening for malignant neoplasm of rectum: Secondary | ICD-10-CM

## 2017-04-13 DIAGNOSIS — E559 Vitamin D deficiency, unspecified: Secondary | ICD-10-CM | POA: Diagnosis not present

## 2017-04-13 DIAGNOSIS — R5383 Other fatigue: Secondary | ICD-10-CM

## 2017-04-13 MED ORDER — ESOMEPRAZOLE MAGNESIUM 40 MG PO CPDR: DELAYED_RELEASE_CAPSULE | ORAL | 1 refills | Status: DC

## 2017-04-14 LAB — HEMOGLOBIN A1C
Hgb A1c MFr Bld: 4.7 % (ref <5.7)
Mean Plasma Glucose: 88 mg/dL

## 2017-04-14 LAB — CBC WITH DIFFERENTIAL/PLATELET
BASOS ABS: 0 {cells}/uL (ref 0–200)
BASOS PCT: 0 %
EOS ABS: 0 {cells}/uL — AB (ref 15–500)
EOS PCT: 0 %
HCT: 40.2 % (ref 35.0–45.0)
HEMOGLOBIN: 13.5 g/dL (ref 11.7–15.5)
LYMPHS ABS: 1386 {cells}/uL (ref 850–3900)
Lymphocytes Relative: 33 %
MCH: 33.1 pg — ABNORMAL HIGH (ref 27.0–33.0)
MCHC: 33.6 g/dL (ref 32.0–36.0)
MCV: 98.5 fL (ref 80.0–100.0)
MPV: 8.5 fL (ref 7.5–12.5)
Monocytes Absolute: 420 cells/uL (ref 200–950)
Monocytes Relative: 10 %
NEUTROS PCT: 57 %
Neutro Abs: 2394 cells/uL (ref 1500–7800)
PLATELETS: 255 10*3/uL (ref 140–400)
RBC: 4.08 MIL/uL (ref 3.80–5.10)
RDW: 14 % (ref 11.0–15.0)
WBC: 4.2 10*3/uL (ref 3.8–10.8)

## 2017-04-14 LAB — URINALYSIS, ROUTINE W REFLEX MICROSCOPIC
Bilirubin Urine: NEGATIVE
GLUCOSE, UA: NEGATIVE
HGB URINE DIPSTICK: NEGATIVE
KETONES UR: NEGATIVE
Leukocytes, UA: NEGATIVE
Nitrite: NEGATIVE
Protein, ur: NEGATIVE
Specific Gravity, Urine: 1.02 (ref 1.001–1.035)
pH: 6 (ref 5.0–8.0)

## 2017-04-14 LAB — BASIC METABOLIC PANEL WITH GFR
BUN: 10 mg/dL (ref 7–25)
CO2: 28 mmol/L (ref 20–31)
Calcium: 9.6 mg/dL (ref 8.6–10.2)
Chloride: 102 mmol/L (ref 98–110)
Creat: 0.86 mg/dL (ref 0.50–1.10)
GFR, EST NON AFRICAN AMERICAN: 83 mL/min (ref >=60)
GFR, Est African American: >89 mL/min (ref >=60)
Glucose, Bld: 104 mg/dL — ABNORMAL HIGH (ref 65–99)
POTASSIUM: 4.6 mmol/L (ref 3.5–5.3)
SODIUM: 137 mmol/L (ref 135–146)

## 2017-04-14 LAB — HEPATIC FUNCTION PANEL
ALK PHOS: 71 U/L (ref 33–115)
ALT: 12 U/L (ref 6–29)
AST: 15 U/L (ref 10–30)
Albumin: 4.1 g/dL (ref 3.6–5.1)
BILIRUBIN DIRECT: 0.1 mg/dL (ref <=0.2)
BILIRUBIN INDIRECT: 0.1 mg/dL — AB (ref 0.2–1.2)
BILIRUBIN TOTAL: 0.2 mg/dL (ref 0.2–1.2)
Total Protein: 6.6 g/dL (ref 6.1–8.1)

## 2017-04-14 LAB — LIPID PANEL
CHOL/HDL RATIO: 2 ratio (ref <5.0)
Cholesterol: 169 mg/dL (ref <200)
HDL: 86 mg/dL (ref >50)
LDL CALC: 69 mg/dL (ref <100)
Triglycerides: 68 mg/dL (ref <150)
VLDL: 14 mg/dL (ref <30)

## 2017-04-14 LAB — IRON AND TIBC
%SAT: 20 % (ref 11–50)
IRON: 75 ug/dL (ref 40–190)
TIBC: 370 ug/dL (ref 250–450)
UIBC: 295 ug/dL (ref 125–400)

## 2017-04-14 LAB — TSH: TSH: 1.88 m[IU]/L

## 2017-04-14 LAB — MAGNESIUM: MAGNESIUM: 1.9 mg/dL (ref 1.5–2.5)

## 2017-04-14 LAB — INSULIN, RANDOM: Insulin: 12.5 u[IU]/mL (ref 2.0–19.6)

## 2017-04-14 LAB — VITAMIN D 25 HYDROXY (VIT D DEFICIENCY, FRACTURES): VIT D 25 HYDROXY: 27 ng/mL — AB (ref 30–100)

## 2017-04-14 LAB — VITAMIN B12: VITAMIN B 12: 342 pg/mL (ref 200–1100)

## 2017-05-26 DIAGNOSIS — F3132 Bipolar disorder, current episode depressed, moderate: Secondary | ICD-10-CM | POA: Diagnosis not present

## 2017-06-18 DIAGNOSIS — F332 Major depressive disorder, recurrent severe without psychotic features: Secondary | ICD-10-CM | POA: Diagnosis not present

## 2017-06-26 DIAGNOSIS — F332 Major depressive disorder, recurrent severe without psychotic features: Secondary | ICD-10-CM | POA: Diagnosis not present

## 2017-06-29 DIAGNOSIS — F332 Major depressive disorder, recurrent severe without psychotic features: Secondary | ICD-10-CM | POA: Diagnosis not present

## 2017-06-30 DIAGNOSIS — F332 Major depressive disorder, recurrent severe without psychotic features: Secondary | ICD-10-CM | POA: Diagnosis not present

## 2017-07-01 DIAGNOSIS — F332 Major depressive disorder, recurrent severe without psychotic features: Secondary | ICD-10-CM | POA: Diagnosis not present

## 2017-07-02 DIAGNOSIS — F332 Major depressive disorder, recurrent severe without psychotic features: Secondary | ICD-10-CM | POA: Diagnosis not present

## 2017-07-03 DIAGNOSIS — F332 Major depressive disorder, recurrent severe without psychotic features: Secondary | ICD-10-CM | POA: Diagnosis not present

## 2017-07-06 DIAGNOSIS — F332 Major depressive disorder, recurrent severe without psychotic features: Secondary | ICD-10-CM | POA: Diagnosis not present

## 2017-07-07 DIAGNOSIS — F332 Major depressive disorder, recurrent severe without psychotic features: Secondary | ICD-10-CM | POA: Diagnosis not present

## 2017-07-07 DIAGNOSIS — F3132 Bipolar disorder, current episode depressed, moderate: Secondary | ICD-10-CM | POA: Diagnosis not present

## 2017-07-08 DIAGNOSIS — F332 Major depressive disorder, recurrent severe without psychotic features: Secondary | ICD-10-CM | POA: Diagnosis not present

## 2017-07-09 DIAGNOSIS — F332 Major depressive disorder, recurrent severe without psychotic features: Secondary | ICD-10-CM | POA: Diagnosis not present

## 2017-07-10 DIAGNOSIS — F332 Major depressive disorder, recurrent severe without psychotic features: Secondary | ICD-10-CM | POA: Diagnosis not present

## 2017-07-13 DIAGNOSIS — F332 Major depressive disorder, recurrent severe without psychotic features: Secondary | ICD-10-CM | POA: Diagnosis not present

## 2017-07-14 DIAGNOSIS — F332 Major depressive disorder, recurrent severe without psychotic features: Secondary | ICD-10-CM | POA: Diagnosis not present

## 2017-07-15 DIAGNOSIS — F332 Major depressive disorder, recurrent severe without psychotic features: Secondary | ICD-10-CM | POA: Diagnosis not present

## 2017-07-16 DIAGNOSIS — F332 Major depressive disorder, recurrent severe without psychotic features: Secondary | ICD-10-CM | POA: Diagnosis not present

## 2017-07-17 DIAGNOSIS — F332 Major depressive disorder, recurrent severe without psychotic features: Secondary | ICD-10-CM | POA: Diagnosis not present

## 2017-07-20 DIAGNOSIS — F332 Major depressive disorder, recurrent severe without psychotic features: Secondary | ICD-10-CM | POA: Diagnosis not present

## 2017-07-21 DIAGNOSIS — F332 Major depressive disorder, recurrent severe without psychotic features: Secondary | ICD-10-CM | POA: Diagnosis not present

## 2017-07-22 DIAGNOSIS — F332 Major depressive disorder, recurrent severe without psychotic features: Secondary | ICD-10-CM | POA: Diagnosis not present

## 2017-07-23 DIAGNOSIS — F332 Major depressive disorder, recurrent severe without psychotic features: Secondary | ICD-10-CM | POA: Diagnosis not present

## 2017-07-24 DIAGNOSIS — F332 Major depressive disorder, recurrent severe without psychotic features: Secondary | ICD-10-CM | POA: Diagnosis not present

## 2017-07-27 DIAGNOSIS — F332 Major depressive disorder, recurrent severe without psychotic features: Secondary | ICD-10-CM | POA: Diagnosis not present

## 2017-07-28 DIAGNOSIS — F332 Major depressive disorder, recurrent severe without psychotic features: Secondary | ICD-10-CM | POA: Diagnosis not present

## 2017-07-29 DIAGNOSIS — F332 Major depressive disorder, recurrent severe without psychotic features: Secondary | ICD-10-CM | POA: Diagnosis not present

## 2017-07-30 DIAGNOSIS — F332 Major depressive disorder, recurrent severe without psychotic features: Secondary | ICD-10-CM | POA: Diagnosis not present

## 2017-07-31 DIAGNOSIS — F332 Major depressive disorder, recurrent severe without psychotic features: Secondary | ICD-10-CM | POA: Diagnosis not present

## 2017-08-03 DIAGNOSIS — F332 Major depressive disorder, recurrent severe without psychotic features: Secondary | ICD-10-CM | POA: Diagnosis not present

## 2017-08-04 DIAGNOSIS — F332 Major depressive disorder, recurrent severe without psychotic features: Secondary | ICD-10-CM | POA: Diagnosis not present

## 2017-08-05 DIAGNOSIS — F332 Major depressive disorder, recurrent severe without psychotic features: Secondary | ICD-10-CM | POA: Diagnosis not present

## 2017-08-06 DIAGNOSIS — F332 Major depressive disorder, recurrent severe without psychotic features: Secondary | ICD-10-CM | POA: Diagnosis not present

## 2017-08-10 DIAGNOSIS — F332 Major depressive disorder, recurrent severe without psychotic features: Secondary | ICD-10-CM | POA: Diagnosis not present

## 2017-08-12 DIAGNOSIS — L03811 Cellulitis of head [any part, except face]: Secondary | ICD-10-CM | POA: Diagnosis not present

## 2017-08-12 DIAGNOSIS — F332 Major depressive disorder, recurrent severe without psychotic features: Secondary | ICD-10-CM | POA: Diagnosis not present

## 2017-08-14 DIAGNOSIS — F332 Major depressive disorder, recurrent severe without psychotic features: Secondary | ICD-10-CM | POA: Diagnosis not present

## 2017-08-18 DIAGNOSIS — F332 Major depressive disorder, recurrent severe without psychotic features: Secondary | ICD-10-CM | POA: Diagnosis not present

## 2017-08-20 DIAGNOSIS — F332 Major depressive disorder, recurrent severe without psychotic features: Secondary | ICD-10-CM | POA: Diagnosis not present

## 2017-08-25 DIAGNOSIS — F332 Major depressive disorder, recurrent severe without psychotic features: Secondary | ICD-10-CM | POA: Diagnosis not present

## 2017-09-08 DIAGNOSIS — F3132 Bipolar disorder, current episode depressed, moderate: Secondary | ICD-10-CM | POA: Diagnosis not present

## 2017-10-16 ENCOUNTER — Other Ambulatory Visit: Payer: Self-pay | Admitting: Internal Medicine

## 2017-10-16 DIAGNOSIS — K219 Gastro-esophageal reflux disease without esophagitis: Secondary | ICD-10-CM

## 2017-11-10 DIAGNOSIS — H0100A Unspecified blepharitis right eye, upper and lower eyelids: Secondary | ICD-10-CM | POA: Diagnosis not present

## 2017-11-10 DIAGNOSIS — H04123 Dry eye syndrome of bilateral lacrimal glands: Secondary | ICD-10-CM | POA: Diagnosis not present

## 2017-11-10 DIAGNOSIS — H524 Presbyopia: Secondary | ICD-10-CM | POA: Diagnosis not present

## 2017-11-10 DIAGNOSIS — H0100B Unspecified blepharitis left eye, upper and lower eyelids: Secondary | ICD-10-CM | POA: Diagnosis not present

## 2017-11-11 NOTE — Progress Notes (Signed)
Assessment and Plan:  Angelica Weeks was seen today for headache and dizziness.  Diagnoses and all orders for this visit:  Tension headache Likely related to stress, possible contributing TMJ Recommended stress management, application of heat to shoulders, medical massage or acupuncture, will prescribe muscle relaxer to take instead of xanax, may continue with OTC analgesics Patient to call if symptoms not improving -     ondansetron (ZOFRAN) 4 MG tablet; Take 1 tablet (4 mg total) by mouth daily as needed for nausea or vomiting. -     cyclobenzaprine (FLEXERIL) 5 MG tablet; Take 1 tablet (5 mg total) by mouth 3 (three) times daily as needed for muscle spasms.  Will go to the ER if worsening headache, changes vision/speech, imbalance, weakness.  Further disposition pending results of labs. Discussed med's effects and SE's.   Over 20 minutes of exam, counseling, chart review, and critical decision making was performed.   Future Appointments  Date Time Provider Department Center  11/12/2017 10:45 AM Judd Gaudierorbett, Talaysia, NP [facility] None  05/04/2018 10:00 AM Lucky CowboyMcKeown, William, MD [facility] None    ------------------------------------------------------------------------------------------------------------------   HPI BP 114/74   Pulse 84   Temp 97.7 F (36.5 C)   Ht 5\' 6"  (1.676 m)   Wt 154 lb (69.9 kg)   LMP  (LMP Unknown)   SpO2 99%   BMI 24.86 kg/m   Angelica Weeks is a 46 y.o. female who complains of daily headaches ongoing for 1 month.  She reports a history of rare migraines; has not had one in over a year, reports this feels different. She reports onset typically in the afternoon - she describes pressure sensation like a band around her head - 7/10- she has been taking ibuprofen/aleve with a xanax (prescribed for anxiety) which has been helping. She reports they come on relatively quickly - but are relieved by rest and medications. She reports high stress lately - has been out of job.  She also reports history of TMJ and teeth grinding - has had a mouthguard but has lost it - reports she does feel she has been clenching a lot lately. She reports she has also had a mild sense of dizziness and was evaluated by ophthalmology and was cleared. She denies double vision, blurriness, vertigo, fever/chills,   She does endorse nausea with pain and a single episode of emesis.   No history of trauma or relevant imaging.    Past Medical History:  Diagnosis Date  . ADD (attention deficit disorder)   . Bipolar 1 disorder, depressed (HCC)   . Cellulitis of left eyelid 01/2014  . Depression      Allergies  Allergen Reactions  . Aspirin Other (See Comments)    Burns stomach    Current Outpatient Medications on File Prior to Visit  Medication Sig  . ALPRAZolam (XANAX) 1 MG tablet Take 1 mg by mouth 3 (three) times daily as needed for anxiety.   Marland Kitchen. amphetamine-dextroamphetamine (ADDERALL) 20 MG tablet Take 20 mg by mouth 3 (three) times daily.  Marland Kitchen. esomeprazole (NEXIUM) 40 MG capsule TAKE ONE CAPSULE BY MOUTH EVERY DAY  . lamoTRIgine (LAMICTAL) 200 MG tablet Take 300 mg by mouth at bedtime.  . traZODone (DESYREL) 150 MG tablet Take 150 mg by mouth at bedtime.  Marland Kitchen. escitalopram (LEXAPRO) 20 MG tablet TAKE ONE AND A HALF TABLETS BY MOUTH EVERY DAY  . Vortioxetine HBr (BRINTELLIX) 20 MG TABS Take 20 mg by mouth daily.   No current facility-administered medications on file prior to  visit.     ROS: Review of Systems  Constitutional: Negative for chills, diaphoresis, fever, malaise/fatigue and weight loss.  HENT: Negative for congestion, ear pain, hearing loss, sinus pain and tinnitus.   Eyes: Negative for blurred vision, double vision, photophobia, pain and redness.  Respiratory: Negative for cough, shortness of breath and wheezing.   Cardiovascular: Negative for chest pain and palpitations.  Gastrointestinal: Positive for nausea. Negative for abdominal pain, diarrhea and vomiting.   Musculoskeletal: Negative for back pain and neck pain.  Skin: Negative for rash.  Neurological: Positive for dizziness and headaches. Negative for tingling, tremors, sensory change, speech change, focal weakness, seizures, loss of consciousness and weakness.  Psychiatric/Behavioral: Negative for depression. The patient is nervous/anxious.      Physical Exam:  BP 114/74   Pulse 84   Temp 97.7 F (36.5 C)   Ht 5\' 6"  (1.676 m)   Wt 154 lb (69.9 kg)   LMP  (LMP Unknown)   SpO2 99%   BMI 24.86 kg/m   General Appearance: Well nourished, in no apparent distress. Eyes: PERRLA, EOMs, conjunctiva no swelling or erythema Sinuses: No Frontal/maxillary tenderness ENT/Mouth: Ext aud canals clear, TMs without erythema, bulging. No erythema, swelling, or exudate on post pharynx.  Tonsils not swollen or erythematous. Hearing normal.  Neck: Supple, thyroid normal.  Respiratory: Respiratory effort normal, BS equal bilaterally without rales, rhonchi, wheezing or stridor.  Cardio: RRR with no MRGs. Brisk peripheral pulses without edema.  Abdomen: Soft, + BS.  Non tender, no guarding, rebound, hernias, masses. Lymphatics: Non tender without lymphadenopathy.  Musculoskeletal: Full ROM, 5/5 strength, normal gait. Some tension through shoulders and neck.  Skin: Warm, dry without rashes, lesions, ecchymosis.  Neuro: Cranial nerves intact. Normal muscle tone, no cerebellar symptoms. Sensation intact.  Psych: Awake and oriented X 3, normal affect, Insight and Judgment appropriate.     Dan MakerAshley C Dajuan Turnley, NP 10:35 AM [facility]

## 2017-11-12 ENCOUNTER — Encounter: Payer: Self-pay | Admitting: Adult Health

## 2017-11-12 ENCOUNTER — Ambulatory Visit: Payer: BLUE CROSS/BLUE SHIELD | Admitting: Adult Health

## 2017-11-12 VITALS — BP 114/74 | HR 84 | Temp 97.7°F | Ht 66.0 in | Wt 154.0 lb

## 2017-11-12 DIAGNOSIS — G44209 Tension-type headache, unspecified, not intractable: Secondary | ICD-10-CM | POA: Diagnosis not present

## 2017-11-12 MED ORDER — ONDANSETRON HCL 4 MG PO TABS: 4.0000 mg | ORAL_TABLET | Freq: Every day | ORAL | 1 refills | Status: DC | PRN

## 2017-11-12 MED ORDER — CYCLOBENZAPRINE HCL 5 MG PO TABS: 5.0000 mg | ORAL_TABLET | Freq: Three times a day (TID) | ORAL | 0 refills | Status: DC | PRN

## 2017-11-12 NOTE — Patient Instructions (Signed)
Look into medical massage   [employer] - look on groupon for coupon if insurance won't cover  80-100 fluid ounces minimum  Please remember, common headache triggers are: sleep deprivation, dehydration, overheating, stress, hypoglycemia or skipping meals and blood sugar fluctuations, excessive pain medications or excessive alcohol use or caffeine withdrawal.   Some people have food triggers such as aged cheese, orange juice or chocolate, especially dark chocolate, or MSG (monosodium glutamate). Try to avoid these headache triggers as much possible.   It may be helpful to keep a headache diary to figure out what makes your headaches worse or brings them on and what alleviates them. Some people report headache onset after exercise but studies have shown that regular exercise may actually prevent headaches from coming. If you have exercise-induced headaches, please make sure that you drink plenty of fluid before and after exercising and that you do not over do it and do not overheat.  Also you can try CoQ10 100mg  TID or B2 400mg  a day as prevention.   Also there is such a thing called rebound headache from over use of acute medications.  Please do not use rescue or acute medications more than 10 days a month or more than 3 days per week, this can cause a withdrawal and a rebound headache.   Please go to the ER if there is weakness, thunderclap headache, visual changes, or any concerning factors    Tension Headache A tension headache is a feeling of pain, pressure, or aching that is often felt over the front and sides of the head. The pain can be dull, or it can feel tight (constricting). Tension headaches are not normally associated with nausea or vomiting, and they do not get worse with physical activity. Tension headaches can last from 30 minutes to several days. This is the most common type of headache. CAUSES The exact cause of this condition is not known. Tension headaches often begin  after stress, anxiety, or depression. Other triggers may include:  Alcohol.  Too much caffeine, or caffeine withdrawal.  Respiratory infections, such as colds, flu, or sinus infections.  Dental problems or teeth clenching.  Fatigue.  Holding your head and neck in the same position for a long period of time, such as while using a computer.  Smoking. SYMPTOMS Symptoms of this condition include:  A feeling of pressure around the head.  Dull, aching head pain.  Pain felt over the front and sides of the head.  Tenderness in the muscles of the head, neck, and shoulders. DIAGNOSIS This condition may be diagnosed based on your symptoms and a physical exam. Tests may be done, such as a CT scan or an MRI of your head. These tests may be done if your symptoms are severe or unusual. TREATMENT This condition may be treated with lifestyle changes and medicines to help relieve symptoms. HOME CARE INSTRUCTIONS Managing Pain  Take over-the-counter and prescription medicines only as told by your health care provider.  Lie down in a dark, quiet room when you have a headache.  If directed, apply ice to the head and neck area: ? Put ice in a plastic bag. ? Place a towel between your skin and the bag. ? Leave the ice on for 20 minutes, 2-3 times per day.  Use a heating pad or a hot shower to apply heat to the head and neck area as told by your health care provider. Eating and Drinking  Eat meals on a regular schedule.  Limit alcohol use.  Decrease your caffeine intake, or stop using caffeine. General Instructions  Keep all follow-up visits as told by your health care provider. This is important.  Keep a headache journal to help find out what may trigger your headaches. For example, write down: ? What you eat and drink. ? How much sleep you get. ? Any change to your diet or medicines.  Try massage or other relaxation techniques.  Limit stress.  Sit up straight, and avoid  tensing your muscles.  Do not use tobacco products, including cigarettes, chewing tobacco, or e-cigarettes. If you need help quitting, ask your health care provider.  Exercise regularly as told by your health care provider.  Get 7-9 hours of sleep, or the amount recommended by your health care provider. SEEK MEDICAL CARE IF:  Your symptoms are not helped by medicine.  You have a headache that is different from what you normally experience.  You have nausea or you vomit.  You have a fever. SEEK IMMEDIATE MEDICAL CARE IF:  Your headache becomes severe.  You have repeated vomiting.  You have a stiff neck.  You have a loss of vision.  You have problems with speech.  You have pain in your eye or ear.  You have muscular weakness or loss of muscle control.  You lose your balance or you have trouble walking.  You feel faint or you pass out.  You have confusion. This information is not intended to replace advice given to you by your health care provider. Make sure you discuss any questions you have with your health care provider. Document Released: 12/01/2005 Document Revised: 08/22/2015 Document Reviewed: 03/26/2015 Elsevier Interactive Patient Education  2017 [employer]

## 2017-11-15 DIAGNOSIS — L0211 Cutaneous abscess of neck: Secondary | ICD-10-CM | POA: Diagnosis not present

## 2017-11-17 DIAGNOSIS — L0211 Cutaneous abscess of neck: Secondary | ICD-10-CM | POA: Diagnosis not present

## 2017-11-26 DIAGNOSIS — F332 Major depressive disorder, recurrent severe without psychotic features: Secondary | ICD-10-CM | POA: Diagnosis not present

## 2017-12-01 DIAGNOSIS — F3132 Bipolar disorder, current episode depressed, moderate: Secondary | ICD-10-CM | POA: Diagnosis not present

## 2018-01-17 ENCOUNTER — Other Ambulatory Visit: Payer: Self-pay | Admitting: Internal Medicine

## 2018-01-17 DIAGNOSIS — K219 Gastro-esophageal reflux disease without esophagitis: Secondary | ICD-10-CM

## 2018-01-26 DIAGNOSIS — F900 Attention-deficit hyperactivity disorder, predominantly inattentive type: Secondary | ICD-10-CM | POA: Diagnosis not present

## 2018-01-26 DIAGNOSIS — F3132 Bipolar disorder, current episode depressed, moderate: Secondary | ICD-10-CM | POA: Diagnosis not present

## 2018-01-26 DIAGNOSIS — F411 Generalized anxiety disorder: Secondary | ICD-10-CM | POA: Diagnosis not present

## 2018-02-20 ENCOUNTER — Other Ambulatory Visit: Payer: Self-pay | Admitting: Adult Health

## 2018-02-20 DIAGNOSIS — G44209 Tension-type headache, unspecified, not intractable: Secondary | ICD-10-CM

## 2018-03-23 DIAGNOSIS — F3132 Bipolar disorder, current episode depressed, moderate: Secondary | ICD-10-CM | POA: Diagnosis not present

## 2018-03-23 DIAGNOSIS — F900 Attention-deficit hyperactivity disorder, predominantly inattentive type: Secondary | ICD-10-CM | POA: Diagnosis not present

## 2018-03-23 DIAGNOSIS — F411 Generalized anxiety disorder: Secondary | ICD-10-CM | POA: Diagnosis not present

## 2018-04-17 ENCOUNTER — Other Ambulatory Visit: Payer: Self-pay | Admitting: Adult Health

## 2018-04-17 DIAGNOSIS — G44209 Tension-type headache, unspecified, not intractable: Secondary | ICD-10-CM

## 2018-05-03 NOTE — Progress Notes (Signed)
   N  O S  H  O  W                                              This very nice 47 y.o. [name] presents for a Screening/Preventative Visit & comprehensive evaluation and management of multiple medical co-morbidities.  Patient has been followed expectantly for labile  for HTN, HLD, Prediabetes  and Vitamin D Deficiency. She also has hx/o ADD, Bipolar disorder & Depression. Other problems include GERD controlled on current meds.      Patient is followed expectantly for Labile HTN predates. Patient's BP has been controlled at home and patient denies any cardiac symptoms as chest pain, palpitations, shortness of breath, dizziness or ankle swelling. Today's        Patient's lipids are controlled with diet. Last lipids were at goal: Lab Results  Component Value Date   CHOL 169 04/13/2017   HDL 86 04/13/2017   LDLCALC 69 04/13/2017   TRIG 68 04/13/2017   CHOLHDL 2.0 04/13/2017      Patient is followed expectantly for prediabetes  and patient denies reactive hypoglycemic symptoms, visual blurring, diabetic polys, or paresthesias. Last A1c was normal & at goal: Lab Results  Component Value Date   HGBA1C 4.7 04/13/2017      Finally, patient has history of  Vitamin D Deficiency  ("18"/2-16) and last Vitamin D was still very low: Lab Results  Component Value Date   VD25OH 27 (L) 04/13/2017

## 2018-05-04 ENCOUNTER — Ambulatory Visit: Payer: Self-pay | Admitting: Internal Medicine

## 2018-05-04 DIAGNOSIS — F3132 Bipolar disorder, current episode depressed, moderate: Secondary | ICD-10-CM | POA: Diagnosis not present

## 2018-05-11 DIAGNOSIS — F411 Generalized anxiety disorder: Secondary | ICD-10-CM | POA: Diagnosis not present

## 2018-05-11 DIAGNOSIS — F900 Attention-deficit hyperactivity disorder, predominantly inattentive type: Secondary | ICD-10-CM | POA: Diagnosis not present

## 2018-05-11 DIAGNOSIS — F3132 Bipolar disorder, current episode depressed, moderate: Secondary | ICD-10-CM | POA: Diagnosis not present

## 2018-05-21 ENCOUNTER — Ambulatory Visit (HOSPITAL_COMMUNITY)
Admission: RE | Admit: 2018-05-21 | Discharge: 2018-05-21 | Disposition: A | Discharge status: Home / Self Care | Payer: BLUE CROSS/BLUE SHIELD | Source: Home / Self Care | Attending: Psychiatry | Admitting: Psychiatry

## 2018-05-21 ENCOUNTER — Encounter (HOSPITAL_COMMUNITY): Payer: Self-pay

## 2018-05-21 ENCOUNTER — Emergency Department (HOSPITAL_COMMUNITY)
Admission: EM | Admit: 2018-05-21 | Discharge: 2018-05-23 | Discharge status: Against Medical Advice | Payer: BLUE CROSS/BLUE SHIELD | Attending: Emergency Medicine | Admitting: Emergency Medicine

## 2018-05-21 ENCOUNTER — Other Ambulatory Visit: Payer: Self-pay

## 2018-05-21 DIAGNOSIS — R4587 Impulsiveness: Secondary | ICD-10-CM | POA: Diagnosis not present

## 2018-05-21 DIAGNOSIS — Z886 Allergy status to analgesic agent status: Secondary | ICD-10-CM

## 2018-05-21 DIAGNOSIS — Z79899 Other long term (current) drug therapy: Secondary | ICD-10-CM | POA: Diagnosis not present

## 2018-05-21 DIAGNOSIS — F22 Delusional disorders: Secondary | ICD-10-CM

## 2018-05-21 DIAGNOSIS — F419 Anxiety disorder, unspecified: Secondary | ICD-10-CM | POA: Diagnosis not present

## 2018-05-21 DIAGNOSIS — F315 Bipolar disorder, current episode depressed, severe, with psychotic features: Secondary | ICD-10-CM

## 2018-05-21 DIAGNOSIS — F17210 Nicotine dependence, cigarettes, uncomplicated: Secondary | ICD-10-CM | POA: Diagnosis not present

## 2018-05-21 DIAGNOSIS — Z5320 Procedure and treatment not carried out because of patient's decision for unspecified reasons: Secondary | ICD-10-CM | POA: Insufficient documentation

## 2018-05-21 DIAGNOSIS — R450 Nervousness: Secondary | ICD-10-CM | POA: Diagnosis not present

## 2018-05-21 DIAGNOSIS — F12150 Cannabis abuse with psychotic disorder with delusions: Secondary | ICD-10-CM

## 2018-05-21 LAB — COMPREHENSIVE METABOLIC PANEL
ALK PHOS: 75 U/L (ref 38–126)
ALT: 14 U/L (ref 14–54)
AST: 17 U/L (ref 15–41)
Albumin: 4.6 g/dL (ref 3.5–5.0)
Anion gap: 9 (ref 5–15)
BUN: 17 mg/dL (ref 6–20)
CALCIUM: 9.6 mg/dL (ref 8.9–10.3)
CO2: 29 mmol/L (ref 22–32)
CREATININE: 0.97 mg/dL (ref 0.44–1.00)
Chloride: 104 mmol/L (ref 101–111)
GFR calc non Af Amer: >60 mL/min (ref >60)
Glucose, Bld: 94 mg/dL (ref 65–99)
Potassium: 4.1 mmol/L (ref 3.5–5.1)
SODIUM: 142 mmol/L (ref 135–145)
Total Bilirubin: 0.3 mg/dL (ref 0.3–1.2)
Total Protein: 7.3 g/dL (ref 6.5–8.1)

## 2018-05-21 LAB — CBC
HEMATOCRIT: 39.8 % (ref 36.0–46.0)
Hemoglobin: 13.5 g/dL (ref 12.0–15.0)
MCH: 33.5 pg (ref 26.0–34.0)
MCHC: 33.9 g/dL (ref 30.0–36.0)
MCV: 98.8 fL (ref 78.0–100.0)
PLATELETS: 250 10*3/uL (ref 150–400)
RBC: 4.03 MIL/uL (ref 3.87–5.11)
RDW: 12.7 % (ref 11.5–15.5)
WBC: 5.9 10*3/uL (ref 4.0–10.5)

## 2018-05-21 LAB — RAPID URINE DRUG SCREEN, HOSP PERFORMED
AMPHETAMINES: NOT DETECTED
Barbiturates: NOT DETECTED
Benzodiazepines: POSITIVE — AB
Cocaine: NOT DETECTED
Opiates: NOT DETECTED
TETRAHYDROCANNABINOL: POSITIVE — AB

## 2018-05-21 LAB — I-STAT BETA HCG BLOOD, ED (MC, WL, AP ONLY): I-stat hCG, quantitative: 6.5 m[IU]/mL — ABNORMAL HIGH (ref <5)

## 2018-05-21 LAB — ETHANOL: Alcohol, Ethyl (B): <10 mg/dL (ref <10)

## 2018-05-21 LAB — POC URINE PREG, ED: Preg Test, Ur: NEGATIVE

## 2018-05-21 MED ORDER — ONDANSETRON HCL 4 MG PO TABS: 4.0000 mg | ORAL_TABLET | Freq: Three times a day (TID) | ORAL | Status: DC | PRN

## 2018-05-21 MED ORDER — ESCITALOPRAM OXALATE 10 MG PO TABS
20.0000 mg | ORAL_TABLET | Freq: Every day | ORAL | Status: DC
  Filled 2018-05-21: qty 2

## 2018-05-21 MED ORDER — AMPHETAMINE-DEXTROAMPHETAMINE 20 MG PO TABS
20.0000 mg | ORAL_TABLET | Freq: Three times a day (TID) | ORAL | Status: DC
  Filled 2018-05-21 (×2): qty 1

## 2018-05-21 MED ORDER — TRAZODONE HCL 50 MG PO TABS
150.0000 mg | ORAL_TABLET | Freq: Every day | ORAL | Status: DC
  Administered 2018-05-21 – 2018-05-22 (×2): 150 mg via ORAL
  Filled 2018-05-21 (×2): qty 1

## 2018-05-21 MED ORDER — ALPRAZOLAM 1 MG PO TABS
1.0000 mg | ORAL_TABLET | Freq: Three times a day (TID) | ORAL | Status: DC | PRN
  Administered 2018-05-21: 1 mg via ORAL
  Filled 2018-05-21: qty 1

## 2018-05-21 MED ORDER — LAMOTRIGINE 100 MG PO TABS
300.0000 mg | ORAL_TABLET | Freq: Every day | ORAL | Status: DC
  Administered 2018-05-21 – 2018-05-22 (×2): 300 mg via ORAL
  Filled 2018-05-21 (×2): qty 3

## 2018-05-21 MED ORDER — ACETAMINOPHEN 325 MG PO TABS
650.0000 mg | ORAL_TABLET | ORAL | Status: DC | PRN
  Administered 2018-05-22: 650 mg via ORAL
  Filled 2018-05-21 (×2): qty 2

## 2018-05-21 MED ORDER — ALUM & MAG HYDROXIDE-SIMETH 200-200-20 MG/5ML PO SUSP: 30.0000 mL | Freq: Four times a day (QID) | ORAL | Status: DC | PRN

## 2018-05-21 MED ORDER — ZOLPIDEM TARTRATE 5 MG PO TABS: 5.0000 mg | ORAL_TABLET | Freq: Every evening | ORAL | Status: DC | PRN

## 2018-05-21 MED ORDER — QUETIAPINE FUMARATE 300 MG PO TABS
300.0000 mg | ORAL_TABLET | Freq: Every day | ORAL | Status: DC
  Administered 2018-05-21 – 2018-05-22 (×2): 300 mg via ORAL
  Filled 2018-05-21 (×2): qty 1

## 2018-05-21 NOTE — H&P (Signed)
Behavioral Health Medical Screening Exam  Angelica Weeks is an 47 y.o. female.  Total Time spent with patient: 30 minutes  Psychiatric Specialty Exam: Physical Exam  Nursing note and vitals reviewed. Constitutional: She is oriented to person, place, and time. She appears well-developed and well-nourished.  Cardiovascular: Normal rate.  Respiratory: Effort normal.  Musculoskeletal: Normal range of motion.  Neurological: She is alert and oriented to person, place, and time.  Skin: Skin is warm.    Review of Systems  Constitutional: Negative.   HENT: Negative.   Eyes: Negative.   Respiratory: Negative.   Cardiovascular: Negative.   Gastrointestinal: Negative.   Genitourinary: Negative.   Musculoskeletal: Negative.   Skin: Negative.   Neurological: Negative.   Endo/Heme/Allergies: Negative.   Psychiatric/Behavioral:       Paranoid ideations    Blood pressure 117/81, pulse 86, temperature 98.3 F (36.8 C), resp. rate 16, SpO2 100 %.There is no height or weight on file to calculate BMI.  General Appearance: Disheveled  Eye Contact:  Fair  Speech:  Pressured  Volume:  Increased  Mood:  Dysphoric  Affect:  Labile  Thought Process:  Disorganized and Descriptions of Associations: Circumstantial  Orientation:  Full (Time, Place, and Person)  Thought Content:  Paranoid Ideation  Suicidal Thoughts:  No  Homicidal Thoughts:  No  Memory:  Immediate;   Fair Recent;   Fair Remote;   Fair  Judgement:  Fair  Insight:  Fair  Psychomotor Activity:  Increased  Concentration: Concentration: Fair and Attention Span: Fair  Recall:  F[employer]Fair  Fund of Knowledge:Fair  Language: Good  Akathisia:  No  Handed:  Right  AIMS (if indicated):     Assets:  Desire for Improvement Social Support  Sleep:       Musculoskeletal: Strength & Muscle Tone: within normal limits Gait & Station: normal Patient leans: N/A  Blood pressure 117/81, pulse 86, temperature 98.3 F (36.8 C), resp. rate 16,  SpO2 100 %.  Recommendations: Patient meets criteria for inpatient treatment.  However there is no bed available on the 500 hall for this patient.  [facility]  Patient does take medications and per patient and her boyfriend she is compliant.  Patient takes trazodone 100 mg 1-2 tabs p.o. nightly, Seroquel 300 mg p.o. nightly, Lamictal 200 mg p.o. nightly, Xanax 1 mg 1 tab as needed.  Based on my evaluation the patient does not appear to have an emergency medical condition.  Gerlene Burdockravis B Olney Monier, FNP 05/21/2018, 11:49 AM

## 2018-05-21 NOTE — ED Notes (Signed)
Bed: WL[facility]4 Expected date:  Expected time:  Means of arrival:  Comments: 
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
## 2018-05-21 NOTE — B[facility] Counselor (Signed)
Clinician spoke to the pt and noted that she wanted to leave. Clinician discussed the process of treatment when a [facility] Clinician explained to the pt since [facility], the recommendation was inpatient treatment and there is no appropriate beds available, [facility]. Pt reported, she wanted to go home and [facility]. Clinician expressed to the pt that there is no guarantee that there would be appropriate beds for the pt [facility] tomorrow and the process would be the same. Clinician expressed to the pt that she can not discharge her, it is up to the EDP. Pt reported, she was not seen by a doctor; per chart the pt was seen by [place]. Pt reported, having a headache for hours. Clinician asked if she told staff she was in pain and if any one checked in on her. Pt reported, she was checked on but did not tell anyone of her pain. Pt reported, she is not a danger to herself or others and wants to leave. Clinician discussed possible IVC if she tries to leave. Pt reported, is is awake of IVC paperwork. Clinician expressed if she is IVC'd she will have to stay at the hosptial. Pt then reported, if I she has to stay if she can have a Xanax (which she is prescribed) to relax.  Clinician spoke to Dr. Jeraldine LootsLockwood on the pt's. Dr. Jeraldine LootsLockwood reported, he will speak to the pt. Clinician updated Barkley BrunsKristine, RN.   Redmond Pullingreylese D Chantel Teti, MS, [facility], [facility] Triage Specialist [phone]
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
## 2018-05-21 NOTE — [facility] Counselor (Signed)
Case staffed with Reola Calkinsravis Money, NP, who also seen patient face to face. Patient recommended for inpatient treatment.   [facility] Charge Nurse Darl PikesSusan, RN, informing her [facility] [employer].

## 2018-05-21 NOTE — ED Notes (Signed)
Report given to Katrina RN.
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
## 2018-05-21 NOTE — ED Notes (Addendum)
Per chart review, [facility] and Inpatient treatment was recommended.  However, [facility] does not have a bed available, so Pt was sent here.

## 2018-05-22 DIAGNOSIS — F22 Delusional disorders: Secondary | ICD-10-CM | POA: Diagnosis not present

## 2018-05-22 DIAGNOSIS — F12150 Cannabis abuse with psychotic disorder with delusions: Secondary | ICD-10-CM | POA: Diagnosis not present

## 2018-05-22 DIAGNOSIS — R4587 Impulsiveness: Secondary | ICD-10-CM

## 2018-05-22 DIAGNOSIS — F1390 Sedative, hypnotic, or anxiolytic use, unspecified, uncomplicated: Secondary | ICD-10-CM

## 2018-05-22 DIAGNOSIS — F419 Anxiety disorder, unspecified: Secondary | ICD-10-CM

## 2018-05-22 DIAGNOSIS — F17210 Nicotine dependence, cigarettes, uncomplicated: Secondary | ICD-10-CM

## 2018-05-22 DIAGNOSIS — R450 Nervousness: Secondary | ICD-10-CM | POA: Diagnosis not present

## 2018-05-22 DIAGNOSIS — F1099 Alcohol use, unspecified with unspecified alcohol-induced disorder: Secondary | ICD-10-CM

## 2018-05-22 HISTORY — DX: Delusional disorders: F22

## 2018-05-22 HISTORY — DX: Cannabis abuse with psychotic disorder with delusions: F12.150

## 2018-05-22 MED ORDER — CLONAZEPAM 0.5 MG PO TABS
0.5000 mg | ORAL_TABLET | Freq: Two times a day (BID) | ORAL | Status: DC | PRN
  Administered 2018-05-22 – 2018-05-23 (×2): 0.5 mg via ORAL
  Filled 2018-05-22 (×2): qty 1

## 2018-05-22 MED ORDER — IBUPROFEN 800 MG PO TABS
800.0000 mg | ORAL_TABLET | Freq: Once | ORAL | Status: AC
  Administered 2018-05-22: 800 mg via ORAL
  Filled 2018-05-22: qty 1

## 2018-05-22 MED ORDER — IBUPROFEN 200 MG PO TABS: 600.0000 mg | ORAL_TABLET | Freq: Once | ORAL | Status: DC

## 2018-05-22 MED ORDER — CYCLOBENZAPRINE HCL 10 MG PO TABS
5.0000 mg | ORAL_TABLET | Freq: Once | ORAL | Status: AC
  Administered 2018-05-22: 5 mg via ORAL
  Filled 2018-05-22: qty 1
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
## 2018-05-22 NOTE — ED Notes (Signed)
PT EXPRESSED THAT SHE IS NOT GETTING THE HELP THAT SHE NEEDS WITH US, AND SHE DOESN'T UNDERSTAND WHY IT IS TAKING HER SO LONG TO GET OVER TO [facility]. PT STS, "I'M WASTING ALL OF THIS MONEY SITTING HERE ON THIS BED. I COULD BE SPENDING TIME WITH MY DAUGHTER. I JUST WANT TO LEAVE SINCE YOU GUYS ARE NOT HELPING ME." BH STAFF DAVE AND THE NP MADE AWARE OF PT'S CONCERNS. THEY AGREE TO SPEAK WITH HER.

## 2018-05-22 NOTE — Progress Notes (Addendum)
Patient ID: Angelica Weeks, female   DOB: 02-Mar-1971, 47 y.o.   MRN: [id number]  Pt was seen and chart reviewed with treatment team and Dr Jannifer FranklinAkintayo today.Pt is voicing disagreement with waiting for an inpatient bed at a psychiatric facility. Pt states she wants to leave and go home. Pt is not determined to be a danger to herself or others and is not under involuntary commitment. Pt does not meet criteria for involuntary commitment and denies suicidal, homicidal ideation and also denies auditory and visual hallucinations. Pt has been educated regarding the process of finding a bed and that it sometimes takes time to find placement. Pt may still decide to leave the hospital AMA, and in this case she can not be held against her will.   Angelica AbbeLaurie Britton Parks, NP-C 05-22-2018       [phone] Patient seen face-to-face for psychiatric evaluation, chart reviewed and case discussed with the physician extender and developed treatment plan. Reviewed the information documented and agree with the treatment plan. Thedore MinsMojeed Pailynn Vahey, MD

## 2018-05-22 NOTE — ED Notes (Signed)
Patient refuses meds and says she does not take Lexapro and that she wants a room soon or she's going to leave since this is not helped being here. I told her she should have a room today.

## 2018-05-23 NOTE — ED Notes (Signed)
Patient very tearful and upset stating she wants to go home staying her is making me worse. Advised patient to wait and speak to psychiatry this morning. Patient  stated no and said we can not keep her here.  Notified Dr. Patria Maneampos that patient was leaving against medical advice.  Belongings returned and boyfriend  Called to pick up patient.

## 2018-05-31 DIAGNOSIS — F411 Generalized anxiety disorder: Secondary | ICD-10-CM | POA: Diagnosis not present

## 2018-05-31 DIAGNOSIS — F3132 Bipolar disorder, current episode depressed, moderate: Secondary | ICD-10-CM | POA: Diagnosis not present

## 2018-05-31 DIAGNOSIS — F900 Attention-deficit hyperactivity disorder, predominantly inattentive type: Secondary | ICD-10-CM | POA: Diagnosis not present

## 2018-06-04 DIAGNOSIS — F3132 Bipolar disorder, current episode depressed, moderate: Secondary | ICD-10-CM | POA: Diagnosis not present

## 2018-06-04 DIAGNOSIS — F411 Generalized anxiety disorder: Secondary | ICD-10-CM | POA: Diagnosis not present

## 2018-06-04 DIAGNOSIS — F900 Attention-deficit hyperactivity disorder, predominantly inattentive type: Secondary | ICD-10-CM | POA: Diagnosis not present

## 2018-06-15 ENCOUNTER — Ambulatory Visit: Payer: Self-pay | Admitting: Adult Health

## 2018-06-15 DIAGNOSIS — F17210 Nicotine dependence, cigarettes, uncomplicated: Secondary | ICD-10-CM | POA: Diagnosis not present

## 2018-06-15 DIAGNOSIS — F3181 Bipolar II disorder: Secondary | ICD-10-CM | POA: Diagnosis not present

## 2018-06-15 DIAGNOSIS — F319 Bipolar disorder, unspecified: Secondary | ICD-10-CM | POA: Diagnosis not present

## 2018-06-16 DIAGNOSIS — F319 Bipolar disorder, unspecified: Secondary | ICD-10-CM | POA: Diagnosis not present

## 2018-06-17 DIAGNOSIS — F319 Bipolar disorder, unspecified: Secondary | ICD-10-CM | POA: Diagnosis not present

## 2018-06-19 DIAGNOSIS — F319 Bipolar disorder, unspecified: Secondary | ICD-10-CM | POA: Diagnosis not present

## 2018-06-22 NOTE — Progress Notes (Signed)
FOLLOW UP  Assessment and Plan:   Angelica Weeks was seen today for follow-up.  Diagnoses and all orders for this visit:  Bipolar 1 disorder, depressed (HCC)/ Paranoid delusion (HCC) Doing better since discharge, and with reduction of internet and social media use She does have follow up with Dr. Yetta Weeks scheduled, encouraged her to reschedule therapy as recommended Defer further medication adjustments to Dr. Yetta Weeks, will reschedule CPE for this fall  Defer labs today; labs from ER reviewed and discussed Stress management techniques discussed, increase water, good sleep hygiene discussed, increase exercise, and increase veggies.   Continue diet and meds as discussed. Further disposition pending results of labs. Discussed med's effects and SE's.   Over 15 minutes of exam, counseling, chart review, and critical decision making was performed.   Future Appointments  Date Time Provider Department Center  10/06/2018 10:00 AM Angelica Weeks [facility] None    ----------------------------------------------------------------------------------------------------------------------  HPI 47 y.o. female  presents for follow up on ADD and bipolar depression. She was also seen recently in ED for reports of 2 weeks of paranoid delusions and had medical workup which was reassurring, but appears she left prior to psych evaluation. She reports she ended up being referred to see a therapist but has not gone, did go to [name] and was there for 5 days and did group therapy. She has appointment to follow up with her psych provider Dr. Deatra RobinsonKaren Weeks who prescribes her medications - has an appointment 7/31.   She reports paranoid thoughts have improved since discharge, and with avoidance of Internet and social media; has switched to a prepaid basic phone. She reports she also bought a puppy and is spending more time away from electronic devices and feels is doing better.   She wants to discuss switching from  xanax to ativan today; discussed she should follow up with psych provider for this.   BMI is Body mass index is 23.08 kg/m., she has not been working on diet and exercise. Wt Readings from Last 3 Encounters:  06/23/18 143 lb (64.9 kg)  05/21/18 144 lb 12.8 oz (65.7 kg)  11/12/17 154 lb (69.9 kg)       Current Medications:  Current Outpatient Medications on File Prior to Visit  Medication Sig  . ALPRAZolam (XANAX) 1 MG tablet Take 1 mg by mouth 3 (three) times daily as needed for anxiety.   Marland Kitchen. amphetamine-dextroamphetamine (ADDERALL) 20 MG tablet Take 20 mg by mouth 3 (three) times daily as needed.   . cyclobenzaprine (FLEXERIL) 5 MG tablet TAKE 1 TABLET(5 MG) BY MOUTH THREE TIMES DAILY AS NEEDED FOR MUSCLE SPASMS  . esomeprazole (NEXIUM) 40 MG capsule TAKE ONE CAPSULE BY MOUTH EVERY DAY (Patient taking differently: TAKE ONE CAPSULE BY MOUTH EVERY DAY AS NEEDED)  . hydrOXYzine (ATARAX/VISTARIL) 10 MG tablet Take 10 mg by mouth at bedtime.  . lamoTRIgine (LAMICTAL) 200 MG tablet Take 400 mg by mouth at bedtime.   . traZODone (DESYREL) 150 MG tablet Take 150 mg by mouth at bedtime.  Marland Kitchen. QUEtiapine Fumarate (SEROQUEL XR) 150 MG 24 hr tablet Take 300 mg by mouth daily.   No current facility-administered medications on file prior to visit.      Allergies:  Allergies  Allergen Reactions  . Aspirin Other (See Comments)    Burns stomach     Medical History:  Past Medical History:  Diagnosis Date  . ADD (attention deficit disorder)   . Bipolar 1 disorder, depressed (HCC)   . Cellulitis of left  eyelid 01/2014  . Depression    Family history- Reviewed and unchanged Social history- Reviewed and unchanged   Review of Systems:  Review of Systems  Constitutional: Negative for malaise/fatigue and weight loss.  HENT: Negative for hearing loss and tinnitus.   Eyes: Negative for blurred vision and double vision.  Respiratory: Negative for cough, shortness of breath and wheezing.    Cardiovascular: Negative for chest pain, palpitations, orthopnea, claudication and leg swelling.  Gastrointestinal: Negative for abdominal pain, blood in stool, constipation, diarrhea, heartburn, melena, nausea and vomiting.  Genitourinary: Negative.   Musculoskeletal: Negative for joint pain and myalgias.  Skin: Negative for rash.  Neurological: Negative for dizziness, tingling, sensory change, weakness and headaches.  Endo/Heme/Allergies: Negative for polydipsia.  Psychiatric/Behavioral: Negative for depression, hallucinations, substance abuse and suicidal ideas. The patient is nervous/anxious. The patient does not have insomnia.        Still having some paranoid thoughts  All other systems reviewed and are negative.   Physical Exam: BP 100/68   Pulse 84   Temp (!) 97.5 F (36.4 C)   Ht 5\' 6"  (1.676 m)   Wt 143 lb (64.9 kg)   LMP  (LMP Unknown)   SpO2 98%   BMI 23.08 kg/m  Wt Readings from Last 3 Encounters:  06/23/18 143 lb (64.9 kg)  05/21/18 144 lb 12.8 oz (65.7 kg)  11/12/17 154 lb (69.9 kg)   General Appearance: Well nourished, in no apparent distress. Eyes: PERRLA, EOMs, conjunctiva no swelling or erythema Sinuses: No Frontal/maxillary tenderness ENT/Mouth: Ext aud canals clear, TMs without erythema, bulging. No erythema, swelling, or exudate on post pharynx.  Tonsils not swollen or erythematous. Hearing normal.  Neck: Supple, thyroid normal.  Respiratory: Respiratory effort normal, BS equal bilaterally without rales, rhonchi, wheezing or stridor.  Cardio: RRR with no MRGs. Brisk peripheral pulses without edema.  Abdomen: Soft, + BS.  Non tender, no guarding, rebound, hernias, masses. Lymphatics: Non tender without lymphadenopathy.  Musculoskeletal: Symmetrical strength, Normal gait Skin: Warm, dry without rashes, lesions, ecchymosis.  Neuro: Cranial nerves intact. No cerebellar symptoms.  Psych: Awake and oriented X 3, normal affect, Insight and Judgment fair, still  demonstrates some paranoid thought process.    Dan Maker, Weeks 10:29 AM [name]

## 2018-06-23 ENCOUNTER — Ambulatory Visit: Payer: BLUE CROSS/BLUE SHIELD | Admitting: Adult Health

## 2018-06-23 ENCOUNTER — Encounter: Payer: Self-pay | Admitting: Adult Health

## 2018-06-23 VITALS — BP 100/68 | HR 84 | Temp 97.5°F | Ht 66.0 in | Wt 143.0 lb

## 2018-06-23 DIAGNOSIS — F988 Other specified behavioral and emotional disorders with onset usually occurring in childhood and adolescence: Secondary | ICD-10-CM

## 2018-06-23 DIAGNOSIS — F22 Delusional disorders: Secondary | ICD-10-CM | POA: Diagnosis not present

## 2018-06-23 DIAGNOSIS — F319 Bipolar disorder, unspecified: Secondary | ICD-10-CM | POA: Diagnosis not present

## 2018-06-29 DIAGNOSIS — F333 Major depressive disorder, recurrent, severe with psychotic symptoms: Secondary | ICD-10-CM | POA: Diagnosis not present

## 2018-07-07 DIAGNOSIS — F333 Major depressive disorder, recurrent, severe with psychotic symptoms: Secondary | ICD-10-CM | POA: Diagnosis not present

## 2018-07-08 ENCOUNTER — Ambulatory Visit: Payer: Self-pay | Admitting: Adult Health

## 2018-07-13 DIAGNOSIS — F331 Major depressive disorder, recurrent, moderate: Secondary | ICD-10-CM | POA: Diagnosis not present

## 2018-07-13 DIAGNOSIS — F411 Generalized anxiety disorder: Secondary | ICD-10-CM | POA: Diagnosis not present

## 2018-07-14 DIAGNOSIS — F333 Major depressive disorder, recurrent, severe with psychotic symptoms: Secondary | ICD-10-CM | POA: Diagnosis not present

## 2018-07-15 ENCOUNTER — Ambulatory Visit: Payer: BLUE CROSS/BLUE SHIELD | Admitting: Internal Medicine

## 2018-07-15 VITALS — BP 116/80 | HR 108 | Temp 97.7°F | Resp 16 | Ht 66.0 in | Wt 144.4 lb

## 2018-07-15 DIAGNOSIS — S0083XA Contusion of other part of head, initial encounter: Secondary | ICD-10-CM

## 2018-07-16 ENCOUNTER — Encounter: Payer: Self-pay | Admitting: Internal Medicine

## 2018-07-16 NOTE — Progress Notes (Signed)
  Subjective:    Patient ID: Angelica Weeks, female    DOB: 1971-02-22, 47 y.o.   MRN: [id number]  HPI   This very nice 47 yo [name] presents for concern of persistent swelling of he R cheek falling a fall 3 weeks ago. She slipped and struck the cheek with initial bruising which has resolved. Denies head injury or LOC or sx's of postural orthostasis.   Medication Sig  . ALPRAZolam  1 MG tablet Take 1 mg by mouth 3 (three) times daily as needed for anxiety.   . ADDERALL 20 MG tablet Take times daily as needed.   . cyclobenzaprine  5 MG tablet TAKE 1 TABLETTHREE TIMES DAILY AS NEEDED FOR MUSCLE SPASMS  . esomeprazole 40 MG capsule TAKE ONE CAPSULE BY MOUTH EVERY DAY (  . lamoTRIgine  200 MG tablet Take 400 mg by mouth at bedtime.   Marland Kitchen. OLANZapine  20 MG tablet Take 20 mg by mouth at bedtime.  . ondansetron  4 MG tablet Take  every 8 hours as needed for nausea or vomiting.  . traZODone  100 MG tablet  Takes 200 mg at bedtime.  . hydrOXYzine10 MG tablet Take  at bedtime.  Marland Kitchen. QUEtiapine Fumarate (SEROQUEL XR) 150 MG 24 hr tablet Take 300 mg  daily.  . traZODone (DESYREL) 150 MG tablet Take 150 mg by mouth at bedtime.   No facility-administered medications prior to visit.    Allergies  Allergen Reactions  . Aspirin Other (See Comments)    Burns stomach   Past Medical History:  Diagnosis Date  . ADD (attention deficit disorder)   . Bipolar 1 disorder, depressed (HCC)   . Cellulitis of left eyelid 01/2014  . Depression    Review of Systems .Marland Kitchen.  10 point systems review negative except as above.    Objective:   Physical Exam   BP 116/80   P 108   T 97.7 F   R16   Ht 5\' 6"     Wt 144 lb     BMI 23.31   HEENT - WNL. Slight facial asymmetry with sl swelling of the R cheek. No palpable sub-cutaneous abnormalities CN testin is Nl & Symmetric. Neck - supple.  Chest - Clear equal BS. Cor - Nl HS. RRR w/o sig MGR. PP 1(+). No edema. MS- FROM w/o deformities.  Gait Nl. Neuro -  Nl w/o focal  abnormalities.    Assessment & Plan:   1. Contusion of face  - Reassured.

## 2018-07-17 ENCOUNTER — Other Ambulatory Visit: Payer: Self-pay | Admitting: Internal Medicine

## 2018-07-17 DIAGNOSIS — G44209 Tension-type headache, unspecified, not intractable: Secondary | ICD-10-CM

## 2018-07-22 DIAGNOSIS — F333 Major depressive disorder, recurrent, severe with psychotic symptoms: Secondary | ICD-10-CM | POA: Diagnosis not present

## 2018-07-24 ENCOUNTER — Encounter (HOSPITAL_COMMUNITY): Payer: Self-pay | Admitting: Behavioral Health

## 2018-07-24 ENCOUNTER — Ambulatory Visit (HOSPITAL_COMMUNITY)
Admission: RE | Admit: 2018-07-24 | Discharge: 2018-07-24 | Disposition: A | Discharge status: Home / Self Care | Payer: BLUE CROSS/BLUE SHIELD | Attending: Psychiatry | Admitting: Psychiatry

## 2018-07-24 DIAGNOSIS — F22 Delusional disorders: Secondary | ICD-10-CM | POA: Insufficient documentation

## 2018-07-24 NOTE — H&P (Signed)
Behavioral Health Medical Screening Exam  Angelica Weeks is an 47 y.o. female presents as walk in [facility]; brought in by her husband with complaints of auditory hallucinations and paranoia that she is being watched.  [facility] and currently has outpatient psychiatric provider that is working with her and medication management. Patient denies suicidal/self-harm/homicidal ideation.  States that there has been some improvement in the auditory hallucinations not occurring as often; but hasn't stopped. Also states that she is having some paranoia the she is being watched; but looking things up on the computer makes worse.  Husband at side states has an appointment with psychiatrist in 2 weeks and therapist next week; both in same office and will be able to speak with psychiatrist when go in to office. Encouraged patient to stay off of Internet and avoid triggers.     Total Time spent with patient: 1 hour  Psychiatric Specialty Exam: Physical Exam  Vitals reviewed. Constitutional: She is oriented to person, place, and time. She appears well-developed and well-nourished.  Neck: Normal range of motion. Neck supple.  Respiratory: Effort normal.  Musculoskeletal: Normal range of motion.  Neurological: She is alert and oriented to person, place, and time.  Skin: Skin is warm and dry.  Psychiatric: Her speech is normal. Her mood appears anxious (Stable). She is actively hallucinating (stable). Thought content is paranoid (stable). Thought content is not delusional. Cognition and memory are normal. She expresses no homicidal and no suicidal ideation.    Review of Systems  Psychiatric/Behavioral: Negative for memory loss and suicidal ideas. Hallucinations: States she hears someone calling her name. Substance abuse: Denies at this time; history of THC; last use couple weeks ago prior to first incident of psychosis. The patient does not have insomnia. Nervous/anxious: Stable related  to the paranoia and psychosis.   All other systems reviewed and are negative.   Blood pressure 132/75, pulse 95, temperature 98.1 F (36.7 C), resp. rate 16, SpO2 100 %.There is no height or weight on file to calculate BMI.  General Appearance: Casual  Eye Contact:  Good  Speech:  Clear and Coherent and Normal Rate  Volume:  Normal  Mood:  Anxious  Affect:  Appropriate  Thought Process:  Coherent  Orientation:  Full (Time, Place, and Person)  Thought Content:  Hallucinations: Auditory and Paranoid Ideation  Suicidal Thoughts:  No  Homicidal Thoughts:  No  Memory:  Immediate;   Good Recent;   Good Remote;   Good  Judgement:  Fair  Insight:  Fair and Present  Psychomotor Activity:  Normal  Concentration: Concentration: Fair and Attention Span: Fair  Recall:  Good  Fund of Knowledge:Fair  Language: Good  Akathisia:  No  Handed:  Right  AIMS (if indicated):     Assets:  Communication Skills Desire for Improvement Housing Physical Health Resilience Social Support Transportation  Sleep:       Musculoskeletal: Strength & Muscle Tone: within normal limits Gait & Station: normal Patient leans: N/A  Blood pressure 132/75, pulse 95, temperature 98.1 F (36.7 C), resp. rate 16, SpO2 100 %.  Recommendations:  Follow up with outpatient psychiatric provider (Dr. Yetta BarreJones) and therapist  Disposition: No evidence of imminent risk to self or others at present.   Patient does not meet criteria for psychiatric inpatient admission. Supportive therapy provided about ongoing stressors. Refer to IOP. Discussed crisis plan, support from social network, calling 911, coming to the Emergency Department, and calling Suicide Hotline.  Based on  my evaluation the patient does not appear to have an emergency medical condition.  Audrey Thull, NP 07/24/2018, 12:10 PM

## 2018-08-03 ENCOUNTER — Other Ambulatory Visit: Payer: Self-pay | Admitting: Internal Medicine

## 2018-08-03 DIAGNOSIS — G44209 Tension-type headache, unspecified, not intractable: Secondary | ICD-10-CM

## 2018-08-05 DIAGNOSIS — F411 Generalized anxiety disorder: Secondary | ICD-10-CM | POA: Diagnosis not present

## 2018-08-05 DIAGNOSIS — F909 Attention-deficit hyperactivity disorder, unspecified type: Secondary | ICD-10-CM | POA: Diagnosis not present

## 2018-08-05 DIAGNOSIS — F3132 Bipolar disorder, current episode depressed, moderate: Secondary | ICD-10-CM | POA: Diagnosis not present

## 2018-08-05 DIAGNOSIS — F900 Attention-deficit hyperactivity disorder, predominantly inattentive type: Secondary | ICD-10-CM | POA: Diagnosis not present

## 2018-08-24 DIAGNOSIS — F333 Major depressive disorder, recurrent, severe with psychotic symptoms: Secondary | ICD-10-CM | POA: Diagnosis not present

## 2018-08-26 DIAGNOSIS — F333 Major depressive disorder, recurrent, severe with psychotic symptoms: Secondary | ICD-10-CM | POA: Diagnosis not present

## 2018-08-30 DIAGNOSIS — F333 Major depressive disorder, recurrent, severe with psychotic symptoms: Secondary | ICD-10-CM | POA: Diagnosis not present

## 2018-09-03 DIAGNOSIS — F333 Major depressive disorder, recurrent, severe with psychotic symptoms: Secondary | ICD-10-CM | POA: Diagnosis not present

## 2018-09-08 DIAGNOSIS — F3132 Bipolar disorder, current episode depressed, moderate: Secondary | ICD-10-CM | POA: Diagnosis not present

## 2018-09-08 DIAGNOSIS — F411 Generalized anxiety disorder: Secondary | ICD-10-CM | POA: Diagnosis not present

## 2018-09-08 DIAGNOSIS — F900 Attention-deficit hyperactivity disorder, predominantly inattentive type: Secondary | ICD-10-CM | POA: Diagnosis not present

## 2018-09-09 DIAGNOSIS — F333 Major depressive disorder, recurrent, severe with psychotic symptoms: Secondary | ICD-10-CM | POA: Diagnosis not present

## 2018-09-14 DIAGNOSIS — F333 Major depressive disorder, recurrent, severe with psychotic symptoms: Secondary | ICD-10-CM | POA: Diagnosis not present

## 2018-09-16 DIAGNOSIS — F411 Generalized anxiety disorder: Secondary | ICD-10-CM | POA: Diagnosis not present

## 2018-09-16 DIAGNOSIS — F900 Attention-deficit hyperactivity disorder, predominantly inattentive type: Secondary | ICD-10-CM | POA: Diagnosis not present

## 2018-09-22 DIAGNOSIS — F333 Major depressive disorder, recurrent, severe with psychotic symptoms: Secondary | ICD-10-CM | POA: Diagnosis not present

## 2018-09-30 DIAGNOSIS — F333 Major depressive disorder, recurrent, severe with psychotic symptoms: Secondary | ICD-10-CM | POA: Diagnosis not present

## 2018-09-30 DIAGNOSIS — F900 Attention-deficit hyperactivity disorder, predominantly inattentive type: Secondary | ICD-10-CM | POA: Diagnosis not present

## 2018-09-30 DIAGNOSIS — F3132 Bipolar disorder, current episode depressed, moderate: Secondary | ICD-10-CM | POA: Diagnosis not present

## 2018-09-30 DIAGNOSIS — F411 Generalized anxiety disorder: Secondary | ICD-10-CM | POA: Diagnosis not present

## 2018-10-05 ENCOUNTER — Other Ambulatory Visit: Payer: Self-pay | Admitting: Internal Medicine

## 2018-10-05 DIAGNOSIS — G44209 Tension-type headache, unspecified, not intractable: Secondary | ICD-10-CM

## 2018-10-05 DIAGNOSIS — K219 Gastro-esophageal reflux disease without esophagitis: Secondary | ICD-10-CM | POA: Insufficient documentation

## 2018-10-05 NOTE — Progress Notes (Signed)
Complete Physical  Assessment and Plan:  Angelica Weeks was seen today for annual exam.  Diagnoses and all orders for this visit:  Encounter for routine adult health examination with abnormal findings  Bipolar 1 disorder, depressed (HCC) Managed by Dr. Deatra Robinson Continue medications  Lifestyle discussed: diet/exerise, sleep hygiene, stress management, hydration  Attention deficit disorder (ADD) without hyperactivity Follow up with Dr. Yetta Barre  Gastroesophageal reflux disease, esophagitis presence not specified Well managed on current medications, uses PRN Discussed diet, avoiding triggers and other lifestyle changes  Medication management -     CBC with Differential/Platelet -     COMPLETE METABOLIC PANEL WITH GFR -     Magnesium -     Urinalysis, Routine w reflex microscopic  Screening for thyroid disorder -     TSH  Screening cholesterol level -     Lipid panel  Screening for diabetes mellitus -     Hemoglobin A1c  Vitamin D deficiency -     VITAMIN D 25 Hydroxy (Vit-D Deficiency, Fractures)  Anemia, unspecified type -     CBC with Differential/Platelet -     Vitamin B12 -     Iron,Total/Total Iron Binding Cap  Encounter for screening for cervical cancer  -     Last PAP remote, 13+ years ago -     She is willing to get this done if by female provider -     Ambulatory referral to Gynecology  Smoker unmotivated to quit Discussed risks associated with tobacco use and advised to reduce or quit Patient is not ready to do so, but advised to consider strongly Will follow up at the next visit - hasn't have pneumonia vaccine; will administer 23 valent today   Discussed med's effects and SE's. Screening labs and tests as requested with regular follow-up as recommended. Over 40 minutes of exam, counseling, chart review, and complex, high level critical decision making was performed this visit.   Future Appointments  Date Time Provider Department Center  10/10/2019 10:00  AM Judd Gaudier, NP [facility] None     HPI  47 y.o. female  presents for a complete physical and follow up for has ADD (attention deficit disorder); Bipolar 1 disorder, depressed (HCC); GERD (gastroesophageal reflux disease); and Smoker unmotivated to quit on their problem list.   She was also seen this year in ED for reports of 2 weeks of paranoid delusions and had medical workup which was reassurring, but appears she left prior to psych evaluation. She  did go to [facility] and was there for 5 days and did group therapy. She is now followed by Dr. Deatra Robinson, recently started on Vraylar 6 mg and concerned with weight gain.   She reports paranoid thoughts have improved since discharge, and with avoidance of Internet and social media; has switched to a prepaid basic phone. She reports she also bought a puppy and is spending more time away from electronic devices and feels is doing better.   she currently continues to smoke 1 pack a day; 29 pack year smoking history; discussed risks associated with smoking, patient is not ready to quit.   BMI is Body mass index is 26.53 kg/m., she has not been working on diet and exercise, but walks daily with her dog.  Wt Readings from Last 3 Encounters:  10/06/18 158 lb 3.2 oz (71.8 kg)  07/15/18 144 lb 6.4 oz (65.5 kg)  06/23/18 143 lb (64.9 kg)   Today their BP is BP: 108/70 She does not  workout. She denies chest pain, shortness of breath, dizziness.   She is not on cholesterol medication and denies myalgias. Her cholesterol is at goal. The cholesterol last visit was:   Lab Results  Component Value Date   CHOL 169 04/13/2017   HDL 86 04/13/2017   LDLCALC 69 04/13/2017   TRIG 68 04/13/2017   CHOLHDL 2.0 04/13/2017   Last Z6X in the office was:  Lab Results  Component Value Date   HGBA1C 4.7 04/13/2017   Last GFR: Lab Results  Component Value Date   GFRNONAA >60 05/21/2018   Patient is not currently on Vitamin D supplement.   Lab  Results  Component Value Date   VD25OH 27 (L) 04/13/2017      Current Medications:  Current Outpatient Medications on File Prior to Visit  Medication Sig Dispense Refill  . ALPRAZolam (XANAX) 1 MG tablet Take 1 mg by mouth 3 (three) times daily as needed for anxiety.     Marland Kitchen amphetamine-dextroamphetamine (ADDERALL) 20 MG tablet Take 20 mg by mouth 3 (three) times daily as needed.     . Cariprazine HCl (VRAYLAR) 6 MG CAPS Take by mouth as needed.    . cyclobenzaprine (FLEXERIL) 5 MG tablet TAKE 1 TABLET(5 MG) BY MOUTH THREE TIMES DAILY AS NEEDED FOR MUSCLE SPASMS 60 tablet 0  . esomeprazole (NEXIUM) 40 MG capsule TAKE ONE CAPSULE BY MOUTH EVERY DAY 90 capsule 0  . lamoTRIgine (LAMICTAL) 200 MG tablet Take 400 mg by mouth at bedtime.     . ondansetron (ZOFRAN) 4 MG tablet TAKE 1 TABLET(4 MG) BY MOUTH DAILY AS NEEDED FOR NAUSEA OR VOMITING 30 tablet 0  . traZODone (DESYREL) 100 MG tablet Take 100 mg by mouth. Takes 200 mg at bedtime.    Marland Kitchen OLANZapine (ZYPREXA) 20 MG tablet Take 20 mg by mouth at bedtime.     No current facility-administered medications on file prior to visit.    Allergies:  Allergies  Allergen Reactions  . Aspirin Other (See Comments)    Burns stomach   Medical History:  She has ADD (attention deficit disorder); Bipolar 1 disorder, depressed (HCC); GERD (gastroesophageal reflux disease); and Smoker unmotivated to quit on their problem list. Health Maintenance:   Immunization History  Administered Date(s) Administered  . PPD Test 02/13/2015, 04/13/2017  . Tdap 02/13/2015    Tetanus: 2016 Pneumovax: will give today Flu vaccine: declines  LMP: No LMP recorded (lmp unknown). Patient is postmenopausal. Pap: last 13+ years ago, will refer to GYN after discussion  MGM: patient will schedule DEXA: n/a Colonoscopy: -  EGD: -   Last Dental Exam: Dr. Garner Nash, last visit 2017, needs to schedule Last Eye Exam: last visit 2018   Patient Care Team: [name], MD  as PCP - General (Internal Medicine) Sallye Lat, MD as Consulting Physician (Optometry) Mitchel Honour, DO as Consulting Physician (Obstetrics and Gynecology) Andee Poles, MD as Consulting Physician (Psychiatry)  Surgical History:  She has a past surgical history that includes Wisdom tooth extraction. Family History:  Herfamily history includes Diabetes in her mother; Heart disease in her mother. She was adopted. Social History:  She reports that she has been smoking cigarettes. She started smoking about 29 years ago. She has a 29.00 pack-year smoking history. She has never used smokeless tobacco. She reports that she drinks about 14.0 standard drinks of alcohol per week. She reports that she has current or past drug history. Drug: Marijuana.  Review of Systems:  Review of Systems  Constitutional:  Negative for malaise/fatigue and weight loss.  HENT: Negative for hearing loss and tinnitus.   Eyes: Negative for blurred vision and double vision.  Respiratory: Negative for cough, shortness of breath and wheezing.   Cardiovascular: Negative for chest pain, palpitations, orthopnea, claudication and leg swelling.  Gastrointestinal: Negative for abdominal pain, blood in stool, constipation, diarrhea, heartburn, melena, nausea and vomiting.  Genitourinary: Negative.   Musculoskeletal: Negative for falls, joint pain and myalgias.  Skin: Negative for rash.  Neurological: Negative for dizziness, tingling, sensory change, weakness and headaches.  Endo/Heme/Allergies: Negative for polydipsia.  Psychiatric/Behavioral: Positive for depression. Negative for hallucinations, memory loss, substance abuse and suicidal ideas. The patient is nervous/anxious. The patient does not have insomnia.   All other systems reviewed and are negative.   Physical Exam: Estimated body mass index is 26.53 kg/m as calculated from the following:   Height as of this encounter: 5' 4.75" (1.645 m).   Weight as of  this encounter: 158 lb 3.2 oz (71.8 kg). BP 108/70   Pulse 84   Temp 97.7 F (36.5 C)   Ht 5' 4.75" (1.645 m)   Wt 158 lb 3.2 oz (71.8 kg)   LMP  (LMP Unknown)   SpO2 99%   BMI 26.53 kg/m  General Appearance: Well nourished, in no apparent distress.  Eyes: PERRLA, EOMs, conjunctiva no swelling or erythema, normal fundi and vessels.  Sinuses: No Frontal/maxillary tenderness  ENT/Mouth: Ext aud canals clear, normal light reflex with TMs without erythema, bulging. Good dentition. No erythema, swelling, or exudate on post pharynx. Tonsils not swollen or erythematous. Hearing normal.  Neck: Supple, thyroid normal. No bruits  Respiratory: Respiratory effort normal, BS equal bilaterally without rales, rhonchi, wheezing or stridor.  Cardio: RRR without murmurs, rubs or gallops. Brisk peripheral pulses without edema.  Chest: symmetric, with normal excursions and percussion.  Breasts: Declines, defer to GYN Abdomen: Soft, nontender, no guarding, rebound, hernias, masses, or organomegaly.  Lymphatics: Non tender without lymphadenopathy.  Genitourinary: Defer to GYN, patient declines today Musculoskeletal: Full ROM all peripheral extremities,5/5 strength, and normal gait.  Skin: Warm, dry without rashes, lesions, ecchymosis. Neuro: Cranial nerves intact, reflexes equal bilaterally. Normal muscle tone, no cerebellar symptoms. Sensation intact.  Psych: Awake and oriented X 3, normal affect, Insight and Judgment appropriate.   EKG: WNL no ST changes.  Dan Maker 10:33 AM [facility]

## 2018-10-06 ENCOUNTER — Encounter: Payer: Self-pay | Admitting: Adult Health

## 2018-10-06 ENCOUNTER — Ambulatory Visit (INDEPENDENT_AMBULATORY_CARE_PROVIDER_SITE_OTHER): Payer: BLUE CROSS/BLUE SHIELD | Admitting: Adult Health

## 2018-10-06 VITALS — BP 108/70 | HR 84 | Temp 97.7°F | Ht 64.75 in | Wt 158.2 lb

## 2018-10-06 DIAGNOSIS — Z1389 Encounter for screening for other disorder: Secondary | ICD-10-CM | POA: Diagnosis not present

## 2018-10-06 DIAGNOSIS — Z1329 Encounter for screening for other suspected endocrine disorder: Secondary | ICD-10-CM

## 2018-10-06 DIAGNOSIS — F988 Other specified behavioral and emotional disorders with onset usually occurring in childhood and adolescence: Secondary | ICD-10-CM

## 2018-10-06 DIAGNOSIS — Z124 Encounter for screening for malignant neoplasm of cervix: Secondary | ICD-10-CM

## 2018-10-06 DIAGNOSIS — F319 Bipolar disorder, unspecified: Secondary | ICD-10-CM

## 2018-10-06 DIAGNOSIS — E663 Overweight: Secondary | ICD-10-CM | POA: Insufficient documentation

## 2018-10-06 DIAGNOSIS — K219 Gastro-esophageal reflux disease without esophagitis: Secondary | ICD-10-CM

## 2018-10-06 DIAGNOSIS — Z0000 Encounter for general adult medical examination without abnormal findings: Secondary | ICD-10-CM | POA: Diagnosis not present

## 2018-10-06 DIAGNOSIS — E559 Vitamin D deficiency, unspecified: Secondary | ICD-10-CM

## 2018-10-06 DIAGNOSIS — Z130 Encounter for screening for diseases of the blood and blood-forming organs and certain disorders involving the immune mechanism: Secondary | ICD-10-CM | POA: Diagnosis not present

## 2018-10-06 DIAGNOSIS — Z0001 Encounter for general adult medical examination with abnormal findings: Secondary | ICD-10-CM

## 2018-10-06 DIAGNOSIS — Z6832 Body mass index (BMI) 32.0-32.9, adult: Secondary | ICD-10-CM | POA: Insufficient documentation

## 2018-10-06 DIAGNOSIS — Z131 Encounter for screening for diabetes mellitus: Secondary | ICD-10-CM | POA: Diagnosis not present

## 2018-10-06 DIAGNOSIS — Z13220 Encounter for screening for lipoid disorders: Secondary | ICD-10-CM

## 2018-10-06 DIAGNOSIS — Z79899 Other long term (current) drug therapy: Secondary | ICD-10-CM

## 2018-10-06 DIAGNOSIS — F17200 Nicotine dependence, unspecified, uncomplicated: Secondary | ICD-10-CM | POA: Insufficient documentation

## 2018-10-06 DIAGNOSIS — Z23 Encounter for immunization: Secondary | ICD-10-CM | POA: Diagnosis not present

## 2018-10-06 DIAGNOSIS — D649 Anemia, unspecified: Secondary | ICD-10-CM

## 2018-10-06 DIAGNOSIS — E669 Obesity, unspecified: Secondary | ICD-10-CM | POA: Insufficient documentation

## 2018-10-06 HISTORY — DX: Nicotine dependence, unspecified, uncomplicated: F17.200

## 2018-10-06 NOTE — Patient Instructions (Addendum)
Follow up with GYN - if PAP and HPV screening is negative, you are cleared for 5 years  Speak with Dr. Yetta Barre about your concerns with weight gain on new medication, and that anxiety isn't much improved - write a list if needed   HOW TO SCHEDULE A MAMMOGRAM  [facility]  7 a.m.-6:30 p.m., Monday 7 a.m.-5 p.m., Tuesday-Friday Schedule an appointment by calling [phone].  [facility] Schedule an appointment by calling [phone].     Know what a healthy weight is for you (roughly BMI <25) and aim to maintain this  Aim for 7+ servings of fruits and vegetables daily  65-80+ fluid ounces of water or unsweet tea for healthy kidneys  Limit to max 1 drink of alcohol per day; avoid smoking/tobacco  Limit animal fats in diet for cholesterol and heart health - choose grass fed whenever available  Avoid highly processed foods, and foods high in saturated/trans fats  Aim for low stress - take time to unwind and care for your mental health  Aim for 150 min of moderate intensity exercise weekly for heart health, and weights twice weekly for bone health  Aim for 7-9 hours of sleep daily       When it comes to diets, agreement about the perfect plan isn't easy to find, even among the experts. Experts at the [facility] [employer] developed an idea known as the Healthy Eating Plate. Just imagine a plate divided into logical, healthy portions.  The emphasis is on diet quality:  Load up on vegetables and fruits - one-half of your plate: Aim for color and variety, and remember that potatoes don't count.  Go for whole grains - one-quarter of your plate: Whole wheat, barley, wheat berries, quinoa, oats, brown rice, and foods made with them. If you want pasta, go with whole wheat pasta.  Protein power - one-quarter of your plate: Fish, chicken, beans, and nuts are all healthy, versatile protein sources. Limit red meat.  The diet, however,  does go beyond the plate, offering a few other suggestions.  Use healthy plant oils, such as olive, canola, soy, corn, sunflower and peanut. Check the labels, and avoid partially hydrogenated oil, which have unhealthy trans fats.  If you're thirsty, drink water. Coffee and tea are good in moderation, but skip sugary drinks and limit milk and dairy products to one or two daily servings.  The type of carbohydrate in the diet is more important than the amount. Some sources of carbohydrates, such as vegetables, fruits, whole grains, and beans-are healthier than others.  Finally, stay active.

## 2018-10-07 LAB — LIPID PANEL
CHOL/HDL RATIO: 2.2 (calc) (ref <5.0)
Cholesterol: 190 mg/dL (ref <200)
HDL: 85 mg/dL (ref >50)
LDL Cholesterol (Calc): 87 mg/dL (calc)
NON-HDL CHOLESTEROL (CALC): 105 mg/dL (ref <130)
Triglycerides: 88 mg/dL (ref <150)

## 2018-10-07 LAB — CBC WITH DIFFERENTIAL/PLATELET
Basophils Absolute: 52 cells/uL (ref 0–200)
Basophils Relative: 1 %
EOS PCT: 0 %
Eosinophils Absolute: 0 cells/uL — ABNORMAL LOW (ref 15–500)
HCT: 38.1 % (ref 35.0–45.0)
Hemoglobin: 13.1 g/dL (ref 11.7–15.5)
Lymphs Abs: 2070 cells/uL (ref 850–3900)
MCH: 32.8 pg (ref 27.0–33.0)
MCHC: 34.4 g/dL (ref 32.0–36.0)
MCV: 95.3 fL (ref 80.0–100.0)
MPV: 9.3 fL (ref 7.5–12.5)
Monocytes Relative: 9.7 %
NEUTROS PCT: 49.5 %
Neutro Abs: 2574 cells/uL (ref 1500–7800)
Platelets: 263 10*3/uL (ref 140–400)
RBC: 4 10*6/uL (ref 3.80–5.10)
RDW: 12.2 % (ref 11.0–15.0)
TOTAL LYMPHOCYTE: 39.8 %
WBC mixed population: 504 cells/uL (ref 200–950)
WBC: 5.2 10*3/uL (ref 3.8–10.8)

## 2018-10-07 LAB — COMPLETE METABOLIC PANEL WITH GFR
AG RATIO: 2.1 (calc) (ref 1.0–2.5)
ALKALINE PHOSPHATASE (APISO): 83 U/L (ref 33–115)
ALT: 20 U/L (ref 6–29)
AST: 18 U/L (ref 10–35)
Albumin: 4.7 g/dL (ref 3.6–5.1)
BILIRUBIN TOTAL: 0.3 mg/dL (ref 0.2–1.2)
BUN: 14 mg/dL (ref 7–25)
CALCIUM: 9.9 mg/dL (ref 8.6–10.2)
CHLORIDE: 104 mmol/L (ref 98–110)
CO2: 28 mmol/L (ref 20–32)
Creat: 0.83 mg/dL (ref 0.50–1.10)
GFR, Est African American: 99 mL/min/{1.73_m2} (ref >=60)
GFR, Est Non African American: 85 mL/min/{1.73_m2} (ref >=60)
Globulin: 2.2 g/dL (calc) (ref 1.9–3.7)
Glucose, Bld: 86 mg/dL (ref 65–99)
POTASSIUM: 4.6 mmol/L (ref 3.5–5.3)
Sodium: 142 mmol/L (ref 135–146)
Total Protein: 6.9 g/dL (ref 6.1–8.1)

## 2018-10-07 LAB — IRON, TOTAL/TOTAL IRON BINDING CAP
%SAT: 26 % (calc) (ref 16–45)
IRON: 101 ug/dL (ref 40–190)
TIBC: 395 ug/dL (ref 250–450)

## 2018-10-07 LAB — VITAMIN D 25 HYDROXY (VIT D DEFICIENCY, FRACTURES): VIT D 25 HYDROXY: 43 ng/mL (ref 30–100)

## 2018-10-07 LAB — MAGNESIUM: Magnesium: 2 mg/dL (ref 1.5–2.5)

## 2018-10-07 LAB — HEMOGLOBIN A1C
EAG (MMOL/L): 5 (calc)
HEMOGLOBIN A1C: 4.8 %{Hb} (ref <5.7)
Mean Plasma Glucose: 91 (calc)

## 2018-10-07 LAB — VITAMIN B12: Vitamin B-12: 371 pg/mL (ref 200–1100)

## 2018-10-07 LAB — TSH: TSH: 1.55 mIU/L

## 2018-10-11 ENCOUNTER — Telehealth: Payer: Self-pay | Admitting: Obstetrics & Gynecology

## 2018-10-11 DIAGNOSIS — F333 Major depressive disorder, recurrent, severe with psychotic symptoms: Secondary | ICD-10-CM | POA: Diagnosis not present

## 2018-10-11 NOTE — Telephone Encounter (Signed)
Called and left a message for patient to call back to schedule a new patient doctor referral appointment with our office to see any provider for an annual wellness exam. °

## 2018-10-12 NOTE — Telephone Encounter (Signed)
Called and left a message for patient to call back to schedule a new patient doctor referral appointment with our office to see any provider for an annual wellness exam. °

## 2018-10-18 DIAGNOSIS — F333 Major depressive disorder, recurrent, severe with psychotic symptoms: Secondary | ICD-10-CM | POA: Diagnosis not present

## 2018-10-20 NOTE — Telephone Encounter (Signed)
Called and left a message for patient to call back to schedule a new patient doctor referral appointment with our office to see any provider for an annual wellness exam. °

## 2018-10-22 ENCOUNTER — Emergency Department (HOSPITAL_COMMUNITY): Payer: BLUE CROSS/BLUE SHIELD

## 2018-10-22 ENCOUNTER — Encounter (HOSPITAL_COMMUNITY): Payer: Self-pay | Admitting: Emergency Medicine

## 2018-10-22 ENCOUNTER — Emergency Department (HOSPITAL_COMMUNITY)
Admission: EM | Admit: 2018-10-22 | Discharge: 2018-10-22 | Disposition: A | Discharge status: Home / Self Care | Payer: BLUE CROSS/BLUE SHIELD | Attending: Emergency Medicine | Admitting: Emergency Medicine

## 2018-10-22 DIAGNOSIS — Y92410 Unspecified street and highway as the place of occurrence of the external cause: Secondary | ICD-10-CM | POA: Diagnosis not present

## 2018-10-22 DIAGNOSIS — S79911A Unspecified injury of right hip, initial encounter: Secondary | ICD-10-CM | POA: Diagnosis not present

## 2018-10-22 DIAGNOSIS — F17210 Nicotine dependence, cigarettes, uncomplicated: Secondary | ICD-10-CM | POA: Diagnosis not present

## 2018-10-22 DIAGNOSIS — Y999 Unspecified external cause status: Secondary | ICD-10-CM | POA: Diagnosis not present

## 2018-10-22 DIAGNOSIS — M25551 Pain in right hip: Secondary | ICD-10-CM | POA: Insufficient documentation

## 2018-10-22 DIAGNOSIS — S60511A Abrasion of right hand, initial encounter: Secondary | ICD-10-CM | POA: Diagnosis not present

## 2018-10-22 DIAGNOSIS — S6991XA Unspecified injury of right wrist, hand and finger(s), initial encounter: Secondary | ICD-10-CM | POA: Diagnosis not present

## 2018-10-22 DIAGNOSIS — Y9389 Activity, other specified: Secondary | ICD-10-CM | POA: Diagnosis not present

## 2018-10-22 DIAGNOSIS — M79641 Pain in right hand: Secondary | ICD-10-CM

## 2018-10-22 DIAGNOSIS — R457 State of emotional shock and stress, unspecified: Secondary | ICD-10-CM | POA: Diagnosis not present

## 2018-10-22 MED ORDER — METHOCARBAMOL 500 MG PO TABS: 500.0000 mg | ORAL_TABLET | Freq: Two times a day (BID) | ORAL | 0 refills | Status: DC | PRN

## 2018-10-22 MED ORDER — IBUPROFEN 600 MG PO TABS: 600.0000 mg | ORAL_TABLET | Freq: Three times a day (TID) | ORAL | 0 refills | Status: DC | PRN
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
## 2018-10-22 NOTE — Discharge Instructions (Signed)

## 2018-11-01 DIAGNOSIS — F333 Major depressive disorder, recurrent, severe with psychotic symptoms: Secondary | ICD-10-CM | POA: Diagnosis not present

## 2018-11-04 DIAGNOSIS — F411 Generalized anxiety disorder: Secondary | ICD-10-CM | POA: Diagnosis not present

## 2018-11-04 DIAGNOSIS — F900 Attention-deficit hyperactivity disorder, predominantly inattentive type: Secondary | ICD-10-CM | POA: Diagnosis not present

## 2018-11-05 ENCOUNTER — Other Ambulatory Visit: Payer: Self-pay | Admitting: Adult Health

## 2018-11-05 MED ORDER — PHENTERMINE HCL 37.5 MG PO TABS: ORAL_TABLET | ORAL | 0 refills | Status: DC

## 2018-11-05 NOTE — Progress Notes (Signed)
Patient is very concerned about weight, difficulty controlling appetite; phentermine was discussed at last visit, but I discussed my concern with initiating this medication due to her mental health history, and that I would defer to Dr. Deatra RobinsonKaren Jones regarding recommendations on acceptable weight loss aids. She called to report she was told that phentermine would be acceptable. I discussed with Dr. Oneta RackMckeown, will prescribe 30 day supply and follow up in 1 month to evaluate tolerance/progress.

## 2018-11-09 DIAGNOSIS — F333 Major depressive disorder, recurrent, severe with psychotic symptoms: Secondary | ICD-10-CM | POA: Diagnosis not present

## 2018-11-17 DIAGNOSIS — F333 Major depressive disorder, recurrent, severe with psychotic symptoms: Secondary | ICD-10-CM | POA: Diagnosis not present

## 2018-11-25 DIAGNOSIS — F333 Major depressive disorder, recurrent, severe with psychotic symptoms: Secondary | ICD-10-CM | POA: Diagnosis not present

## 2018-11-29 DIAGNOSIS — F333 Major depressive disorder, recurrent, severe with psychotic symptoms: Secondary | ICD-10-CM | POA: Diagnosis not present

## 2018-12-13 DIAGNOSIS — F333 Major depressive disorder, recurrent, severe with psychotic symptoms: Secondary | ICD-10-CM | POA: Diagnosis not present

## 2018-12-13 NOTE — Progress Notes (Signed)
Assessment and Plan:  Angelica Weeks was seen today for follow-up.  Diagnoses and all orders for this visit:  Overweight (BMI 25.0-29.9) Long discussion about weight loss, diet, and exercise Recommended diet heavy in fruits and veggies and low in animal meats, cheeses, and dairy products, appropriate calorie intake Patient will work on improving diet, increasing exercise, water intake Discussed appropriate weight for height Patient on phentermine with benefit and no SE, taking drug breaks; continue close follow up. -     phentermine (ADIPEX-P) 37.5 MG tablet; Take 1/2 to 1 tablet every morning for dieting & weightloss. Works best if not taken daily, take frequent breaks.  Smoker unmotivated to quit Discussed risks associated with tobacco use and advised to reduce or quit Patient is not ready to do so, but advised to consider strongly Will follow up at the next visit  At risk for for altered cardiovascular status -     EKG 12-Lead  Further disposition pending results of labs. Discussed med's effects and SE's.   Over 15 minutes of exam, counseling, chart review, and critical decision making was performed.   Future Appointments  Date Time Provider Department Center  12/17/2018 12:45 PM Jerene BearsMiller, Mary S, MD [facility] None  10/10/2019 10:00 AM Judd Gaudierorbett, Breleigh, NP [facility] None   ------------------------------------------------------------------------------------------------------------------  HPI BP 130/72   Pulse 88   Temp 97.7 F (36.5 C)   Ht 5' 4.75" (1.645 m)   Wt 160 lb (72.6 kg)   LMP  (LMP Unknown) Comment: "I've not had a period in 3-4 years"  SpO2 99%   BMI 26.83 kg/m   47 y.o.female presents for 1 month follow up after initiation of phentermine.   Patient expressed concern about weight, difficulty controlling appetite; phentermine was discussed at last visit, but I discussed my concern with initiating this medication due to her mental health history, and that I would defer to  Dr. Deatra RobinsonKaren Jones regarding recommendations on acceptable weight loss aids. She called to report she was told that phentermine would be acceptable. I discussed with Dr. Oneta RackMckeown, and prescribed 30 day supply of phentermine. She presents today for progress monitoring.   she is prescribed phentermine for weight loss.  While on the medication they have lost 0 lbs since last visit. They deny palpitations, anxiety, trouble sleeping, elevated BP.   BMI is Body mass index is 26.83 kg/m., she is working on diet and exercise. Wt Readings from Last 3 Encounters:  12/14/18 160 lb (72.6 kg)  10/22/18 156 lb (70.8 kg)  10/06/18 158 lb 3.2 oz (71.8 kg)   Typical breakfast: coffee with 2 tsp of sugar and powdered cream Typical lunch: Pack of crackers, or may go out and have a sandwich (rheuben) Snack: pack of crackers/doritos, etc Typical dinner: vegetables, chicken, hamburgers (small) Exercise: Newly walking her dog 1/2 mile twice daily  Water intake: 2 bottles  Sleep: minimum of 8 hours    Past Medical History:  Diagnosis Date  . ADD (attention deficit disorder)   . Bipolar 1 disorder, depressed (HCC)   . Cannabis abuse with psychotic disorder with delusions (HCC) 05/22/2018  . Cellulitis of left eyelid 01/2014  . Depression   . Paranoid delusion (HCC) 05/22/2018     Allergies  Allergen Reactions  . Aspirin Other (See Comments)    Burns stomach    Current Outpatient Medications on File Prior to Visit  Medication Sig  . ALPRAZolam (XANAX) 1 MG tablet Take 1 mg by mouth 3 (three) times daily as needed for anxiety.   .Marland Kitchen  Cariprazine HCl (VRAYLAR) 6 MG CAPS Take by mouth as needed.  . cyclobenzaprine (FLEXERIL) 5 MG tablet TAKE 1 TABLET(5 MG) BY MOUTH THREE TIMES DAILY AS NEEDED FOR MUSCLE SPASMS  . esomeprazole (NEXIUM) 40 MG capsule TAKE ONE CAPSULE BY MOUTH EVERY DAY  . ibuprofen (ADVIL,MOTRIN) 600 MG tablet Take 1 tablet (600 mg total) by mouth every 8 (eight) hours as needed for moderate pain.   Marland Kitchen. lamoTRIgine (LAMICTAL) 200 MG tablet Take 400 mg by mouth at bedtime.   . methocarbamol (ROBAXIN) 500 MG tablet Take 1 tablet (500 mg total) by mouth 2 (two) times daily as needed for muscle spasms.  Marland Kitchen. OLANZapine (ZYPREXA) 20 MG tablet Take 20 mg by mouth at bedtime.  . ondansetron (ZOFRAN) 4 MG tablet TAKE 1 TABLET(4 MG) BY MOUTH DAILY AS NEEDED FOR NAUSEA OR VOMITING  . traZODone (DESYREL) 100 MG tablet Take 100 mg by mouth. Takes 200 mg at bedtime.  Marland Kitchen. amphetamine-dextroamphetamine (ADDERALL) 20 MG tablet Take 20 mg by mouth 3 (three) times daily as needed.   . phentermine (ADIPEX-P) 37.5 MG tablet Take 1/2 to 1 tablet every morning for dieting & weightloss. Works best if not taken daily, take frequent breaks. (Patient not taking: Reported on 12/14/2018)   No current facility-administered medications on file prior to visit.     ROS: all negative except above.   Physical Exam:  BP 130/72   Pulse 88   Temp 97.7 F (36.5 C)   Ht 5' 4.75" (1.645 m)   Wt 160 lb (72.6 kg)   LMP  (LMP Unknown) Comment: "I've not had a period in 3-4 years"  SpO2 99%   BMI 26.83 kg/m   General Appearance: Well nourished, in no apparent distress. Eyes: PERRLA, EOMs, conjunctiva no swelling or erythema Sinuses: No Frontal/maxillary tenderness ENT/Mouth: Ext aud canals clear, TMs without erythema, bulging. No erythema, swelling, or exudate on post pharynx.  Tonsils not swollen or erythematous. Hearing normal.  Neck: Supple, thyroid normal.  Respiratory: Respiratory effort normal, BS equal bilaterally without rales, rhonchi, wheezing or stridor.  Cardio: RRR with no MRGs. Brisk peripheral pulses without edema.  Abdomen: Soft, + BS.  Non tender, no guarding, rebound, hernias, masses. Lymphatics: Non tender without lymphadenopathy.  Musculoskeletal: Full ROM, 5/5 strength, normal gait.  Skin: Warm, dry without rashes, lesions, ecchymosis.  Neuro: Cranial nerves intact. Normal muscle tone, no cerebellar  symptoms. Sensation intact.  Psych: Awake and oriented X 3, normal affect, Insight and Judgment appropriate.     Dan MakerAshley C Astrid Vides, NP 9:33 AM [facility]

## 2018-12-14 ENCOUNTER — Ambulatory Visit (INDEPENDENT_AMBULATORY_CARE_PROVIDER_SITE_OTHER): Payer: BLUE CROSS/BLUE SHIELD | Admitting: Adult Health

## 2018-12-14 ENCOUNTER — Encounter: Payer: Self-pay | Admitting: Adult Health

## 2018-12-14 VITALS — BP 130/72 | HR 88 | Temp 97.7°F | Ht 64.75 in | Wt 160.0 lb

## 2018-12-14 DIAGNOSIS — E663 Overweight: Secondary | ICD-10-CM

## 2018-12-14 DIAGNOSIS — F17200 Nicotine dependence, unspecified, uncomplicated: Secondary | ICD-10-CM

## 2018-12-14 DIAGNOSIS — Z9189 Other specified personal risk factors, not elsewhere classified: Secondary | ICD-10-CM

## 2018-12-14 MED ORDER — PHENTERMINE HCL 37.5 MG PO TABS: ORAL_TABLET | ORAL | 0 refills | Status: DC

## 2018-12-14 NOTE — Patient Instructions (Signed)
Try to limit processed carbs (sugar, flour) products as much as possible  Aim for high fiber, healthy (unsaturated) fats (such as avocado, salmon, olive oil, nuts), and good lean proteins (beans, chicken, [place], fish, etc)     Aim for 7+ servings (1/2 cup each) of fruits and vegetables daily  65-80+ fluid ounces of water or unsweet tea for healthy kidneys  Limit to max 1 drink of alcohol per day; avoid smoking/tobacco  Limit animal fats in diet for cholesterol and heart health - choose grass fed whenever available  Avoid highly processed foods, and foods high in saturated/trans fats  Aim for low stress - take time to unwind and care for your mental health  Aim for 150 min of moderate intensity exercise weekly for heart health, and weights twice weekly for bone health  Aim for 7-9 hours of sleep daily       When it comes to diets, agreement about the perfect plan isn't easy to find, even among the experts. Experts at the [employer] developed an idea known as the Healthy Eating Plate. Just imagine a plate divided into logical, healthy portions.  The emphasis is on diet quality:  Load up on vegetables and fruits - one-half of your plate: Aim for color and variety, and remember that potatoes don't count.  Go for whole grains - one-quarter of your plate: Whole wheat, barley, wheat berries, quinoa, oats, brown rice, and foods made with them. If you want pasta, go with whole wheat pasta.  Protein power - one-quarter of your plate: Fish, chicken, beans, and nuts are all healthy, versatile protein sources. Limit red meat.  The diet, however, does go beyond the plate, offering a few other suggestions.  Use healthy plant oils, such as olive, canola, soy, corn, sunflower and peanut. Check the labels, and avoid partially hydrogenated oil, which have unhealthy trans fats.  If you're thirsty, drink water. Coffee and tea are good in moderation, but skip sugary drinks  and limit milk and dairy products to one or two daily servings.  The type of carbohydrate in the diet is more important than the amount. Some sources of carbohydrates, such as vegetables, fruits, whole grains, and beans-are healthier than others.  Finally, stay active.  SMART goals Having a solid fitness goal is an amazing way to power you towards success, but not all goals are created equal. While it's great to have an end-game in mind, there are some best practices when it comes to goal setting. Whether you want to lose weight, improve your fitness level, or train for an event, putting the SMART method into action can help you achieve what you set out to do.  SMART stands for specific, measurable, attainable, relevant, and timely-all of which are important in reaching a fitness objective. SMART goals can help keep you on track and remind you of your priorities, so you're able to follow through with every workout or healthy meal you have planned.   Get SMART and put these five elements into action when you're setting your fitness goal.  1. Specific  You need something that's not too arbitrary. For example, a bad goal would be, say, 'get healthy'. A specific goal would be to lose weight. You'll narrow down that goal even further by using the rest of the method, but whether you want to get stronger, faster, or smaller, having a baseline points you in the right direction.  2. Measurable  Here's where you determine exactly how you'll  measure your goal. If you're going to follow the bad goal, it would be get really healthy.That's not quantifiable. A measurable goal would be, say, 'lose 10 pounds'. You can quantify your progress, and you can sort of back into a time frame once you have that. Your goal may be to master a pull-up, run five miles, or go to the gym four days a week-whatever it is, you should have a definite way of knowing when you've reached your goal.  3. Attainable  While it can be  helpful to set big-picture goals in the long-term, you need a more achievable goal on the horizon to keep you on track. You want to start small and see early wins, which encourages long-term consistency.  If you set something too lofty right off the bat, it might be discouraging to not make progress as fast as you would like. You should also consider the size of your goal-for example, a goal of losing 30 pounds in one month just isn't going to happen, so you're better off setting smaller goals that are in closer reach.  4. Relevant  This is where things get a little tricky, finding your "why" is easier said than done. Ask yourself, 'is this goal worthwhile, and am I motivated to do it?' Creating a goal with some type of motivation attached to it, like I want to lose 10 pounds in two months to be ready for my wedding, can give a bit of relevancy to your goal. Whether you want to feel confident at a big event or perform better during everyday activities, pinpoint why a goal is important to you.  5. Timely  You want to be strict about a deadline-doing so creates urgency. It's also important not to set your sights too far out. If you give yourself four months to lose 10 pounds, that might be too long because you aren't incentivized to start working at it immediately. Instead, consider setting smaller goals along the way, like "I want to lose three pounds in two weeks." Maybe running a marathon is your long-term goal, but if you've never been a runner, signing up for one that's a month away isn't realistic-instead, set smaller mileage goals for shorter time periods and work your way up.  You should also be honest with yourself about what you're able to accomplish in a given time frame. You just need to adjust your expectations so they're in line with your schedule and commitments.  Once you have your goal in place, it's all about the follow-through. Whether you want to lose one pound a week, be able to do  five full push-ups in two weeks, or run a 5K in under 30 minutes in four weeks, you can come up with a plan to help get your where you want to go-but it all starts with deciding what you want. Be accountable to yourself, stay consistent, and the results will follow.  So try the exercise at home and at your next visit come in with your SMART goal and we can discuss how together we can achieve this goal!

## 2018-12-17 ENCOUNTER — Encounter: Payer: Self-pay | Admitting: Obstetrics & Gynecology

## 2018-12-17 ENCOUNTER — Other Ambulatory Visit (HOSPITAL_COMMUNITY)
Admission: RE | Admit: 2018-12-17 | Discharge: 2018-12-17 | Disposition: A | Discharge status: Home / Self Care | Payer: BLUE CROSS/BLUE SHIELD | Source: Ambulatory Visit | Attending: Obstetrics & Gynecology | Admitting: Obstetrics & Gynecology

## 2018-12-17 ENCOUNTER — Ambulatory Visit: Payer: BLUE CROSS/BLUE SHIELD | Admitting: Obstetrics & Gynecology

## 2018-12-17 VITALS — BP 118/68 | HR 84 | Resp 14 | Ht 64.5 in | Wt 159.0 lb

## 2018-12-17 DIAGNOSIS — Z01419 Encounter for gynecological examination (general) (routine) without abnormal findings: Secondary | ICD-10-CM

## 2018-12-17 DIAGNOSIS — E229 Hyperfunction of pituitary gland, unspecified: Secondary | ICD-10-CM

## 2018-12-17 DIAGNOSIS — Z124 Encounter for screening for malignant neoplasm of cervix: Secondary | ICD-10-CM

## 2018-12-17 DIAGNOSIS — R7989 Other specified abnormal findings of blood chemistry: Secondary | ICD-10-CM

## 2018-12-17 DIAGNOSIS — N912 Amenorrhea, unspecified: Secondary | ICD-10-CM

## 2018-12-17 MED ORDER — LAMOTRIGINE 200 MG PO TABS: 400.0000 mg | ORAL_TABLET | Freq: Every day | ORAL | Status: AC

## 2018-12-17 NOTE — Progress Notes (Signed)
48 y.o. G2 P2 WF here to establish care as new patient.  Stopped. 3-4 years ago.  She has not done any testing to determine cause.  Does have some heat intolerance.  Doesn't really have hot flashes.  Has not had a pelvic exam in 13-14 years.  She is adopted so does not have any family hx.    Together with significant other x 14 years.    PCP:  Judd GaudierAshley Corbett, NP.  Blood work done 10/06/18.  Reviewed with pt.    No LMP recorded (lmp unknown). Patient is postmenopausal.          Sexually active: Yes.    The current method of family planning is none.    Exercising: Yes.    Walk about half mile a day  Smoker:  yes  Health Maintenance: Pap:  None about 12-13 years ago  History of abnormal Pap:  no MMG:  no Colonoscopy:  no BMD:   no TDaP:  02/13/2015 Pneumonia vaccine(s):  Pneumovax 10/19 Screening Labs: testing for amenorrhea will be done today   reports that she has been smoking cigarettes. She started smoking about 30 years ago. She has a 29.00 pack-year smoking history. She has never used smokeless tobacco. She reports current alcohol use of about 14.0 standard drinks of alcohol per week. She reports previous drug use. Drug: Marijuana.  Past Medical History:  Diagnosis Date  . ADD (attention deficit disorder)   . Bipolar 1 disorder, depressed (HCC)   . Cannabis abuse with psychotic disorder with delusions (HCC) 05/22/2018  . Cellulitis of left eyelid 01/2014  . Depression   . Dysmenorrhea   . Paranoid delusion (HCC) 05/22/2018    Past Surgical History:  Procedure Laterality Date  . WISDOM TOOTH EXTRACTION      Current Outpatient Medications  Medication Sig Dispense Refill  . Cariprazine HCl (VRAYLAR) 6 MG CAPS Take by mouth.    . phentermine (ADIPEX-P) 37.5 MG tablet Take 1/2 to 1 tablet every morning for dieting & weightloss. Works best if not taken daily, take frequent breaks. 30 tablet 0  . traZODone (DESYREL) 100 MG tablet Take 100 mg by mouth. Takes 200 mg at bedtime.    .  lamoTRIgine (LAMICTAL) 200 MG tablet Take 2 tablets (400 mg total) by mouth at bedtime.     No current facility-administered medications for this visit.     Family History  Adopted: Yes  Problem Relation Age of Onset  . Diabetes Mother   . Heart disease Mother     Review of Systems  All other systems reviewed and are negative.   Exam:   BP 118/68   Pulse 84   Resp 14   Ht 5' 4.5" (1.638 m)   Wt 159 lb (72.1 kg)   LMP  (LMP Unknown) Comment: "I've not had a period in 3-4 years"  BMI 26.87 kg/m    Height: 5' 4.5" (163.8 cm)  Ht Readings from Last 3 Encounters:  12/17/18 5' 4.5" (1.638 m)  12/14/18 5' 4.75" (1.645 m)  10/22/18 5' 5.5" (1.664 m)    General appearance: alert, cooperative and appears stated age Head: Normocephalic, without obvious abnormality, atraumatic Neck: no adenopathy, supple, symmetrical, trachea midline and thyroid normal to inspection and palpation Lungs: clear to auscultation bilaterally Breasts: normal appearance, no masses or tenderness Heart: regular rate and rhythm Abdomen: soft, non-tender; bowel sounds normal; no masses,  no organomegaly Extremities: extremities normal, atraumatic, no cyanosis or edema Skin: Skin color, texture, turgor normal.  No rashes or lesions Lymph nodes: Cervical, supraclavicular, and axillary nodes normal. No abnormal inguinal nodes palpated Neurologic: Grossly normal   Pelvic: External genitalia:  no lesions              Urethra:  normal appearing urethra with no masses, tenderness or lesions              Bartholins and Skenes: normal                 Vagina: normal appearing vagina with normal color and discharge, no lesions              Cervix: no lesions              Pap taken: Yes.   Bimanual Exam:  Uterus:  normal size, contour, position, consistency, mobility, non-tender              Adnexa: normal adnexa and no mass, fullness, tenderness               Rectovaginal: Declined today  Chaperone was present  for exam.  A:  Well Woman with normal exam Smoker Amenorrhea x 3-4 years Bipolar d/o  P:   Mammogram guidelines reviewed.  She wants to schedule.  Information provided. pap smear and HR HPV obtained today Prolactin and FSH obtained today Return annually or prn

## 2018-12-18 LAB — FOLLICLE STIMULATING HORMONE: FSH: 94.4 m[IU]/mL

## 2018-12-18 LAB — PROLACTIN: Prolactin: 25 ng/mL — ABNORMAL HIGH (ref 4.8–23.3)

## 2018-12-21 LAB — CYTOLOGY - PAP
DIAGNOSIS: NEGATIVE
HPV (WINDOPATH): NOT DETECTED

## 2018-12-27 DIAGNOSIS — F333 Major depressive disorder, recurrent, severe with psychotic symptoms: Secondary | ICD-10-CM | POA: Diagnosis not present

## 2018-12-31 ENCOUNTER — Telehealth: Payer: Self-pay | Admitting: *Deleted

## 2018-12-31 NOTE — Telephone Encounter (Signed)
Notes recorded by Leda Min, RN on 12/31/2018 at 11:17 AM EST Spoke with patient, was advised now not a good time to talk, will return call to office today. ------

## 2018-12-31 NOTE — Telephone Encounter (Signed)
Spoke with patient, advised as seen below per Dr. Hyacinth Meeker. Lab appt scheduled for 1/20 at 8:45am. Questions answered.  Patient verbalizes understanding and is agreeable.   02 recall.  Encounter closed.

## 2018-12-31 NOTE — Telephone Encounter (Signed)
-----   Message from Jerene Bears, MD sent at 12/31/2018 10:22 AM EST ----- Please let pt know her pap was negative and HR HPV was negative.  02 pap recall.  Her FSH does show she is in menopause and so any future bleeding is abnormal.  In assessing the absence of cycles, I also tested a prolactin.  This was mildly elevated.  I've reviewed all of her medications to make sure this could not cause an elevated prolactin and her medications should not.  So, I want to repeat this first thing in the morning without any breast stimulation for 24 hours.  Advise her don't let hot water beat down on her breasts before the test is done either.  Order placed for repeat prolactin level.  Thanks.  CC:  Gara Kroner, CMA

## 2018-12-31 NOTE — Addendum Note (Signed)
Addended by: Jerene Bears on: 12/31/2018 10:23 AM   Modules accepted: Orders

## 2019-01-03 ENCOUNTER — Other Ambulatory Visit (INDEPENDENT_AMBULATORY_CARE_PROVIDER_SITE_OTHER): Payer: BLUE CROSS/BLUE SHIELD

## 2019-01-03 ENCOUNTER — Other Ambulatory Visit: Payer: Self-pay | Admitting: Obstetrics & Gynecology

## 2019-01-03 DIAGNOSIS — E229 Hyperfunction of pituitary gland, unspecified: Secondary | ICD-10-CM

## 2019-01-03 DIAGNOSIS — R7989 Other specified abnormal findings of blood chemistry: Secondary | ICD-10-CM

## 2019-01-04 LAB — PROLACTIN: Prolactin: 25.8 ng/mL — ABNORMAL HIGH (ref 4.8–23.3)

## 2019-01-05 ENCOUNTER — Telehealth: Payer: Self-pay | Admitting: *Deleted

## 2019-01-05 DIAGNOSIS — N912 Amenorrhea, unspecified: Secondary | ICD-10-CM

## 2019-01-05 DIAGNOSIS — E229 Hyperfunction of pituitary gland, unspecified: Principal | ICD-10-CM

## 2019-01-05 DIAGNOSIS — R7989 Other specified abnormal findings of blood chemistry: Secondary | ICD-10-CM

## 2019-01-05 NOTE — Telephone Encounter (Signed)
Spoke with patient, advised as seen below per Dr. Hyacinth Meeker. Patient agreeable to proceed with MRI Brain with and without contrast, order placed. Advised patient our office will precert, [facility] will contact directly to schedule. Patient verbalizes understanding and is agreeable.  Routing to [employer] for [employer].   Encounter closed.

## 2019-01-05 NOTE — Telephone Encounter (Signed)
-----   Message from Jerene BearsMary S Miller, MD sent at 01/05/2019 12:47 PM EST ----- Please let pt know her prolactin is still elevated.  I have reviewed all of her medications again and cannot see where any of them would increase her prolactin level.  She should proceed with an MRI of the brain to check for an adenoma of the pituitary gland.  Proceed with scheduling is pt is ok with this.

## 2019-01-05 NOTE — Telephone Encounter (Signed)
LM for pt to call back. Please route to Triage if I am not available.  

## 2019-01-06 DIAGNOSIS — F900 Attention-deficit hyperactivity disorder, predominantly inattentive type: Secondary | ICD-10-CM | POA: Diagnosis not present

## 2019-01-06 DIAGNOSIS — F3132 Bipolar disorder, current episode depressed, moderate: Secondary | ICD-10-CM | POA: Diagnosis not present

## 2019-01-06 DIAGNOSIS — F411 Generalized anxiety disorder: Secondary | ICD-10-CM | POA: Diagnosis not present

## 2019-01-10 ENCOUNTER — Telehealth: Payer: Self-pay | Admitting: Obstetrics & Gynecology

## 2019-01-10 DIAGNOSIS — F333 Major depressive disorder, recurrent, severe with psychotic symptoms: Secondary | ICD-10-CM | POA: Diagnosis not present

## 2019-01-10 NOTE — Telephone Encounter (Signed)
Patient called requesting to speak with the nurse. She said [facility] is [facility] gland. Patient expressed concern she has not heard [facility] yet.

## 2019-01-10 NOTE — Telephone Encounter (Signed)
Spoke with Angelica Weeks at [facility], Brain MRI scheduled for 2/1at 2:30pm, arriving at 2:10pm. [facility] [address] location. No prep.  Call returned to patient, advised of appt as seen above. Advised patient MRI will need to be [facility]. Patient verbalizes understanding and is agreeable.   Routing to [employer], [employer].   Encounter closed.

## 2019-01-11 ENCOUNTER — Other Ambulatory Visit: Payer: Self-pay | Admitting: Internal Medicine

## 2019-01-11 ENCOUNTER — Telehealth: Payer: Self-pay | Admitting: Obstetrics & Gynecology

## 2019-01-11 DIAGNOSIS — K219 Gastro-esophageal reflux disease without esophagitis: Secondary | ICD-10-CM

## 2019-01-11 NOTE — Telephone Encounter (Signed)
Spoke with Alvino Chapel at [facility] given authorization number [id number] for MRI Brain- valid thru 01/11/19-02/09/19.

## 2019-01-15 ENCOUNTER — Ambulatory Visit
Admission: RE | Admit: 2019-01-15 | Discharge: 2019-01-15 | Disposition: A | Discharge status: Home / Self Care | Payer: BLUE CROSS/BLUE SHIELD | Source: Ambulatory Visit | Attending: Obstetrics & Gynecology | Admitting: Obstetrics & Gynecology

## 2019-01-15 DIAGNOSIS — R7989 Other specified abnormal findings of blood chemistry: Secondary | ICD-10-CM

## 2019-01-15 DIAGNOSIS — E221 Hyperprolactinemia: Secondary | ICD-10-CM | POA: Diagnosis not present

## 2019-01-15 DIAGNOSIS — E229 Hyperfunction of pituitary gland, unspecified: Secondary | ICD-10-CM

## 2019-01-15 DIAGNOSIS — N912 Amenorrhea, unspecified: Secondary | ICD-10-CM

## 2019-01-15 MED ORDER — GADOBENATE DIMEGLUMINE 529 MG/ML IV SOLN
8.0000 mL | Freq: Once | INTRAVENOUS | Status: AC | PRN
  Administered 2019-01-15: 8 mL via INTRAVENOUS

## 2019-01-17 DIAGNOSIS — F333 Major depressive disorder, recurrent, severe with psychotic symptoms: Secondary | ICD-10-CM | POA: Diagnosis not present

## 2019-01-25 DIAGNOSIS — E229 Hyperfunction of pituitary gland, unspecified: Secondary | ICD-10-CM

## 2019-01-25 DIAGNOSIS — R7989 Other specified abnormal findings of blood chemistry: Secondary | ICD-10-CM | POA: Insufficient documentation

## 2019-01-25 NOTE — Progress Notes (Signed)
Assessment and Plan:  Angelica Weeks was seen today for acute visit.  Diagnoses and all orders for this visit:  Elevated prolactin level (HCC) Very mild elevations, normal MRI, patient denies any symptoms, nipple discharge,  Discussed several medications can cause mild elevations of prolactin After discussion today we will plan to monitor protactin levels in office Can refer to endocrinology if prolactin trending up or she develops symptoms  Overweight Long discussion about weight loss, diet, and exercise Recommended diet heavy in fruits and veggies and low in animal meats, cheeses, and dairy products, appropriate calorie intake Patient will work on increase walking, switch yogurt to greek yogurt, switch white bread to whole grain bread D/c phentermine as hasn't been beneficial, advised that med benefit may be limited if weight gain is SE of new psych med; after long discussion she would still like to try topamax; potential benefit with headaches as well. Discussed risks and SE at length, will try slow taper on and follow up closely in 1 month Discussed appropriate weight for height  Follow up at next visit -     topiramate (TOPAMAX) 25 MG tablet; Take 1 tab 1 hour prior to bedtime; if need higher dose can take second tab with dinner.  Further disposition pending results of labs. Discussed med's effects and SE's.   Over 15 minutes of exam, counseling, chart review, and critical decision making was performed.   Future Appointments  Date Time Provider Department Center  05/17/2019  9:30 AM Judd Gaudier, NP [facility] None  10/10/2019 10:00 AM Judd Gaudier, NP [facility] None    ------------------------------------------------------------------------------------------------------------------   HPI BP 120/64   Pulse 80   Temp (!) 97.3 F (36.3 C)   Ht 5' 4.5" (1.638 m)   Wt 164 lb (74.4 kg)   LMP  (LMP Unknown) Comment: "I've not had a period in 3-4 years"  SpO2 99%   BMI 27.72  kg/m   48 y.o.female presents for follow up of mildly elevated prolactin levels noted at GYN; she is also concerned about ongoing weight gain since starting vraylar by psych.   she is newly prescribed phentermine for weight loss. She has been taking 1 tab daily since last visit and hasn't noted any benefit. While on the medication they have lost 0 lbs since last visit. They deny palpitations, anxiety, trouble sleeping, elevated BP. She has not tolerated wellbutrin in the past.   BMI is Body mass index is 27.72 kg/m., she is working on diet and exercise. Wt Readings from Last 3 Encounters:  01/26/19 164 lb (74.4 kg)  12/17/18 159 lb (72.1 kg)  12/14/18 160 lb (72.6 kg)   Typical breakfast: banana and yoplait, 2 cups of coffee with powdered cream, 3 teaspoons of sugar  Typical lunch: belvita crackers, sandwhich - white bread, mayo, [place] slices, tuna salad, etc  Typical dinner: 2 veggies and a protein - grilled chicken or grilled pork loin  Exercise: walking 1 mile daily as weather allows Water intake: water, keeping soda out of house    GYN checked prolactin and noted mild elevations. FSH was normal. Patient denies breast discharge, does have headaches but these are chronic/ongoing. Denies vision changes. She is on several medications known to affect prolactin levels. We discussed this at length today; MRI was neg.   Component     Latest Ref Rng & Units 12/17/2018 01/03/2019  Prolactin     4.8 - 23.3 ng/mL 25.0 (H) 25.8 (H)    MRI brain W/WO Contrast showed:   IMPRESSION:  Negative exam.  No acute or focal intracranial abnormality.  Study was tailored for examination of the pituitary, and no abnormality was seen. Specifically no evidence for prolactinoma, cavernous sinus lesion, or parasellar/suprasellar mass.  Past Medical History:  Diagnosis Date  . ADD (attention deficit disorder)   . Bipolar 1 disorder, depressed (HCC)   . Cannabis abuse with psychotic disorder with  delusions (HCC) 05/22/2018  . Cellulitis of left eyelid 01/2014  . Depression   . Dysmenorrhea   . Paranoid delusion (HCC) 05/22/2018     Allergies  Allergen Reactions  . Aspirin Other (See Comments)    Burns stomach    Current Outpatient Medications on File Prior to Visit  Medication Sig  . Cariprazine HCl (VRAYLAR) 6 MG CAPS Take by mouth.  . cyclobenzaprine (FLEXERIL) 5 MG tablet Take 5 mg by mouth as needed for muscle spasms.  Marland Kitchen. esomeprazole (NEXIUM) 40 MG capsule Take 1 capsule Daily for Acid Indigestion & Reflux  . lamoTRIgine (LAMICTAL) 200 MG tablet Take 2 tablets (400 mg total) by mouth at bedtime.  . ondansetron (ZOFRAN) 4 MG tablet Take 4 mg by mouth as needed for nausea or vomiting.  . traZODone (DESYREL) 100 MG tablet Take 100 mg by mouth. Takes 200 mg at bedtime.   No current facility-administered medications on file prior to visit.     ROS: all negative except above.   Physical Exam:  BP 120/64   Pulse 80   Temp (!) 97.3 F (36.3 C)   Ht 5' 4.5" (1.638 m)   Wt 164 lb (74.4 kg)   LMP  (LMP Unknown) Comment: "I've not had a period in 3-4 years"  SpO2 99%   BMI 27.72 kg/m   General Appearance: Well nourished, in no apparent distress. Eyes: PERRLA, EOMs, conjunctiva no swelling or erythema Sinuses: No Frontal/maxillary tenderness ENT/Mouth: Ext aud canals clear, TMs without erythema, bulging. No erythema, swelling, or exudate on post pharynx.  Tonsils not swollen or erythematous. Hearing normal.  Neck: Supple, thyroid normal.  Respiratory: Respiratory effort normal, BS equal bilaterally without rales, rhonchi, wheezing or stridor.  Cardio: RRR with no MRGs. Brisk peripheral pulses without edema.  Abdomen: Soft, + BS.  Non tender, no guarding, rebound, hernias, masses. Lymphatics: Non tender without lymphadenopathy.  Musculoskeletal: Full ROM, 5/5 strength, normal gait.  Skin: Warm, dry without rashes, lesions, ecchymosis.  Neuro: Cranial nerves intact. Normal  muscle tone, no cerebellar symptoms. Sensation intact.  Psych: Awake and oriented X 3, normal affect, Insight and Judgment appropriate.     Dan MakerAshley C Rayman Petrosian, NP 11:19 AM [facility]

## 2019-01-26 ENCOUNTER — Encounter: Payer: Self-pay | Admitting: Adult Health

## 2019-01-26 ENCOUNTER — Ambulatory Visit (INDEPENDENT_AMBULATORY_CARE_PROVIDER_SITE_OTHER): Payer: BLUE CROSS/BLUE SHIELD | Admitting: Adult Health

## 2019-01-26 VITALS — BP 120/64 | HR 80 | Temp 97.3°F | Ht 64.5 in | Wt 164.0 lb

## 2019-01-26 DIAGNOSIS — R7989 Other specified abnormal findings of blood chemistry: Secondary | ICD-10-CM

## 2019-01-26 DIAGNOSIS — E229 Hyperfunction of pituitary gland, unspecified: Secondary | ICD-10-CM

## 2019-01-26 DIAGNOSIS — E663 Overweight: Secondary | ICD-10-CM

## 2019-01-26 MED ORDER — TOPIRAMATE 25 MG PO TABS: ORAL_TABLET | ORAL | 1 refills | Status: DC

## 2019-01-26 NOTE — Patient Instructions (Addendum)
Try greek yogurt (with low sugar) - try the light and fit brand -   Try switching bread type - whole grain, high fiber - I like [employer] killer bread - thin sliced   Call me in 1 month to let me know how you feel topamax is doing -     Prolactin Level Test Why am I having this test? The prolactin level test is often used to diagnose and monitor problems with the pituitary gland, such as pituitary tumors. It may also be used to help find the cause of certain other conditions, such as an abnormal absence of menstrual cycles (amenorrhea) or a thyroid gland that does not produce enough hormones (hypothyroidism). Your health care provider may order this test if you have:  Irregular menstrual periods.  Loss of libido.  Milky fluid coming from your nipples (when not breastfeeding).  Fatigue. What is being tested? This test measures the amount of prolactin in your blood. Prolactin is a hormone that is produced by the pituitary gland. Prolactin levels normally go up and down (fluctuate) due to stress, illness, trauma, or surgery. Increased levels can also be caused by tumors or other health problems. What kind of sample is taken?  A blood sample is required for this test. It is usually collected by inserting a needle into a blood vessel. Tell a health care provider about:  All medicines you are taking, including vitamins, herbs, eye drops, creams, and over-the-counter medicines. How are the results reported? Your test results will be reported as values that indicate the amount of prolactin in your blood. Your health care provider will compare your results to normal ranges that were established after testing a large group of people (reference ranges). Reference ranges may vary among labs and hospitals. For this test, common reference ranges are:  Adult female: 3-13 ng/mL.  Adult female: 3-27 ng/mL.  Pregnant female: 20-400 ng/mL. What do the results mean? Increased levels of prolactin may mean  that you have:  A pituitary gland tumor.  Amenorrhea.  Hypothyroidism.  Certain pituitary or reproductive syndromes.  Kidney failure. Decreased levels of prolactin may indicate:  Lack of blood to the pituitary gland.  Pituitary gland failure. Talk with your health care provider about what your results mean. Questions to ask your health care provider Ask your health care provider, or the department that is doing the test:  When will my results be ready?  How will I get my results?  What are my treatment options?  What other tests do I need?  What are my next steps? Summary  The prolactin level test is often used to diagnose and monitor problems with the pituitary gland, such as pituitary tumors. It may also be used to help find the cause of certain other conditions, such as amenorrhea or hypothyroidism.  This test measures the amount of prolactin in your blood. Prolactin is a hormone that is produced by the pituitary gland.  Prolactin levels normally go up and down (fluctuate) due to stress, illness, trauma, or surgery. Increased levels can also be caused by tumors or other health problems.  Talk with your health care provider about what your results mean. This information is not intended to replace advice given to you by your health care provider. Make sure you discuss any questions you have with your health care provider. Document Released: 01/03/2005 Document Revised: 07/27/2017 Document Reviewed: 07/27/2017 Elsevier Interactive Patient Education  2019 Elsevier Inc.     Topiramate tablets What is this medicine? TOPIRAMATE (  toe PYRE a mate) is used to treat seizures in adults or children with epilepsy. It is also used for the prevention of migraine headaches. This medicine may be used for other purposes; ask your health care provider or pharmacist if you have questions. COMMON BRAND NAME(S): Topamax, Topiragen What should I tell my health care provider before I  take this medicine? They need to know if you have any of these conditions: -bleeding disorders -cirrhosis of the liver or liver disease -diarrhea -glaucoma -kidney stones or kidney disease -low blood counts, like low white cell, platelet, or red cell counts -lung disease like asthma, obstructive pulmonary disease, emphysema -metabolic acidosis -on a ketogenic diet -schedule for surgery or a procedure -suicidal thoughts, plans, or attempt; a previous suicide attempt by you or a family member -an unusual or allergic reaction to topiramate, other medicines, foods, dyes, or preservatives -pregnant or trying to get pregnant -breast-feeding How should I use this medicine? Take this medicine by mouth with a glass of water. Follow the directions on the prescription label. Do not crush or chew. You may take this medicine with meals. Take your medicine at regular intervals. Do not take it more often than directed. Talk to your pediatrician regarding the use of this medicine in children. Special care may be needed. While this drug may be prescribed for children as young as 732 years of age for selected conditions, precautions do apply. Overdosage: If you think you have taken too much of this medicine contact a poison control center or emergency room at once. NOTE: This medicine is only for you. Do not share this medicine with others. What if I miss a dose? If you miss a dose, take it as soon as you can. If your next dose is to be taken in less than 6 hours, then do not take the missed dose. Take the next dose at your regular time. Do not take double or extra doses. What may interact with this medicine? Do not take this medicine with any of the following medications: -probenecid This medicine may also interact with the following medications: -acetazolamide -alcohol -amitriptyline -aspirin and aspirin-like medicines -birth control pills -certain medicines for depression -certain medicines for  seizures -certain medicines that treat or prevent blood clots like warfarin, enoxaparin, dalteparin, apixaban, dabigatran, and rivaroxaban -digoxin -hydrochlorothiazide -lithium -medicines for pain, sleep, or muscle relaxation -metformin -methazolamide -NSAIDS, medicines for pain and inflammation, like ibuprofen or naproxen -pioglitazone -risperidone This list may not describe all possible interactions. Give your health care provider a list of all the medicines, herbs, non-prescription drugs, or dietary supplements you use. Also tell them if you smoke, drink alcohol, or use illegal drugs. Some items may interact with your medicine. What should I watch for while using this medicine? Visit your doctor or health care professional for regular checks on your progress. Do not stop taking this medicine suddenly. This increases the risk of seizures if you are using this medicine to control epilepsy. Wear a medical identification bracelet or chain to say you have epilepsy or seizures, and carry a card that lists all your medicines. This medicine can decrease sweating and increase your body temperature. Watch for signs of deceased sweating or fever, especially in children. Avoid extreme heat, hot baths, and saunas. Be careful about exercising, especially in hot weather. Contact your health care provider right away if you notice a fever or decrease in sweating. You should drink plenty of fluids while taking this medicine. If you have had kidney stones  in the past, this will help to reduce your chances of forming kidney stones. If you have stomach pain, with nausea or vomiting and yellowing of your eyes or skin, call your doctor immediately. You may get drowsy, dizzy, or have blurred vision. Do not drive, use machinery, or do anything that needs mental alertness until you know how this medicine affects you. To reduce dizziness, do not sit or stand up quickly, especially if you are an older patient. Alcohol can  increase drowsiness and dizziness. Avoid alcoholic drinks. If you notice blurred vision, eye pain, or other eye problems, seek medical attention at once for an eye exam. The use of this medicine may increase the chance of suicidal thoughts or actions. Pay special attention to how you are responding while on this medicine. Any worsening of mood, or thoughts of suicide or dying should be reported to your health care professional right away. This medicine may increase the chance of developing metabolic acidosis. If left untreated, this can cause kidney stones, bone disease, or slowed growth in children. Symptoms include breathing fast, fatigue, loss of appetite, irregular heartbeat, or loss of consciousness. Call your doctor immediately if you experience any of these side effects. Also, tell your doctor about any surgery you plan on having while taking this medicine since this may increase your risk for metabolic acidosis. Birth control pills may not work properly while you are taking this medicine. Talk to your doctor about using an extra method of birth control. Women who become pregnant while using this medicine may enroll in the [place] American Antiepileptic Drug Pregnancy Registry by calling [phone]. This registry collects information about the safety of antiepileptic drug use during pregnancy. What side effects may I notice from receiving this medicine? Side effects that you should report to your doctor or health care professional as soon as possible: -allergic reactions like skin rash, itching or hives, swelling of the face, lips, or tongue -decreased sweating and/or rise in body temperature -depression -difficulty breathing, fast or irregular breathing patterns -difficulty speaking -difficulty walking or controlling muscle movements -hearing impairment -redness, blistering, peeling or loosening of the skin, including inside the mouth -tingling, pain or numbness in the hands or  feet -unusual bleeding or bruising -unusually weak or tired -worsening of mood, thoughts or actions of suicide or dying Side effects that usually do not require medical attention (report to your doctor or health care professional if they continue or are bothersome): -altered taste -back pain, joint or muscle aches and pains -diarrhea, or constipation -headache -loss of appetite -nausea -stomach upset, indigestion -tremors This list may not describe all possible side effects. Call your doctor for medical advice about side effects. You may report side effects to FDA at 1-800-FDA-1088. Where should I keep my medicine? Keep out of the reach of children. Store at room temperature between 15 and 30 degrees C (59 and 86 degrees F) in a tightly closed container. Protect from moisture. Throw away any unused medicine after the expiration date. NOTE: This sheet is a summary. It may not cover all possible information. If you have questions about this medicine, talk to your doctor, pharmacist, or health care provider.  2019 Elsevier/Gold Standard (2013-12-05 23:17:57)

## 2019-01-31 DIAGNOSIS — F333 Major depressive disorder, recurrent, severe with psychotic symptoms: Secondary | ICD-10-CM | POA: Diagnosis not present

## 2019-02-01 ENCOUNTER — Other Ambulatory Visit: Payer: Self-pay | Admitting: Internal Medicine

## 2019-02-03 DIAGNOSIS — F411 Generalized anxiety disorder: Secondary | ICD-10-CM | POA: Diagnosis not present

## 2019-02-03 DIAGNOSIS — F3132 Bipolar disorder, current episode depressed, moderate: Secondary | ICD-10-CM | POA: Diagnosis not present

## 2019-02-03 DIAGNOSIS — F900 Attention-deficit hyperactivity disorder, predominantly inattentive type: Secondary | ICD-10-CM | POA: Diagnosis not present

## 2019-02-14 DIAGNOSIS — F333 Major depressive disorder, recurrent, severe with psychotic symptoms: Secondary | ICD-10-CM | POA: Diagnosis not present

## 2019-02-23 NOTE — Progress Notes (Signed)
Assessment and Plan:  Overweight Long discussion about weight loss, diet, and exercise Recommended diet heavy in fruits and veggies and low in animal meats, cheeses, and dairy products, appropriate calorie intake Patient will work on continuing walking, water intake,  Work on increasing fruit and vegetable intake; discussed aim for 1/2 cup x 7 servings daily  She would like to continue with topamax for 1 more month advised that med benefit may be limited if weight gain is SE of new psych med Discussed appropriate weight for height  Follow up at next visit -     topiramate (TOPAMAX) 25 MG tablet; Take 1 tab 1 hour prior to bedtime; if need higher dose can take second tab with dinner.  Further disposition pending results of labs. Discussed med's effects and SE's.   Over 15 minutes of exam, counseling, chart review, and critical decision making was performed.   Future Appointments  Date Time Provider Department Center  02/24/2019 10:45 AM Judd Gaudier, NP [facility] None  05/17/2019  9:30 AM Judd Gaudier, NP [facility] None  10/10/2019 10:00 AM Judd Gaudier, NP [facility] None    ------------------------------------------------------------------------------------------------------------------   HPI BP 110/70   Pulse 84   Temp 97.9 F (36.6 C)   Wt 169 lb (76.7 kg)   LMP  (LMP Unknown) Comment: "I've not had a period in 3-4 years"  SpO2 99%   BMI 28.56 kg/m   48 y.o.female with hx of Bipolar disorder and ADD presents for follow up on weight; she is concerned about ongoing weight gain since starting vraylar by psych, and has tried phentermine without notable benefit. She wanted to try another agent, and in light of reported recent headaches suggested low dose of trazodone after discussion of risks and benefits. We have discussed benefit with medications may be limited if weight gain is r/t psych meds, she has expressed understanding. She presents for 1 month follow up.   she  is newly prescribed trazodone for weight loss. She has been taking 2 tabs daily since last visit and hasn't noted any benefit. Reports weights at home are labile. While on the medication they have lost 0 lbs since last visit. They deny palpitations, anxiety, trouble sleeping, elevated BP. She has not tolerated wellbutrin in the past.   BMI is Body mass index is 28.56 kg/m., she is working on diet and exercise. Wt Readings from Last 3 Encounters:  02/24/19 169 lb (76.7 kg)  01/26/19 164 lb (74.4 kg)  12/17/18 159 lb (72.1 kg)   Typical breakfast: [place] whole grain toast with butter/jelly, old fashioned oat meal, etc Typical lunch: [employer] Typical dinner: [employer] or 2 veggies and a protein - grilled chicken or grilled pork loin  Exercise: walking 1 mile daily (~30 min) as weather allows Water intake: water 4-5 bottles daily, keeping soda out of house   Past Medical History:  Diagnosis Date  . ADD (attention deficit disorder)   . Bipolar 1 disorder, depressed (HCC)   . Cannabis abuse with psychotic disorder with delusions (HCC) 05/22/2018  . Cellulitis of left eyelid 01/2014  . Depression   . Dysmenorrhea   . Paranoid delusion (HCC) 05/22/2018     Allergies  Allergen Reactions  . Aspirin Other (See Comments)    Burns stomach    Current Outpatient Medications on File Prior to Visit  Medication Sig  . Cariprazine HCl (VRAYLAR) 6 MG CAPS Take by mouth.  . cyclobenzaprine (FLEXERIL) 5 MG tablet TAKE 1 TABLET(5 MG) BY MOUTH THREE  TIMES DAILY AS NEEDED FOR MUSCLE SPASMS  . esomeprazole (NEXIUM) 40 MG capsule Take 1 capsule Daily for Acid Indigestion & Reflux  . lamoTRIgine (LAMICTAL) 200 MG tablet Take 2 tablets (400 mg total) by mouth at bedtime.  . ondansetron (ZOFRAN) 4 MG tablet Take 4 mg by mouth as needed for nausea or vomiting.  . topiramate (TOPAMAX) 25 MG tablet Take 1 tab 1 hour prior to bedtime; if need higher dose can take second tab with dinner.  .  traZODone (DESYREL) 100 MG tablet Take 100 mg by mouth. Takes 200 mg at bedtime.   No current facility-administered medications on file prior to visit.     ROS: all negative except above.   Physical Exam:  BP 110/70   Pulse 84   Temp 97.9 F (36.6 C)   Wt 169 lb (76.7 kg)   LMP  (LMP Unknown) Comment: "I've not had a period in 3-4 years"  SpO2 99%   BMI 28.56 kg/m   General Appearance: Well nourished, in no apparent distress. Eyes: PERRLA, EOMs, conjunctiva no swelling or erythema Sinuses: No Frontal/maxillary tenderness ENT/Mouth: Ext aud canals clear, TMs without erythema, bulging. No erythema, swelling, or exudate on post pharynx.  Tonsils not swollen or erythematous. Hearing normal.  Neck: Supple, thyroid normal.  Respiratory: Respiratory effort normal, BS equal bilaterally without rales, rhonchi, wheezing or stridor.  Cardio: RRR with no MRGs. Brisk peripheral pulses without edema.  Abdomen: Soft, + BS.  Non tender, no guarding, rebound, hernias, masses. Lymphatics: Non tender without lymphadenopathy.  Musculoskeletal: Full ROM, 5/5 strength, normal gait.  Skin: Warm, dry without rashes, lesions, ecchymosis.  Neuro: Cranial nerves intact. Normal muscle tone, no cerebellar symptoms. Sensation intact.  Psych: Awake and oriented X 3, normal affect, Insight and Judgment appropriate.     Dan Maker, NP 10:18 AM [facility]

## 2019-02-24 ENCOUNTER — Other Ambulatory Visit: Payer: Self-pay

## 2019-02-24 ENCOUNTER — Ambulatory Visit (INDEPENDENT_AMBULATORY_CARE_PROVIDER_SITE_OTHER): Payer: BLUE CROSS/BLUE SHIELD | Admitting: Adult Health

## 2019-02-24 ENCOUNTER — Encounter: Payer: Self-pay | Admitting: Adult Health

## 2019-02-24 VITALS — BP 110/70 | HR 84 | Temp 97.9°F | Wt 169.0 lb

## 2019-02-24 DIAGNOSIS — F319 Bipolar disorder, unspecified: Secondary | ICD-10-CM | POA: Diagnosis not present

## 2019-02-24 DIAGNOSIS — E663 Overweight: Secondary | ICD-10-CM | POA: Diagnosis not present

## 2019-02-28 DIAGNOSIS — F333 Major depressive disorder, recurrent, severe with psychotic symptoms: Secondary | ICD-10-CM | POA: Diagnosis not present

## 2019-03-15 DIAGNOSIS — F333 Major depressive disorder, recurrent, severe with psychotic symptoms: Secondary | ICD-10-CM | POA: Diagnosis not present

## 2019-03-19 ENCOUNTER — Other Ambulatory Visit: Payer: Self-pay | Admitting: Adult Health

## 2019-03-29 DIAGNOSIS — F333 Major depressive disorder, recurrent, severe with psychotic symptoms: Secondary | ICD-10-CM | POA: Diagnosis not present

## 2019-04-11 DIAGNOSIS — F333 Major depressive disorder, recurrent, severe with psychotic symptoms: Secondary | ICD-10-CM | POA: Diagnosis not present

## 2019-04-25 DIAGNOSIS — F333 Major depressive disorder, recurrent, severe with psychotic symptoms: Secondary | ICD-10-CM | POA: Diagnosis not present

## 2019-05-10 DIAGNOSIS — F333 Major depressive disorder, recurrent, severe with psychotic symptoms: Secondary | ICD-10-CM | POA: Diagnosis not present

## 2019-05-13 NOTE — Progress Notes (Addendum)
Follow up  Assessment and Plan:  Bipolar 1 disorder, depressed (HCC) Continue follow up  Smoker unmotivated to quit Smoking cessation-  instruction/counseling given, counseled patient on the dangers of tobacco use, advised patient to stop smoking, and reviewed strategies to maximize success, patient not ready to quit at this time.   Overweight (BMI 25.0-29.9) -     Will do phentermine and topamax seperate Do food log If too expensive can do two different meds Suggest possible counseling  Over 30 minutes of exam, counseling, chart review, and complex, high level critical decision making was performed this visit.   Future Appointments  Date Time Provider Department Center  10/10/2019 10:00 AM Angelica Weeks [facility] None     HPI  48 y.o. female  presents for a complete physical and follow up for has ADD (attention deficit disorder); Bipolar 1 disorder, depressed (HCC); GERD (gastroesophageal reflux disease); Smoker unmotivated to quit; Overweight (BMI 25.0-29.9); and Elevated prolactin level (HCC) on their problem list.   She was also seen this year in ED for reports of 2 weeks of paranoid delusions and had medical workup which was reassuring. She  did go to [name] and was there for 5 days and did group therapy. She is now followed by Angelica Weeks, currently on Vraylar 6 mg and concerned with weight gain. She has avoided social media and has a [employer].   she currently continues to smoke 1 pack a day; 29 pack year smoking history; discussed risks associated with smoking, patient is not ready to quit.   BMI is Body mass index is 29.74 kg/m., she has not been working on diet and exercise. She could not tolerate wellbutrin, phentermine did not help, and has most recently been on topamax without much success.  Wt Readings from Last 3 Encounters:  05/17/19 176 lb (79.8 kg)  02/24/19 169 lb (76.7 kg)  01/26/19 164 lb (74.4 kg)   Today their BP is BP: 124/86 She does not  workout. She denies chest pain, shortness of breath, dizziness.   Patient is not currently on Vitamin D supplement.   Lab Results  Component Value Date   VD25OH 43 10/06/2018      Current Medications:  Current Outpatient Medications on File Prior to Visit  Medication Sig Dispense Refill  . Cariprazine HCl (VRAYLAR) 6 MG CAPS Take by mouth.    . cyclobenzaprine (FLEXERIL) 5 MG tablet TAKE 1 TABLET(5 MG) BY MOUTH THREE TIMES DAILY AS NEEDED FOR MUSCLE SPASMS 60 tablet 0  . esomeprazole (NEXIUM) 40 MG capsule Take 1 capsule Daily for Acid Indigestion & Reflux 90 capsule 1  . lamoTRIgine (LAMICTAL) 200 MG tablet Take 2 tablets (400 mg total) by mouth at bedtime.    . ondansetron (ZOFRAN) 4 MG tablet Take 4 mg by mouth as needed for nausea or vomiting.    . topiramate (TOPAMAX) 25 MG tablet TAKE 1 TABLET BY MOUTH 1 HOUR PRIOR TO BEDTIME. IF HIGHER DOSE CAN TAKE SECOND TABLET WITH DINNER. 60 tablet 1  . traZODone (DESYREL) 100 MG tablet Take 100 mg by mouth. Takes 200 mg at bedtime.     No current facility-administered medications on file prior to visit.    Allergies:  Allergies  Allergen Reactions  . Aspirin Other (See Comments)    Burns stomach   Medical History:  She has ADD (attention deficit disorder); Bipolar 1 disorder, depressed (HCC); GERD (gastroesophageal reflux disease); Smoker unmotivated to quit; Overweight (BMI 25.0-29.9); and Elevated prolactin level (HCC)  on their problem list.  Surgical History:  She has a past surgical history that includes Wisdom tooth extraction. Family History:  Herfamily history includes Diabetes in her mother; Heart disease in her mother. She was adopted. Social History:  She reports that she has been smoking cigarettes. She started smoking about 30 years ago. She has a 29.00 pack-year smoking history. She has never used smokeless tobacco. She reports current alcohol use of about 14.0 standard drinks of alcohol per week. She reports previous drug  use. Drug: Marijuana.  Review of Systems:  Review of Systems  Constitutional: Negative for malaise/fatigue and weight loss.  HENT: Negative for hearing loss and tinnitus.   Eyes: Negative for blurred vision and double vision.  Respiratory: Negative for cough, shortness of breath and wheezing.   Cardiovascular: Negative for chest pain, palpitations, orthopnea, claudication and leg swelling.  Gastrointestinal: Negative for abdominal pain, blood in stool, constipation, diarrhea, heartburn, melena, nausea and vomiting.  Genitourinary: Negative.   Musculoskeletal: Negative for falls, joint pain and myalgias.  Skin: Negative for rash.  Neurological: Negative for dizziness, tingling, sensory change, weakness and headaches.  Endo/Heme/Allergies: Negative for polydipsia.  Psychiatric/Behavioral: Positive for depression. Negative for hallucinations, memory loss, substance abuse and suicidal ideas. The patient is nervous/anxious. The patient does not have insomnia.   All other systems reviewed and are negative.   Physical Exam: Estimated body mass index is 29.74 kg/m as calculated from the following:   Height as of this encounter: 5' 4.5" (1.638 m).   Weight as of this encounter: 176 lb (79.8 kg). BP 124/86   Pulse 97   Temp (!) 97.5 F (36.4 C)   Ht 5' 4.5" (1.638 m)   Wt 176 lb (79.8 kg)   LMP  (LMP Unknown) Comment: "I've not had a period in 3-4 years"  SpO2 96%   BMI 29.74 kg/m  General Appearance: Well nourished, in no apparent distress.  Eyes: PERRLA, EOMs, conjunctiva no swelling or erythema, normal fundi and vessels.  Sinuses: No Frontal/maxillary tenderness  ENT/Mouth: Ext aud canals clear, normal light reflex with TMs without erythema, bulging. Good dentition. No erythema, swelling, or exudate on post pharynx. Tonsils not swollen or erythematous. Hearing normal.  Neck: Supple, thyroid normal. No bruits  Respiratory: Respiratory effort normal, BS equal bilaterally without rales,  rhonchi, wheezing or stridor.  Cardio: RRR without murmurs, rubs or gallops. Brisk peripheral pulses without edema.  Chest: symmetric, with normal excursions and percussion.  Abdomen: Soft, nontender, no guarding, rebound, hernias, masses, or organomegaly.  Lymphatics: Non tender without lymphadenopathy.  Musculoskeletal: Full ROM all peripheral extremities,5/5 strength, and normal gait.  Skin: Warm, dry without rashes, lesions, ecchymosis. Neuro: Cranial nerves intact, reflexes equal bilaterally. Normal muscle tone, no cerebellar symptoms. Sensation intact.  Psych: Awake and oriented X 3, normal affect, Insight and Judgment appropriate.    Angelica Weeks 9:31 AM [facility]

## 2019-05-17 ENCOUNTER — Telehealth: Payer: Self-pay | Admitting: Physician Assistant

## 2019-05-17 ENCOUNTER — Ambulatory Visit: Payer: BLUE CROSS/BLUE SHIELD | Admitting: Physician Assistant

## 2019-05-17 ENCOUNTER — Encounter: Payer: Self-pay | Admitting: Physician Assistant

## 2019-05-17 ENCOUNTER — Other Ambulatory Visit: Payer: Self-pay

## 2019-05-17 VITALS — BP 124/86 | HR 97 | Temp 97.5°F | Ht 64.5 in | Wt 176.0 lb

## 2019-05-17 DIAGNOSIS — E229 Hyperfunction of pituitary gland, unspecified: Secondary | ICD-10-CM

## 2019-05-17 DIAGNOSIS — F988 Other specified behavioral and emotional disorders with onset usually occurring in childhood and adolescence: Secondary | ICD-10-CM

## 2019-05-17 DIAGNOSIS — E663 Overweight: Secondary | ICD-10-CM | POA: Diagnosis not present

## 2019-05-17 DIAGNOSIS — F319 Bipolar disorder, unspecified: Secondary | ICD-10-CM | POA: Diagnosis not present

## 2019-05-17 DIAGNOSIS — R7989 Other specified abnormal findings of blood chemistry: Secondary | ICD-10-CM

## 2019-05-17 DIAGNOSIS — F411 Generalized anxiety disorder: Secondary | ICD-10-CM | POA: Diagnosis not present

## 2019-05-17 DIAGNOSIS — F900 Attention-deficit hyperactivity disorder, predominantly inattentive type: Secondary | ICD-10-CM | POA: Diagnosis not present

## 2019-05-17 DIAGNOSIS — F3132 Bipolar disorder, current episode depressed, moderate: Secondary | ICD-10-CM | POA: Diagnosis not present

## 2019-05-17 DIAGNOSIS — F17200 Nicotine dependence, unspecified, uncomplicated: Secondary | ICD-10-CM | POA: Diagnosis not present

## 2019-05-17 MED ORDER — NALTREXONE-BUPROPION HCL ER 8-90 MG PO TB12: ORAL_TABLET | ORAL | 3 refills | Status: DC

## 2019-05-17 MED ORDER — PHENTERMINE-TOPIRAMATE ER 3.75-23 MG PO CP24: ORAL_CAPSULE | ORAL | 0 refills | Status: DC

## 2019-05-17 MED ORDER — PHENTERMINE-TOPIRAMATE ER 7.5-46 MG PO CP24: ORAL_CAPSULE | ORAL | 2 refills | Status: DC

## 2019-05-17 NOTE — Telephone Encounter (Signed)
-----   Message from Gregery Na, CMA sent at 05/17/2019  3:06 PM EDT ----- Regarding: med questions Contact: [phone] Per office note:  Patient would like to speak with you about the new medication that you sent in for her.   FYI: I have already submitted the Prior. Auth.

## 2019-05-17 NOTE — Telephone Encounter (Signed)
Patient states she would prefer to try contrave, qsymia too expensive and has tried that combination. Will send in contrave, will get online coupon, told patient that the contrave may be too expensive as well and if that is the case we will try to split the medications.  She is also checking with her psychiatrist if she can be on the contrave with her other medications.

## 2019-05-17 NOTE — Patient Instructions (Addendum)
Check out  Mini habits for weight loss book  2 apps for tracking food is myfitness pal  loseit Noom  Check out 7 minute work out by [employer]  OR can take picture of your food   IF THE SCRIPT IS TOO EXPENSIVE THAN WE CAN SPLIT THE TWO AND HAVE YOU TAKE THAT LET us KNOW   Intermittent fasting is more about strategy than starvation. It's meant to reset your body in different ways, hopefully with fitness and nutrition changes as a result.  Like any big switchover, though, results may vary when it comes down to the individual level. What works for your friends may not work for you, or vice versa. That's why it's helpful to play around with variations on intermittent fasting and healthy habits and find what works best for you.  WHAT IS INTERMITTENT FASTING AND WHY DO IT?  Intermittent fasting doesn't involve specific foods, but rather, a strict schedule regarding when you eat. Also called "time-restricted eating," the tactic has been praised for its contribution to weight loss, improved body composition, and decreased cravings. Preliminary research also suggests it may be beneficial for glucose tolerance, hormone regulation, better muscle mass and lower body fat.  Part of its appeal is the simplicity of the effort. Unlike some other trends, there's no calculations to intermittent fasting.  You simply eat within a certain block of time, usually a window of 8-10 hours. In the other big block of time - about 14-16 hours, including when you're asleep - you don't eat anything, not even snacks. You can drink water, coffee, tea or any other beverage that doesn't have calories.  For example, if you like having a late dinner, you might skip breakfast and have your first meal at noon and your last meal of the day at 8 p.m., and then not eat until noon again the next day.  IDEAS FOR GETTING STARTED  If you're new to the strategy, it may be helpful to eat within the typical circadian rhythm  and keep eating within daylight hours. This can be especially beneficial if you're looking at intermittent fasting for weight-loss goals.  So first try only eating between 12pm to 8pm.  Outside of this time you may have water, black coffee, and hot tea. You may not eat it drink anything that has carbs, sugars, OR artificial sugars like diet soda.   Like any major eating and fitness shift, it can take time to find the perfect fit, so don't be afraid to experiment with different options - including ditching intermittent fasting altogether if it's simply not for you. But if it is, you may be surprised by some of the benefits that come along with the strategy.     When it comes to diets, agreement about the perfect plan isn't easy to find, even among the experts. Experts at the [facility] [employer] developed an idea known as the Healthy Eating Plate. Just imagine a plate divided into logical, healthy portions.  The emphasis is on diet quality:  Load up on vegetables and fruits - one-half of your plate: Aim for color and variety, and remember that potatoes don't count.  Go for whole grains - one-quarter of your plate: Whole wheat, barley, wheat berries, quinoa, oats, brown rice, and foods made with them. If you want pasta, go with whole wheat pasta.  Protein power - one-quarter of your plate: Fish, chicken, beans, and nuts are all healthy, versatile protein sources. Limit red meat.  The diet, however, does go beyond the plate, offering a few other suggestions.  Use healthy plant oils, such as olive, canola, soy, corn, sunflower and peanut. Check the labels, and avoid partially hydrogenated oil, which have unhealthy trans fats.  If you're thirsty, drink water. Coffee and tea are good in moderation, but skip sugary drinks and limit milk and dairy products to one or two daily servings.  The type of carbohydrate in the diet is more important than the amount. Some sources of  carbohydrates, such as vegetables, fruits, whole grains, and beans-are healthier than others.  Finally, stay active.

## 2019-05-17 NOTE — Telephone Encounter (Signed)
Noted  

## 2019-05-19 ENCOUNTER — Telehealth: Payer: Self-pay | Admitting: Physician Assistant

## 2019-05-19 MED ORDER — TOPIRAMATE 50 MG PO TABS: 50.0000 mg | ORAL_TABLET | Freq: Two times a day (BID) | ORAL | 2 refills | Status: DC

## 2019-05-19 MED ORDER — PHENTERMINE HCL 37.5 MG PO TABS: ORAL_TABLET | ORAL | 2 refills | Status: DC

## 2019-05-19 NOTE — Telephone Encounter (Signed)
-----   Message from Gregery Na, CMA sent at 05/18/2019 10:56 AM EDT ----- Regarding: med Contact: [phone] Per pharmacy note:   Plan does not cover this medication. ( CONTRAVE ER 8-90 mg ).  Another office      Patient would like to speak with provider about this issues.

## 2019-05-23 DIAGNOSIS — F333 Major depressive disorder, recurrent, severe with psychotic symptoms: Secondary | ICD-10-CM | POA: Diagnosis not present

## 2019-06-06 DIAGNOSIS — F333 Major depressive disorder, recurrent, severe with psychotic symptoms: Secondary | ICD-10-CM | POA: Diagnosis not present

## 2019-06-07 ENCOUNTER — Encounter: Payer: Self-pay | Admitting: Internal Medicine

## 2019-06-09 DIAGNOSIS — H524 Presbyopia: Secondary | ICD-10-CM | POA: Diagnosis not present

## 2019-06-09 DIAGNOSIS — H5212 Myopia, left eye: Secondary | ICD-10-CM | POA: Diagnosis not present

## 2019-06-12 ENCOUNTER — Other Ambulatory Visit: Payer: Self-pay | Admitting: Physician Assistant
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
## 2019-06-21 DIAGNOSIS — F333 Major depressive disorder, recurrent, severe with psychotic symptoms: Secondary | ICD-10-CM | POA: Diagnosis not present

## 2019-06-30 ENCOUNTER — Other Ambulatory Visit: Payer: Self-pay | Admitting: Physician Assistant

## 2019-07-04 DIAGNOSIS — F333 Major depressive disorder, recurrent, severe with psychotic symptoms: Secondary | ICD-10-CM | POA: Diagnosis not present

## 2019-07-18 DIAGNOSIS — F333 Major depressive disorder, recurrent, severe with psychotic symptoms: Secondary | ICD-10-CM | POA: Diagnosis not present

## 2019-07-19 DIAGNOSIS — F3132 Bipolar disorder, current episode depressed, moderate: Secondary | ICD-10-CM | POA: Diagnosis not present

## 2019-08-01 DIAGNOSIS — F333 Major depressive disorder, recurrent, severe with psychotic symptoms: Secondary | ICD-10-CM | POA: Diagnosis not present

## 2019-08-23 DIAGNOSIS — F333 Major depressive disorder, recurrent, severe with psychotic symptoms: Secondary | ICD-10-CM | POA: Diagnosis not present

## 2019-09-06 DIAGNOSIS — F333 Major depressive disorder, recurrent, severe with psychotic symptoms: Secondary | ICD-10-CM | POA: Diagnosis not present

## 2019-09-12 IMAGING — MR MR HEAD WO/W CM
14 of 19 series · 34 of 48 positions shown · IV contrast (8ml multihance)
Comparison: None.

CLINICAL DATA: Elevated prolactin.  Patient asymptomatic.

EXAM:
MRI HEAD WITHOUT AND WITH CONTRAST
TECHNIQUE: Multiplanar, multiecho pulse sequences of the brain and surrounding
structures were obtained without and with intravenous contrast.
CONTRAST:  8mL MULTIHANCE GADOBENATE DIMEGLUMINE 529 MG/ML IV SOLN

[Series 2: T1 · sagittal · 5.0mm · 0.45mm/px · 3 of 23 slices shown]
[im 1/23]
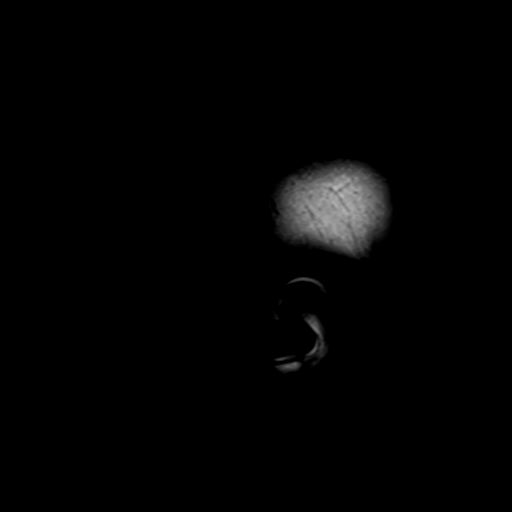
[im 12/23]
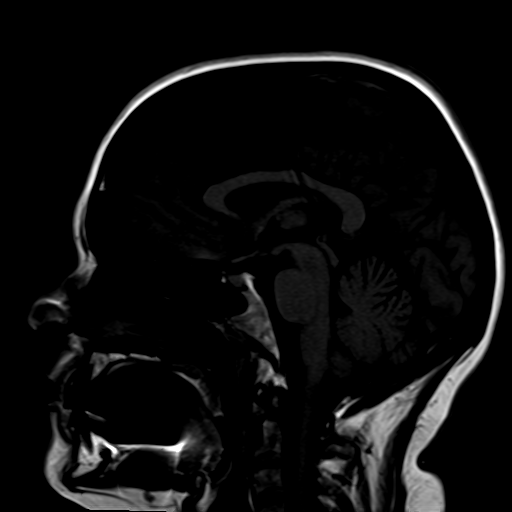
[im 23/23]
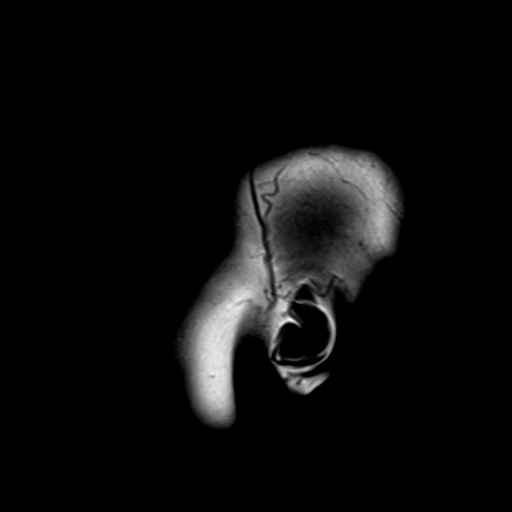

[Series 3: DWI · axial · 3.0mm · 1.80mm/px · z∈[-56,+90]mm · 11 of 98 slices shown]
[im 1/98]
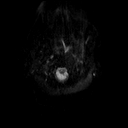
[im 10/98]
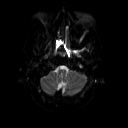
[im 20/98]
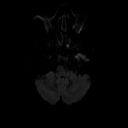
[im 30/98]
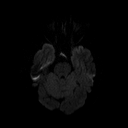
[im 39/98]
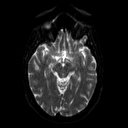
[im 49/98]
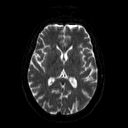
[im 59/98]
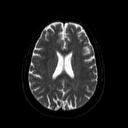
[im 68/98]
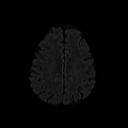
[im 78/98]
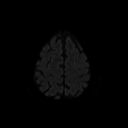
[im 88/98]
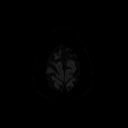
[im 98/98]
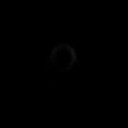

[Series 4: dwi_adc · axial · 3.0mm · 1.80mm/px · z∈[-56,+90]mm · 5 of 50 slices shown]
[im 1/50]
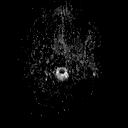
[im 13/50]
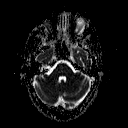
[im 25/50]
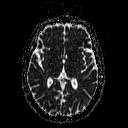
[im 37/50]
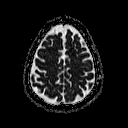
[im 50/50]
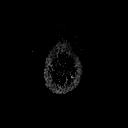

[Series 5: T2 · axial · 5.0mm · 0.36mm/px · z∈[-58,+91]mm · 2 of 24 slices shown]
[im 1/24]
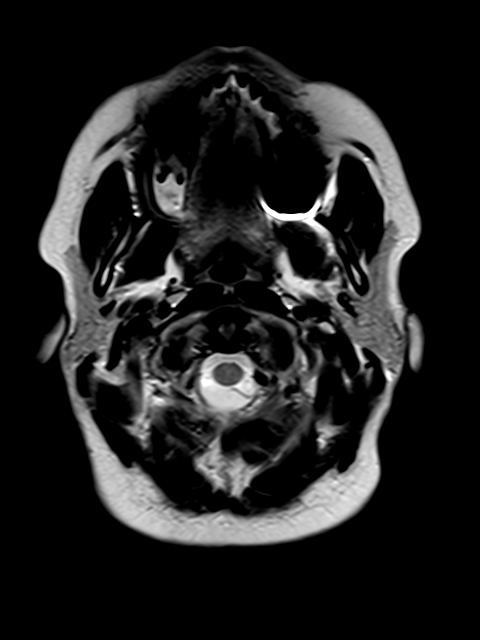
[im 24/24]
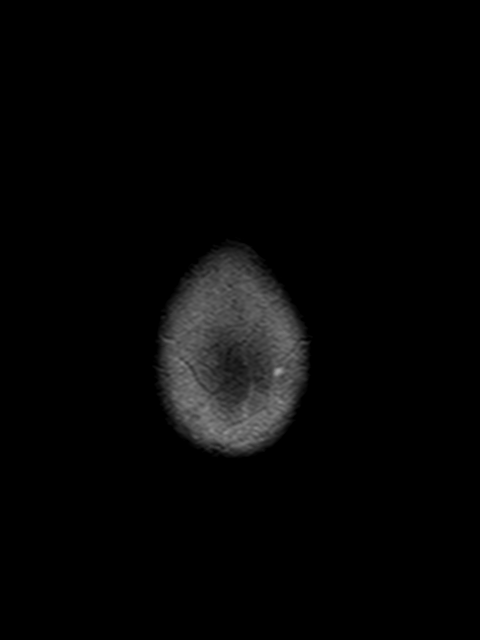

[Series 6: FLAIR · axial · 3.0mm · 0.45mm/px · z∈[-57,+91]mm · 3 of 33 slices shown]
[im 1/33]
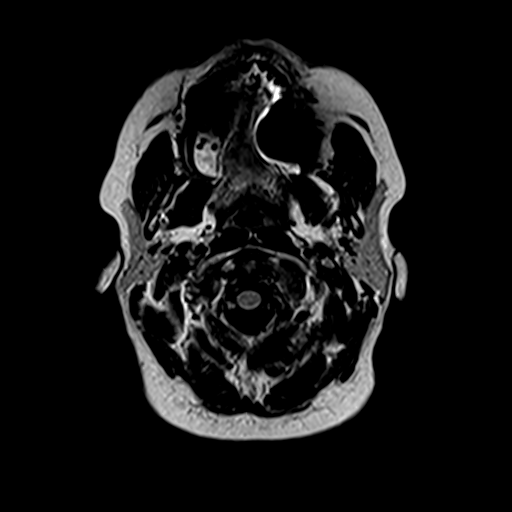
[im 17/33]
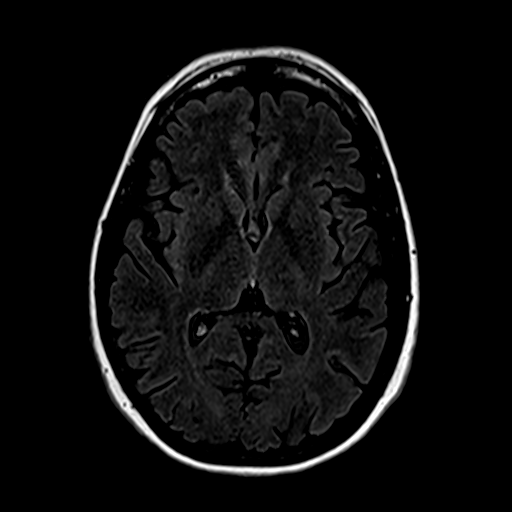
[im 33/33]
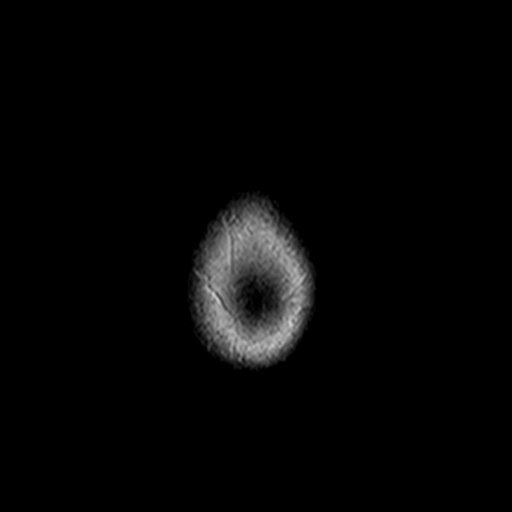

[Series 7: sag 3mm · sagittal · 3.0mm · 0.33mm/px · 1 of 14 slices shown]
[im 1/14]
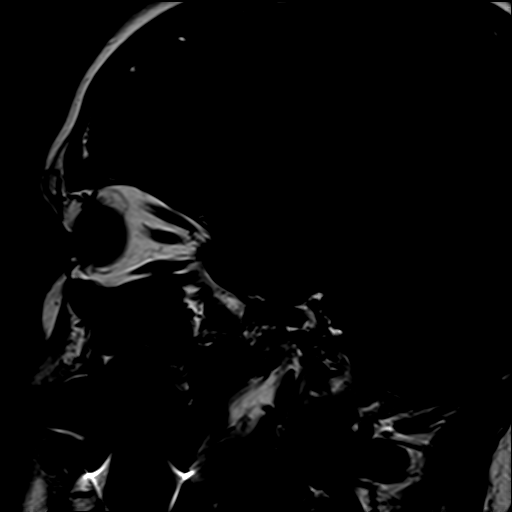

[Series 8: axial grad (blood) · axial · 5.0mm · 0.45mm/px · z∈[-58,+91]mm · 2 of 24 slices shown]
[im 1/24]
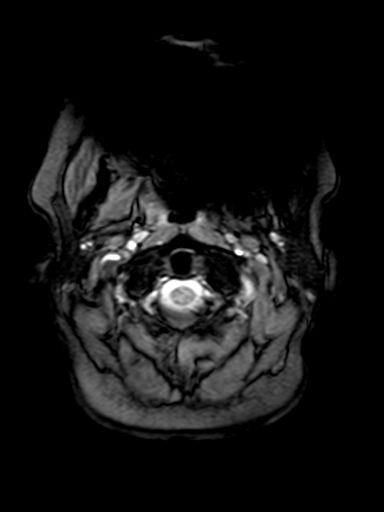
[im 24/24]
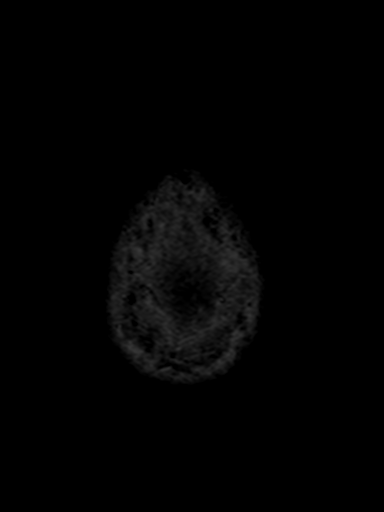

[Series 9: cor 3mm · coronal · 3.0mm · 0.33mm/px · 1 of 12 slices shown]
[im 1/12]
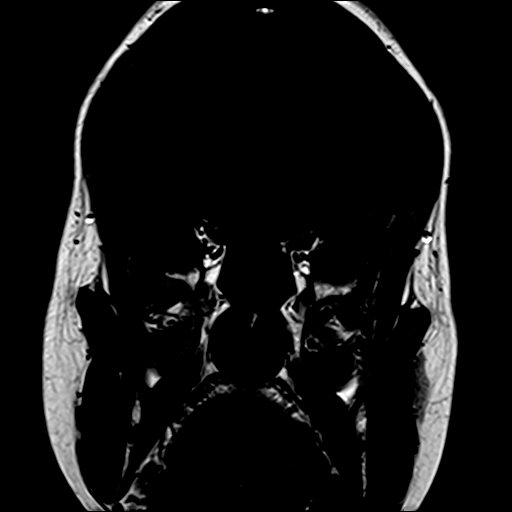

[Series 10: pre cor dynamic · coronal · non-contrast · 3.0mm · 0.35mm/px · 1 of 11 slices shown]
[im 1/11]
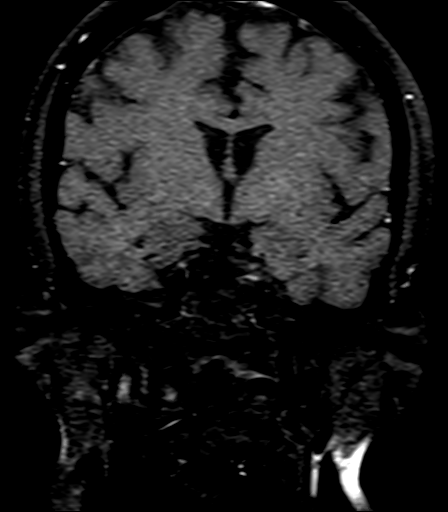

[Series 11: post fs cor · coronal · 3.0mm · 0.35mm/px · 1 of 11 slices shown (1 of 5)]
[im 1/11]
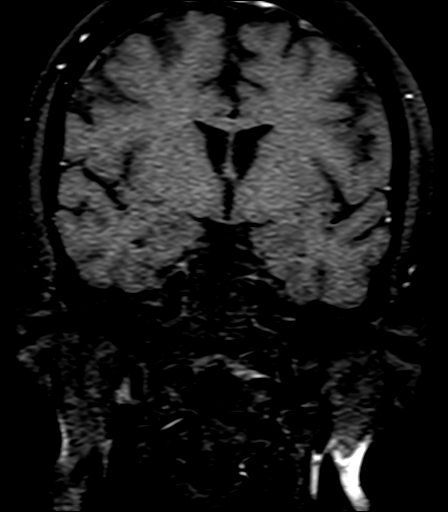

[Series 12: post fs cor · coronal · 3.0mm · 0.35mm/px · 1 of 11 slices shown (2 of 5)]
[im 1/11]
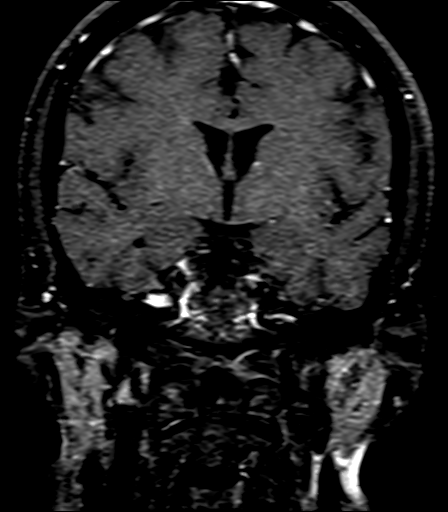

[Series 13: post fs cor · coronal · 3.0mm · 0.35mm/px · 1 of 11 slices shown (3 of 5)]
[im 1/11]
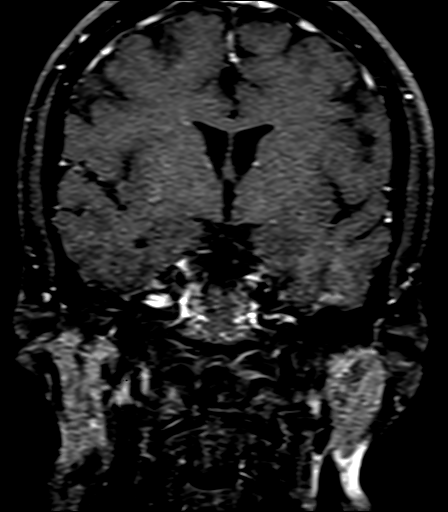

[Series 14: post fs cor · coronal · 3.0mm · 0.35mm/px · 1 of 11 slices shown (4 of 5)]
[im 1/11]
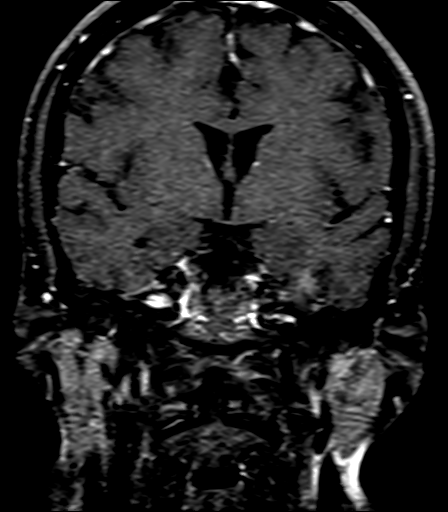

[Series 15: post fs cor · coronal · 3.0mm · 0.35mm/px · 1 of 11 slices shown (5 of 5)]
[im 1/11]
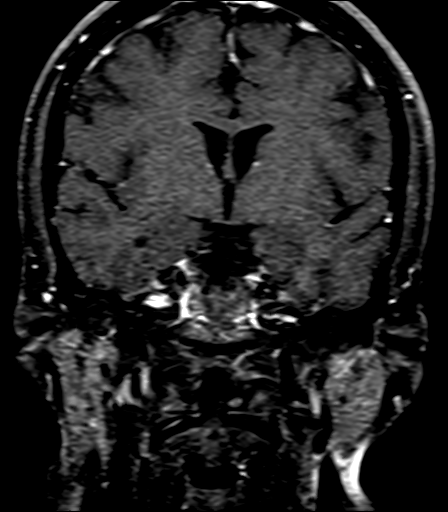

[34 of 48 positions shown; findings below may reference images not displayed]

FINDINGS: Brain: No evidence for acute infarction, hemorrhage, mass lesion,
hydrocephalus, or extra-axial fluid. Normal cerebral volume. No
white matter disease.

Post infusion, no abnormal enhancement of the brain or meninges.

Thin-section imaging was performed through the sella turcica before,
during, and after contrast administration. Normal gland height and
contour. Stalk midline. No areas of differential enhancement. Static
imaging demonstrates no areas of asymmetric decreased or increased
signal to suggest microadenoma. Normal cavernous sinuses. Normal
suprasellar cistern. No evidence of sellar enlargement or erosion.

Vascular: Normal flow voids.

Skull and upper cervical spine: Normal marrow signal.

Sinuses/Orbits: Negative.

Other: None.
IMPRESSION: Negative exam.  No acute or focal intracranial abnormality.

Study was tailored for examination of the pituitary, and no
abnormality was seen. Specifically no evidence for prolactinoma,
cavernous sinus lesion, or parasellar/suprasellar mass.

## 2019-09-20 DIAGNOSIS — F333 Major depressive disorder, recurrent, severe with psychotic symptoms: Secondary | ICD-10-CM | POA: Diagnosis not present

## 2019-09-29 DIAGNOSIS — F3132 Bipolar disorder, current episode depressed, moderate: Secondary | ICD-10-CM | POA: Diagnosis not present

## 2019-10-04 DIAGNOSIS — F333 Major depressive disorder, recurrent, severe with psychotic symptoms: Secondary | ICD-10-CM | POA: Diagnosis not present

## 2019-10-05 ENCOUNTER — Other Ambulatory Visit: Payer: Self-pay | Admitting: Adult Health

## 2019-10-05 ENCOUNTER — Other Ambulatory Visit: Payer: Self-pay | Admitting: Physician Assistant

## 2019-10-05 ENCOUNTER — Other Ambulatory Visit: Payer: Self-pay | Admitting: Internal Medicine

## 2019-10-08 NOTE — Progress Notes (Signed)
Complete Physical  Assessment and Plan:  Morrie Sheldonshley was seen today for annual exam.  Diagnoses and all orders for this visit:  Encounter for annual physical exam yearly -     CBC with Differential/Platelet -     COMPLETE METABOLIC PANEL WITH GFR -     Lipid panel -     TSH -     Hemoglobin A1c -     Insulin, random -     VITAMIN D 25 Hydroxy (Vit-D Deficiency, Fractures) -     Magnesium -     Urinalysis w microscopic + reflex cultur -     Vitamin B12  Gastroesophageal reflux disease, unspecified whether esophagitis present Doing well at this time Taking Nexium 40mg  -     TSH -     Magnesium  Vitamin D deficiency Continue supplementation -     VITAMIN D 25 Hydroxy (Vit-D Deficiency)  Bipolar 1 disorder, depressed (HCC) Recurrent major depressive disorder, in partial remission (HCC)  Follows with Dr Deatra RobinsonKaren Jones Taking Vraylar 6mg  daily & Lamitctal 200mg  two tablets (400mg ) daily. Discussed reaching out to discussed increased anxiety Discussed distraction: increase walking, walking dog, exercise -     CBC with Differential/Platelet -     COMPLETE METABOLIC PANEL WITH GFR   Attention deficit disorder (ADD) without hyperactivity Doing well at this time No medications  Smoker unmotivated to quit One pack a day Discussed smoking cessation Not ready to quit at this time  History of cannabis dependence/abuse (HCC) Denies any current use Continue to monitor  Elevated prolactin level Hirsutism -     ACTH -     Prolactin  BMI 31.0-31.9,adult -     Hemoglobin A1c -     Insulin, random  Screening cholesterol level -     Lipid panel  Screening for thyroid disorder -     TSH  Screening for diabetes mellitus -     Hemoglobin A1c -     Insulin, random  Screening for blood or protein in urine -     Urinalysis w microscopic + reflex cultur  Encounter for vitamin deficiency screening -     Vitamin B12  Screening for cardiovascular condition -     EKG  12-Lead    We will contact you in 1-3 business days with lab results drawn today.  Follow up in 4 week(s) for weight management, food journal review.   Call or return with new or worsening symptoms as discussed in appointment.  May contact office via phone [phone] or MyChart.   Discussed med's effects and SE's. Screening labs and tests as requested with regular follow-up as recommended. Over 40 minutes of interview, exam, counseling, chart review, and complex, high level critical decision making was performed this visit.   HPI  48 y.o. female  presents for a complete physical and follow up for has ADD (attention deficit disorder); Bipolar 1 disorder, depressed (HCC); GERD (gastroesophageal reflux disease); Smoker unmotivated to quit; Overweight (BMI 25.0-29.9); and Elevated prolactin level on their problem list..  She reports that over the past year she has gained about 40lbs.  She is concerned as she is is eating healthy and exercising regularly.  She has also been taking phemertime and has added topamax at one point.  She is not taking topamx now as it was not helping.  She also reports that her hair has been thinning and her hair dresser mentioned "she is loosing significant amount of hair".  She denies any bald spots,  itchy scalp, brittle nails or dry skin.  Report she is having some SOB with sitting.  She is a current smoker and smokes a pack a day.  She reports it will happen suddenly and she has to take deep breaths to help.  She does report and increase in stress and anxiety.  She manages her bipolar disorder with Vraylar and lamictal. She is seeing Deatra Robinson, MD and she is managing these medications.  Reports doing well with the medicaitons.  She has had a phone visit two weeks ago.  She reports she has had an increase in stress.  She exercises or takes the dog for a walk.  She is requesting something to help with her anxiety.  Reports she used to take alprazolam once a day to  help.  She reports that the psychiatrist decreased this to 10 a month then stopped all together.  She reports she has let the psychiatrist know of her increasing anxiety.  She reports that hydroxyzine does not work for her.  Her blood pressure has been controlled at home, today their BP is BP: 116/68 She does workout. She denies chest pain, shortness of breath, dizziness.   She is not on cholesterol medication and denies myalgias. Her cholesterol is at goal. The cholesterol last visit was:   Lab Results  Component Value Date   CHOL 190 10/06/2018   HDL 85 10/06/2018   LDLCALC 87 10/06/2018   TRIG 88 10/06/2018   CHOLHDL 2.2 10/06/2018    She has been working on diet and exercise for prediabetes, she is not on bASA, she is not on ACE/ARB and denies increased appetite, nausea, paresthesia of the feet, polydipsia, polyuria, visual disturbances, vomiting and weight loss. Last A1C in the office was:  Lab Results  Component Value Date   HGBA1C 4.8 10/06/2018    Last GFR: Lab Results  Component Value Date   GFRNONAA 85 10/06/2018   Lab Results  Component Value Date   GFRAA 99 10/06/2018    Patient is on Vitamin D supplement.   Lab Results  Component Value Date   VD25OH 43 10/06/2018      Current Medications:  Current Outpatient Medications on File Prior to Visit  Medication Sig Dispense Refill  . cyclobenzaprine (FLEXERIL) 10 MG tablet Take 1/2 to 1 tablet 2 to 3 x /day if needed for Muscle Spasms 90 tablet 0  . esomeprazole (NEXIUM) 40 MG capsule Take 1 capsule Daily for Acid Indigestion & Reflux 90 capsule 1  . lamoTRIgine (LAMICTAL) 200 MG tablet Take 2 tablets (400 mg total) by mouth at bedtime.    . ondansetron (ZOFRAN) 4 MG tablet Take 1 tablet 3 x /day if needed for Nausea 30 tablet 0  . phentermine (ADIPEX-P) 37.5 MG tablet Take 1/2 to 1 tablet every Morning for Dieting & Weight Loss 90 tablet 1  . traZODone (DESYREL) 100 MG tablet Take 100 mg by mouth. Takes 200 mg at  bedtime.    Marland Kitchen VRAYLAR 6 MG CAPS TAKE 1 CAPSULE BY MOUTH DAILY 30 capsule 3  . topiramate (TOPAMAX) 50 MG tablet Take 1 tablet (50 mg total) by mouth 2 (two) times daily. (Patient not taking: Reported on 10/10/2019) 60 tablet 2   No current facility-administered medications on file prior to visit.    Allergies:  Allergies  Allergen Reactions  . Aspirin Other (See Comments)    Burns stomach   Medical History:  She has ADD (attention deficit disorder); Bipolar 1 disorder, depressed (HCC);  GERD (gastroesophageal reflux disease); Smoker unmotivated to quit; Overweight (BMI 25.0-29.9); and Elevated prolactin level on their problem list. Health Maintenance:   Immunization History  Administered Date(s) Administered  . PPD Test 02/13/2015, 04/13/2017  . Pneumococcal Polysaccharide-23 10/06/2018  . Tdap 02/13/2015    Vaccinations: TD or Tdap: 02/2015  Influenza: Declines  Pneumococcal: 2019-smoker Prevnar13: N/A Shingles: Zostavax/Shingrix: N/A   Preventative Care: Last colonoscopy: N/A Last mammogram: : DUE Last pap smear/pelvic exam: 12/2018 - postmenopausal, Ammenorrhea 3-4years. DEXA:N/A Hep C screening ([id number]): N/A    Names of Other Physician/Practitioners you currently use: 1. [facility] here for primary care 2. Eye Exam: DUE 3. Dental Exam DUE   Patient Care Team: Unk Pinto, MD as PCP - General (Internal Medicine) Warden Fillers, MD as Consulting Physician (Optometry) Linda Hedges, DO as Consulting Physician (Obstetrics and Gynecology) Sheralyn Boatman, MD as Consulting Physician (Psychiatry)  Surgical History:  She has a past surgical history that includes Wisdom tooth extraction. Family History:  Herfamily history includes Diabetes in her mother; Heart disease in her mother. She was adopted. Social History:  She reports that she has been smoking cigarettes. She started smoking about 30 years ago. She has a  29.00 pack-year smoking history. She has never used smokeless tobacco. She reports current alcohol use of about 14.0 standard drinks of alcohol per week. She reports previous drug use. Drug: Marijuana.  Review of Systems: ROS  Physical Exam: Estimated body mass index is 31.49 kg/m as calculated from the following:   Height as of this encounter: 5' 5.75" (1.67 m).   Weight as of this encounter: 193 lb 9.6 oz (87.8 kg). BP 116/68   Pulse 84   Temp (!) 97.5 F (36.4 C)   Ht 5' 5.75" (1.67 m)   Wt 193 lb 9.6 oz (87.8 kg)   LMP  (LMP Unknown) Comment: "I've not had a period in 3-4 years"  SpO2 99%   BMI 31.49 kg/m  General Appearance: Well nourished, in no apparent distress.  Eyes: PERRLA, EOMs, conjunctiva no swelling or erythema, normal fundi and vessels.  Sinuses: No Frontal/maxillary tenderness  ENT/Mouth: Ext aud canals clear, normal light reflex with TMs without erythema, bulging. Good dentition. No erythema, swelling, or exudate on post pharynx. Tonsils not swollen or erythematous. Hearing normal.  Neck: Supple, thyroid normal. No bruits  Respiratory: Respiratory effort normal, BS equal bilaterally without rales, rhonchi, wheezing or stridor.  Cardio: RRR without murmurs, rubs or gallops. Brisk peripheral pulses without edema.  Chest: symmetric, with normal excursions and percussion.  Breasts: Symmetric, without lumps, nipple discharge, retractions.  Abdomen: Soft, nontender, no guarding, rebound, hernias, masses, or organomegaly.  Lymphatics: Non tender without lymphadenopathy.  Genitourinary:  Musculoskeletal: Full ROM all peripheral extremities,5/5 strength, and normal gait.  Skin: Warm, dry without rashes, lesions, ecchymosis. Neuro: Cranial nerves intact, reflexes equal bilaterally. Normal muscle tone, no cerebellar symptoms. Sensation intact.  Psych: Awake and oriented X 3, normal affect, Insight and Judgment appropriate.   EKG: WNL no ST changes.   Garnet Sierras 4:23 PM [facility]

## 2019-10-10 ENCOUNTER — Ambulatory Visit (INDEPENDENT_AMBULATORY_CARE_PROVIDER_SITE_OTHER): Payer: BC Managed Care – PPO | Admitting: Adult Health Nurse Practitioner

## 2019-10-10 ENCOUNTER — Other Ambulatory Visit: Payer: Self-pay

## 2019-10-10 ENCOUNTER — Encounter: Payer: Self-pay | Admitting: Adult Health Nurse Practitioner

## 2019-10-10 VITALS — BP 116/68 | HR 84 | Temp 97.5°F | Ht 65.75 in | Wt 193.6 lb

## 2019-10-10 DIAGNOSIS — L680 Hirsutism: Secondary | ICD-10-CM | POA: Diagnosis not present

## 2019-10-10 DIAGNOSIS — Z1389 Encounter for screening for other disorder: Secondary | ICD-10-CM | POA: Diagnosis not present

## 2019-10-10 DIAGNOSIS — Z0000 Encounter for general adult medical examination without abnormal findings: Secondary | ICD-10-CM | POA: Diagnosis not present

## 2019-10-10 DIAGNOSIS — Z1321 Encounter for screening for nutritional disorder: Secondary | ICD-10-CM

## 2019-10-10 DIAGNOSIS — Z136 Encounter for screening for cardiovascular disorders: Secondary | ICD-10-CM

## 2019-10-10 DIAGNOSIS — Z130 Encounter for screening for diseases of the blood and blood-forming organs and certain disorders involving the immune mechanism: Secondary | ICD-10-CM

## 2019-10-10 DIAGNOSIS — Z1329 Encounter for screening for other suspected endocrine disorder: Secondary | ICD-10-CM | POA: Diagnosis not present

## 2019-10-10 DIAGNOSIS — F3341 Major depressive disorder, recurrent, in partial remission: Secondary | ICD-10-CM

## 2019-10-10 DIAGNOSIS — Z13220 Encounter for screening for lipoid disorders: Secondary | ICD-10-CM | POA: Diagnosis not present

## 2019-10-10 DIAGNOSIS — Z131 Encounter for screening for diabetes mellitus: Secondary | ICD-10-CM | POA: Diagnosis not present

## 2019-10-10 DIAGNOSIS — R7989 Other specified abnormal findings of blood chemistry: Secondary | ICD-10-CM | POA: Diagnosis not present

## 2019-10-10 DIAGNOSIS — E559 Vitamin D deficiency, unspecified: Secondary | ICD-10-CM | POA: Diagnosis not present

## 2019-10-10 DIAGNOSIS — Z6831 Body mass index (BMI) 31.0-31.9, adult: Secondary | ICD-10-CM

## 2019-10-10 DIAGNOSIS — Z79899 Other long term (current) drug therapy: Secondary | ICD-10-CM | POA: Diagnosis not present

## 2019-10-10 DIAGNOSIS — F17200 Nicotine dependence, unspecified, uncomplicated: Secondary | ICD-10-CM

## 2019-10-10 DIAGNOSIS — K219 Gastro-esophageal reflux disease without esophagitis: Secondary | ICD-10-CM

## 2019-10-10 DIAGNOSIS — F1221 Cannabis dependence, in remission: Secondary | ICD-10-CM

## 2019-10-10 DIAGNOSIS — F319 Bipolar disorder, unspecified: Secondary | ICD-10-CM

## 2019-10-10 DIAGNOSIS — F988 Other specified behavioral and emotional disorders with onset usually occurring in childhood and adolescence: Secondary | ICD-10-CM

## 2019-10-10 NOTE — Patient Instructions (Addendum)
You are due for a Mammogram, screening, call [place] for this. You are also due for an eye and exam.  We will contact you via telephone with our lab results in 1-3 days.  We are going to check you Thyroid as well as the prolactin level that has been elevated in the past.  Start keeping a food diary. You can also keep a food diary, write down everything you eat and drink each day.  This is extremely helpful as we can then make small changes to the food you do eat.  I find patients get a more personalized plan this way. We will follow up in month month for weight check and review of your food diary.  Continue taking the phentermine 37.'5mg'$  first thing in the morning, 56mn before your first meal.  Take this with with a full glass of water.  Avoid all sugary drinks, juice soda, etc.  Be sure you are drinking 60-80oz of water a day.  8 Critical Weight-Loss Tips That Aren't Diet and Exercise  1. STARVE THE DISTRACTIONS  All too often when we eat, we're also multitasking: watching TV, answering emails, scrolling through social media. These habits are detrimental to having a strong, clear, healthy relationship with food, and they can hinder our ability to make dietary changes.  In order to truly focus on what you're eating, how much you're eating, why you're eating those specific foods and, most importantly, how those foods make you feel, you need to starve the distractions. That means when you eat, just eat. Focus on your food, the process it went through to end up on your plate, where it came from and how it nourishes you. With this technique, you're more likely to finish a meal feeling satiated.  2.  CONSIDER WHAT YOU'RE NOT WILLING TO DO  This might sound counterintuitive, but it can help provide a "why" when motivation is waning. Declare, in writing, what you are unwilling to do, for example "I am unwilling to be the old dad who cannot play sports with my children".  So  consider what you're not willing to accept, write it down, and keep it at the ready.  3.  STOP LABELING FOOD "GOOD" AND "BAD"  You've probably heard someone say they ate something "bad." Maybe you've even said it yourself.  The trouble with 'bad' foods isn't that they'll send you to the grave after a bite or two. The trouble comes when we eat excessive portions of really calorie-dense foods meal after meal, day after day.  Instead of labeling foods as good or bad, think about which foods you can eat a lot of, and which ones you should just eat a little of. Then, plan ways to eat the foods you really like in portions that fit with your overall goals. A good example of this would be having a slice of pizza alongside a club salad with chicken breast, avocado and a bit of dressing. This is vastly different than 3 slices of pizza, 4 breadsticks with cheese sauce and half of a liter of regular soda.  4.  BRUSH YOUR TEETH AFTER YOU EAT  Getting your mindset in order is important, but sometimes small habits can make a big difference. After eating, you still have the taste of food in their mouth, which often causes people to eat more even if they are full or engage in a nibble or two of dessert.  Brushing your teeth will remove the taste of food  from your mouth, and the clean, minty freshness will serve as a cue that mealtime is over.  5.  FOCUS ON CROWDING NOT CUTTING  The most common first step during 'dieting' is to cut. We cut our portion sizes down, we cut out 'bad' foods, we cut out entire food groups. This act of cutting puts [place] and our minds into scarcity mode.  When something is off-limits, even if you're able to avoid it for a while, you could end up bingeing on it later because you've gone so long without it. So, instead of cutting, focus on crowding. If you crowd your plate and fill it up with more foods like veggies and protein, it simply allows less room for the other stuff. In other words,  shift your focus away from what you can't eat, and celebrate the foods that will help you reach your goals.  6.  TAKE TRACKING A STEP FURTHER  Track what you eat, when you ate it, how much you ate and how that food made you feel. Being completely honest with yourself and writing down every single thing that passes through your lips will help you start to notice that maybe you actually do snack, possibly take in more sugar than you thought, eat when you're bored rather than just hungry or maybe that you have a habit of snacking before bed while watching TV.  The difference from simply tracking your food intake is you're taking into account how food makes you feel, as well as what you're doing while you're eating. This is about becoming more mindful of what, when and why you eat.  7.  PRIORITIZE GOOD SLEEP  One of the strongest risk factors for being overweight is poor sleep. When you're feeling tired, you're more likely to choose unhealthy comfort foods and to skip your workout. Additionally, sleep deprivation may slow down your metabolism. [employer]! Therefore, sleeping 7-8 hours per night can help with weight loss without having to change your diet or increase your physical activity. And if you feel you snore and still wake up tired, talk with me about sleep apnea.  8.  SET ASIDE TIME TO DISCONNECT  Just get out there. Disconnect from the electronics and connect to the elements. Not only will this help reduce stress (a major factor in weight gain) by giving your mind a break from the constant stimulation we've all become so accustomed to, but it may also reprogram your brain to connect with yourself and what you're feeling.  Today you had a telephone visit with Garnet Sierras, DNP.  Below is a summary of your visit  Ways to cut 100 calories  1. Eat your eggs with hot sauce OR salsa instead of cheese.  Eggs are great for breakfast, but many people consider eggs and cheese to be BFFs. Instead of  cheese-1 oz. of cheddar has 114 calories-top your eggs with hot sauce, which contains no calories and helps with satiety and metabolism. Salsa is also a great option!!  2. Top your toast, waffles or pancakes with mashed berries instead of jelly or syrup. Half a cup of berries-fresh, frozen or thawed-has about 40 calories, compared with 2 tbsp. of maple syrup or jelly, which both have about 100 calories. The berries will also give you a good punch of fiber, which helps keep you full and satisfied and won't spike blood sugar quickly like the jelly or syrup. 3. Swap the non-fat latte for black coffee with a splash of half-and-half. Contrary to its  name, that non-fat latte has 130 calories and a startling 19g of carbohydrates per 16 oz. serving. Replacing that 'light' drinkable dessert with a black coffee with a splash of half-and-half saves you more than 100 calories per 16 oz. serving. 4. Sprinkle salads with freeze-dried raspberries instead of dried cranberries. If you want a sweet addition to your nutritious salad, stay away from dried cranberries. They have a whopping 130 calories per  cup and 30g carbohydrates. Instead, sprinkle freeze-dried raspberries guilt-free and save more than 100 calories per  cup serving, adding 3g of belly-filling fiber. 5. Go for mustard in place of mayo on your sandwich. Mustard can add really nice flavor to any sandwich, and there are tons of varieties, from spicy to honey. A serving of mayo is 95 calories, versus 10 calories in a serving of mustard. 6. Choose a DIY salad dressing instead of the store-bought kind. Mix Dijon or whole grain mustard with low-fat Kefir or red wine vinegar and garlic. 7. Use hummus as a spread instead of a dip. Use hummus as a spread on a high-fiber cracker or tortilla with a sandwich and save on calories without sacrificing taste. 8. Pick just one salad "accessory." Salad isn't automatically a calorie winner. It's easy to over-accessorize  with toppings. Instead of topping your salad with nuts, avocado and cranberries (all three will clock in at 313 calories), just pick one. The next day, choose a different accessory, which will also keep your salad interesting. You don't wear all your jewelry every day, right? 9. Ditch the white pasta in favor of spaghetti squash. One cup of cooked spaghetti squash has about 40 calories, compared with traditional spaghetti, which comes with more than 200. Spaghetti squash is also nutrient-dense. It's a good source of fiber and Vitamins A and C, and it can be eaten just like you would eat pasta-with a great tomato sauce and [place] meatballs or with pesto, tofu and spinach, for example. 10. Dress up your chili, soups and stews with non-fat [place] yogurt instead of sour cream. Just a 'dollop' of sour cream can set you back 115 calories and a whopping 12g of fat-seven of which are of the artery-clogging variety. Added bonus: [place] yogurt is packed with muscle-building protein, calcium and B Vitamins. 11. Mash cauliflower instead of mashed potatoes. One cup of traditional mashed potatoes-in all their creamy goodness-has more than 200 calories, compared to mashed cauliflower, which you can typically eat for less than 100 calories per 1 cup serving. Cauliflower is a great source of the antioxidant indole-3-carbinol (I3C), which may help reduce the risk of some cancers, like breast cancer. 12. Ditch the ice cream sundae in favor of a [place] yogurt parfait. Instead of a cup of ice cream or fro-yo for dessert, try 1 cup of nonfat Greek yogurt topped with fresh berries and a sprinkle of cacao nibs. Both toppings are packed with antioxidants, which can help reduce cellular inflammation and oxidative damage. And the comparison is a no-brainer: One cup of ice cream has about 275 calories; one cup of frozen yogurt has about 230; and a cup of Greek yogurt has just 130, plus twice the protein, so you're less likely to return to  the freezer for a second helping. 13. Put olive oil in a spray container instead of using it directly from the bottle. Each tablespoon of olive oil is 120 calories and 15g of fat. Use a mister instead of pouring it straight into the pan or onto a salad. This  allows for portion control and will save you more than 100 calories. 14. When baking, substitute canned pumpkin for butter or oil. Canned pumpkin-not pumpkin pie mix-is loaded with Vitamin A, which is important for skin and eye health, as well as immunity. And the comparisons are pretty crazy:  cup of canned pumpkin has about 40 calories, compared to butter or oil, which has more than 800 calories. Yes, 800 calories. Applesauce and mashed banana can also serve as good substitutions for butter or oil, usually in a 1:1 ratio. 15. Top casseroles with high-fiber cereal instead of breadcrumbs. Breadcrumbs are typically made with white bread, while breakfast cereals contain 5-9g of fiber per serving. Not only will you save more than 150 calories per  cup serving, the swap will also keep you more full and you'll get a metabolism boost from the added fiber. 16. Snack on pistachios instead of macadamia nuts. Believe it or not, you get the same amount of calories from 35 pistachios (100 calories) as you would from only five macadamia nuts. 17. Chow down on kale chips rather than potato chips. This is my favorite 'don't knock it 'till you try it' swap. Kale chips are so easy to make at home, and you can spice them up with a little grated parmesan or chili powder. Plus, they're a mere fraction of the calories of potato chips, but with the same crunch factor we crave so often. 18. Add seltzer and some fruit slices to your cocktail instead of soda or fruit juice. One cup of soda or fruit juice can pack on as much as 140 calories. Instead, use seltzer and fruit slices. The fruit provides valuable phytochemicals, such as flavonoids and anthocyanins, which help to  combat cancer and stave off the aging process.   If you continue to have increasing anxiety it is important to let Dr Ronnald Ramp know to advocate for further reduction of this. Continue distraction activities, like going for a walk and exercise.     Vit D  & Vit C 1,000 mg   are recommended to help protect  against the Covid-19 and other Corona viruses.    Also it's recommended  to take  Zinc 50 mg  to help  protect against the Covid-19   and best place to get  is also on [email or web address]  and don't pay more than 6-8 cents /pill !  ================================ Coronavirus (COVID-19) Are you at risk?  Are you at risk for the Coronavirus (COVID-19)?  To be considered HIGH RISK for Coronavirus (COVID-19), you have to meet the following criteria:  . Traveled to [place], [place], [place], [place] or [place]; or in the [place] to [place], [place], [place]  . or [place]; and have fever, cough, and shortness of breath within the last 2 weeks of travel OR . Been in close contact with a person diagnosed with COVID-19 within the last 2 weeks and have  . fever, cough,and shortness of breath .  . IF YOU DO NOT MEET THESE CRITERIA, YOU ARE CONSIDERED LOW RISK FOR COVID-19.  What to do if you are HIGH RISK for COVID-19?  Marland Kitchen If you are having a medical emergency, call 911. . Seek medical care right away. Before you go to a doctor's office, urgent care or emergency department, .  call ahead and tell them about your recent travel, contact with someone diagnosed with COVID-19  .  and your symptoms.  . You should receive instructions from your physician's office regarding  next steps of care.  . When you arrive at healthcare provider, tell the healthcare staff immediately you have returned from  . visiting [place], [place], [place], [place] or [place]; or traveled in the [place] to [place], [place],  . [place] or [place] in the last two weeks or you have been in close contact  with a person diagnosed with  . COVID-19 in the last 2 weeks.   . Tell the health care staff about your symptoms: fever, cough and shortness of breath. . After you have been seen by a medical provider, you will be either: o Tested for (COVID-19) and discharged home on quarantine except to seek medical care if  o symptoms worsen, and asked to  - Stay home and avoid contact with others until you get your results (4-5 days)  - Avoid travel on public transportation if possible (such as bus, train, or airplane) or o Sent to the Emergency Department by EMS for evaluation, COVID-19 testing  and  o possible admission depending on your condition and test results.  What to do if you are LOW RISK for COVID-19?  Reduce your risk of any infection by using the same precautions used for avoiding the common cold or flu:  Marland Kitchen Wash your hands often with soap and warm water for at least 20 seconds.  If soap and water are not readily available,  . use an alcohol-based hand sanitizer with at least 60% alcohol.  . If coughing or sneezing, cover your mouth and nose by coughing or sneezing into the elbow areas of your shirt or coat, .  into a tissue or into your sleeve (not your hands). . Avoid shaking hands with others and consider head nods or verbal greetings only. . Avoid touching your eyes, nose, or mouth with unwashed hands.  . Avoid close contact with people who are sick. . Avoid places or events with large numbers of people in one location, like concerts or sporting events. . Carefully consider travel plans you have or are making. . If you are planning any travel outside or inside the [place], visit the CDC's Travelers' Health webpage for the latest health notices. . If you have some symptoms but not all symptoms, continue to monitor at home and seek medical attention  . if your symptoms worsen. . If you are having a medical emergency, call 911. >>>>>>>>>>>>>>>>>>>>>>> Preventive Care for Adults  A healthy  lifestyle and preventive care can promote health and wellness. Preventive health guidelines for women include the following key practices.  A routine yearly physical is a good way to check with your health care provider about your health and preventive screening. It is a chance to share any concerns and updates on your health and to receive a thorough exam.  Visit your dentist for a routine exam and preventive care every 6 months. Brush your teeth twice a day and floss once a day. Good oral hygiene prevents tooth decay and gum disease.  The frequency of eye exams is based on your age, health, family medical history, use of contact lenses, and other factors. Follow your health care provider's recommendations for frequency of eye exams.  Eat a healthy diet. Foods like vegetables, fruits, whole grains, low-fat dairy products, and lean protein foods contain the nutrients you need without too many calories. Decrease your intake of foods high in solid fats, added sugars, and salt. Eat the right amount of calories for you. Get information about a proper diet from  your health care provider, if necessary.  Regular physical exercise is one of the most important things you can do for your health. Most adults should get at least 150 minutes of moderate-intensity exercise (any activity that increases your heart rate and causes you to sweat) each week. In addition, most adults need muscle-strengthening exercises on 2 or more days a week.  Maintain a healthy weight. The body mass index (BMI) is a screening tool to identify possible weight problems. It provides an estimate of body fat based on height and weight. Your health care provider can find your BMI and can help you achieve or maintain a healthy weight. For adults 20 years and older:  A BMI below 18.5 is considered underweight.  A BMI of 18.5 to 24.9 is normal.  A BMI of 25 to 29.9 is considered overweight.  A BMI of 30 and above is considered  obese.  Maintain normal blood lipids and cholesterol levels by exercising and minimizing your intake of saturated fat. Eat a balanced diet with plenty of fruit and vegetables. Blood tests for lipids and cholesterol should begin at age 81 and be repeated every 5 years. If your lipid or cholesterol levels are high, you are over 50, or you are at high risk for heart disease, you may need your cholesterol levels checked more frequently. Ongoing high lipid and cholesterol levels should be treated with medicines if diet and exercise are not working.  If you smoke, find out from your health care provider how to quit. If you do not use tobacco, do not start.  Lung cancer screening is recommended for adults aged 89-80 years who are at high risk for developing lung cancer because of a history of smoking. A yearly low-dose CT scan of the lungs is recommended for people who have at least a 30-pack-year history of smoking and are a current smoker or have quit within the past 15 years. A pack year of smoking is smoking an average of 1 pack of cigarettes a day for 1 year (for example: 1 pack a day for 30 years or 2 packs a day for 15 years). Yearly screening should continue until the smoker has stopped smoking for at least 15 years. Yearly screening should be stopped for people who develop a health problem that would prevent them from having lung cancer treatment.  High blood pressure causes heart disease and increases the risk of stroke. Your blood pressure should be checked at least every 1 to 2 years. Ongoing high blood pressure should be treated with medicines if weight loss and exercise do not work.  If you are 52-71 years old, ask your health care provider if you should take aspirin to prevent strokes.  Diabetes screening involves taking a blood sample to check your fasting blood sugar level. This should be done once every 3 years, after age 30, if you are within normal weight and without risk factors for  diabetes. Testing should be considered at a younger age or be carried out more frequently if you are overweight and have at least 1 risk factor for diabetes.  Breast cancer screening is essential preventive care for women. You should practice "breast self-awareness." This means understanding the normal appearance and feel of your breasts and may include breast self-examination. Any changes detected, no matter how small, should be reported to a health care provider. Women in their 46s and 30s should have a clinical breast exam (CBE) by a health care provider as part of a regular  health exam every 1 to 3 years. After age 23, women should have a CBE every year. Starting at age 28, women should consider having a mammogram (breast X-ray test) every year. Women who have a family history of breast cancer should talk to their health care provider about genetic screening. Women at a high risk of breast cancer should talk to their health care providers about having an MRI and a mammogram every year.  Breast cancer gene (BRCA)-related cancer risk assessment is recommended for women who have family members with BRCA-related cancers. BRCA-related cancers include breast, ovarian, tubal, and peritoneal cancers. Having family members with these cancers may be associated with an increased risk for harmful changes (mutations) in the breast cancer genes BRCA1 and BRCA2. Results of the assessment will determine the need for genetic counseling and BRCA1 and BRCA2 testing.  Routine pelvic exams to screen for cancer are no longer recommended for nonpregnant women who are considered low risk for cancer of the pelvic organs (ovaries, uterus, and vagina) and who do not have symptoms. Ask your health care provider if a screening pelvic exam is right for you.  If you have had past treatment for cervical cancer or a condition that could lead to cancer, you need Pap tests and screening for cancer for at least 20 years after your  treatment. If Pap tests have been discontinued, your risk factors (such as having a new sexual partner) need to be reassessed to determine if screening should be resumed. Some women have medical problems that increase the chance of getting cervical cancer. In these cases, your health care provider may recommend more frequent screening and Pap tests.  Colorectal cancer can be detected and often prevented. Most routine colorectal cancer screening begins at the age of 16 years and continues through age 24 years. However, your health care provider may recommend screening at an earlier age if you have risk factors for colon cancer. On a yearly basis, your health care provider may provide home test kits to check for hidden blood in the stool. Use of a small camera at the end of a tube, to directly examine the colon (sigmoidoscopy or colonoscopy), can detect the earliest forms of colorectal cancer. Talk to your health care provider about this at age 20, when routine screening begins.  Direct exam of the colon should be repeated every 5-10 years through age 60 years, unless early forms of pre-cancerous polyps or small growths are found.  Hepatitis C blood testing is recommended for all people born from [birth year] through 1963 and any individual with known risks for hepatitis C.  Pra  Osteoporosis is a disease in which the bones lose minerals and strength with aging. This can result in serious bone fractures or breaks. The risk of osteoporosis can be identified using a bone density scan. Women ages 23 years and over and women at risk for fractures or osteoporosis should discuss screening with their health care providers. Ask your health care provider whether you should take a calcium supplement or vitamin D to reduce the rate of osteoporosis.  Menopause can be associated with physical symptoms and risks. Hormone replacement therapy is available to decrease symptoms and risks. You should talk to your health care provider  about whether hormone replacement therapy is right for you.  Use sunscreen. Apply sunscreen liberally and repeatedly throughout the day. You should seek shade when your shadow is shorter than you. Protect yourself by wearing long sleeves, pants, a wide-brimmed hat, and sunglasses year round,  whenever you are outdoors.  Once a month, do a whole body skin exam, using a mirror to look at the skin on your back. Tell your health care provider of new moles, moles that have irregular borders, moles that are larger than a pencil eraser, or moles that have changed in shape or color.  Stay current with required vaccines (immunizations).  Influenza vaccine. All adults should be immunized every year.  Tetanus, diphtheria, and acellular pertussis (Td, Tdap) vaccine. Pregnant women should receive 1 dose of Tdap vaccine during each pregnancy. The dose should be obtained regardless of the length of time since the last dose. Immunization is preferred during the 27th-36th week of gestation. An adult who has not previously received Tdap or who does not know her vaccine status should receive 1 dose of Tdap. This initial dose should be followed by tetanus and diphtheria toxoids (Td) booster doses every 10 years. Adults with an unknown or incomplete history of completing a 3-dose immunization series with Td-containing vaccines should begin or complete a primary immunization series including a Tdap dose. Adults should receive a Td booster every 10 years.  Varicella vaccine. An adult without evidence of immunity to varicella should receive 2 doses or a second dose if she has previously received 1 dose. Pregnant females who do not have evidence of immunity should receive the first dose after pregnancy. This first dose should be obtained before leaving the health care facility. The second dose should be obtained 4-8 weeks after the first dose.  Human papillomavirus (HPV) vaccine. Females aged 13-26 years who have not received  the vaccine previously should obtain the 3-dose series. The vaccine is not recommended for use in pregnant females. However, pregnancy testing is not needed before receiving a dose. If a female is found to be pregnant after receiving a dose, no treatment is needed. In that case, the remaining doses should be delayed until after the pregnancy. Immunization is recommended for any person with an immunocompromised condition through the age of 65 years if she did not get any or all doses earlier. During the 3-dose series, the second dose should be obtained 4-8 weeks after the first dose. The third dose should be obtained 24 weeks after the first dose and 16 weeks after the second dose.  Zoster vaccine. One dose is recommended for adults aged 32 years or older unless certain conditions are present.  Measles, mumps, and rubella (MMR) vaccine. Adults born before [birth year] generally are considered immune to measles and mumps. Adults born in [birth year] or later should have 1 or more doses of MMR vaccine unless there is a contraindication to the vaccine or there is laboratory evidence of immunity to each of the three diseases. A routine second dose of MMR vaccine should be obtained at least 28 days after the first dose for students attending postsecondary schools, health care workers, or international travelers. People who received inactivated measles vaccine or an unknown type of measles vaccine during 1963-1967 should receive 2 doses of MMR vaccine. People who received inactivated mumps vaccine or an unknown type of mumps vaccine before 1979 and are at high risk for mumps infection should consider immunization with 2 doses of MMR vaccine. For females of childbearing age, rubella immunity should be determined. If there is no evidence of immunity, females who are not pregnant should be vaccinated. If there is no evidence of immunity, females who are pregnant should delay immunization until after pregnancy. Unvaccinated health care  workers born before [birth year] who lack  laboratory evidence of measles, mumps, or rubella immunity or laboratory confirmation of disease should consider measles and mumps immunization with 2 doses of MMR vaccine or rubella immunization with 1 dose of MMR vaccine.  Pneumococcal 13-valent conjugate (PCV13) vaccine. When indicated, a person who is uncertain of her immunization history and has no record of immunization should receive the PCV13 vaccine. An adult aged 69 years or older who has certain medical conditions and has not been previously immunized should receive 1 dose of PCV13 vaccine. This PCV13 should be followed with a dose of pneumococcal polysaccharide (PPSV23) vaccine. The PPSV23 vaccine dose should be obtained at least 1 or more year(s) after the dose of PCV13 vaccine. An adult aged 36 years or older who has certain medical conditions and previously received 1 or more doses of PPSV23 vaccine should receive 1 dose of PCV13. The PCV13 vaccine dose should be obtained 1 or more years after the last PPSV23 vaccine dose.    Pneumococcal polysaccharide (PPSV23) vaccine. When PCV13 is also indicated, PCV13 should be obtained first. All adults aged 45 years and older should be immunized. An adult younger than age 86 years who has certain medical conditions should be immunized. Any person who resides in a nursing home or long-term care facility should be immunized. An adult smoker should be immunized. People with an immunocompromised condition and certain other conditions should receive both PCV13 and PPSV23 vaccines. People with human immunodeficiency virus (HIV) infection should be immunized as soon as possible after diagnosis. Immunization during chemotherapy or radiation therapy should be avoided. Routine use of PPSV23 vaccine is not recommended for American Indians, [place] Natives, or people younger than 65 years unless there are medical conditions that require PPSV23 vaccine. When indicated, people who have  unknown immunization and have no record of immunization should receive PPSV23 vaccine. One-time revaccination 5 years after the first dose of PPSV23 is recommended for people aged 19-64 years who have chronic kidney failure, nephrotic syndrome, asplenia, or immunocompromised conditions. People who received 1-2 doses of PPSV23 before age 37 years should receive another dose of PPSV23 vaccine at age 22 years or later if at least 5 years have passed since the previous dose. Doses of PPSV23 are not needed for people immunized with PPSV23 at or after age 31 years.  Preventive Services / Frequency   Ages 55 to 34 years  Blood pressure check.  Lipid and cholesterol check.  Lung cancer screening. / Every year if you are aged 48-80 years and have a 30-pack-year history of smoking and currently smoke or have quit within the past 15 years. Yearly screening is stopped once you have quit smoking for at least 15 years or develop a health problem that would prevent you from having lung cancer treatment.  Clinical breast exam.** / Every year after age 24 years.   BRCA-related cancer risk assessment.** / For women who have family members with a BRCA-related cancer (breast, ovarian, tubal, or peritoneal cancers).  Mammogram.** / Every year beginning at age 65 years and continuing for as long as you are in good health. Consult with your health care provider.  Pap test.** / Every 3 years starting at age 43 years through age 47 or 77 years with a history of 3 consecutive normal Pap tests.  HPV screening.** / Every 3 years from ages 69 years through ages 57 to 76 years with a history of 3 consecutive normal Pap tests.  Fecal occult blood test (FOBT) of stool. / Every year beginning  at age 43 years and continuing until age 16 years. You may not need to do this test if you get a colonoscopy every 10 years.  Flexible sigmoidoscopy or colonoscopy.** / Every 5 years for a flexible sigmoidoscopy or every 10 years for a  colonoscopy beginning at age 40 years and continuing until age 20 years.  Hepatitis C blood test.** / For all people born from [birth year] through 1963 and any individual with known risks for hepatitis C.  Skin self-exam. / Monthly.  Influenza vaccine. / Every year.  Tetanus, diphtheria, and acellular pertussis (Tdap/Td) vaccine.** / Consult your health care provider. Pregnant women should receive 1 dose of Tdap vaccine during each pregnancy. 1 dose of Td every 10 years.  Varicella vaccine.** / Consult your health care provider. Pregnant females who do not have evidence of immunity should receive the first dose after pregnancy.  Zoster vaccine.** / 1 dose for adults aged 55 years or older.  Pneumococcal 13-valent conjugate (PCV13) vaccine.** / Consult your health care provider.  Pneumococcal polysaccharide (PPSV23) vaccine.** / 1 to 2 doses if you smoke cigarettes or if you have certain conditions.  Meningococcal vaccine.** / Consult your health care provider.  Hepatitis A vaccine.** / Consult your health care provider.  Hepatitis B vaccine.** / Consult your health care provider. Screening for abdominal aortic aneurysm (AAA)  by ultrasound is recommended for people over 50 who have history of high blood pressure or who are current or former smokers. ++++++++++++++++++ Recommend Adult Low Dose Aspirin or  coated  Aspirin 81 mg daily  To reduce risk of Colon Cancer 40 %,  Skin Cancer 26 % ,  Melanoma 46%  and  Pancreatic cancer 60% +++++++++++++++++++ Vitamin D goal  is between 70-100.  Please make sure that you are taking your Vitamin D as directed.  It is very important as a natural anti-inflammatory  helping hair, skin, and nails, as well as reducing stroke and heart attack risk.  It helps your bones and helps with mood. It also decreases numerous cancer risks so please take it as directed.  Low Vit D is associated with a 200-300% higher risk for CANCER  and 200-300% higher risk  for HEART   ATTACK  &  STROKE.   .....................................Marland Kitchen It is also associated with higher death rate at younger ages,  autoimmune diseases like Rheumatoid arthritis, Lupus, Multiple Sclerosis.    Also many other serious conditions, like depression, Alzheimer's Dementia, infertility, muscle aches, fatigue, fibromyalgia - just to name a few. ++++++++++++++++++ Recommend the book "The END of DIETING" by Dr Excell Seltzer  & the book "The END of DIABETES " by Dr Excell Seltzer At [email or web address] - get book & Audio CD's    Being diabetic has a  300% increased risk for heart attack, stroke, cancer, and alzheimer- type vascular dementia. It is very important that you work harder with diet by avoiding all foods that are white. Avoid white rice (brown & wild rice is OK), white potatoes (sweetpotatoes in moderation is OK), White bread or wheat bread or anything made out of white flour like bagels, donuts, rolls, buns, biscuits, cakes, pastries, cookies, pizza crust, and pasta (made from white flour & egg whites) - vegetarian pasta or spinach or wheat pasta is OK. Multigrain breads like Arnold's or [employer], or multigrain sandwich thins or flatbreads.  Diet, exercise and weight loss can reverse and cure diabetes in the early stages.  Diet, exercise and weight loss is very important in the control  and prevention of complications of diabetes which affects every system in your body, ie. Brain - dementia/stroke, eyes - glaucoma/blindness, heart - heart attack/heart failure, kidneys - dialysis, stomach - gastric paralysis, intestines - malabsorption, nerves - severe painful neuritis, circulation - gangrene & loss of a leg(s), and finally cancer and Alzheimers.    I recommend avoid fried & greasy foods,  sweets/candy, white rice (brown or wild rice or Quinoa is OK), white potatoes (sweet potatoes are OK) - anything made from white flour - bagels, doughnuts, rolls, buns, biscuits,white and wheat breads,  pizza crust and traditional pasta made of white flour & egg white(vegetarian pasta or spinach or wheat pasta is OK).  Multi-grain bread is OK - like multi-grain flat bread or sandwich thins. Avoid alcohol in excess. Exercise is also important.    Eat all the vegetables you want - avoid meat, especially red meat and dairy - especially cheese.  Cheese is the most concentrated form of trans-fats which is the worst thing to clog up our arteries. Veggie cheese is OK which can be found in the fresh produce section at [employer] or [employer] or [employer]  ++++++++++++++++++++++ DASH Eating Plan  DASH stands for "Dietary Approaches to Stop Hypertension."   The DASH eating plan is a healthy eating plan that has been shown to reduce high blood pressure (hypertension). Additional health benefits may include reducing the risk of type 2 diabetes mellitus, heart disease, and stroke. The DASH eating plan may also help with weight loss. WHAT DO I NEED TO KNOW ABOUT THE DASH EATING PLAN? For the DASH eating plan, you will follow these general guidelines:  Choose foods with a percent daily value for sodium of less than 5% (as listed on the food label).  Use salt-free seasonings or herbs instead of table salt or sea salt.  Check with your health care provider or pharmacist before using salt substitutes.  Eat lower-sodium products, often labeled as "lower sodium" or "no salt added."  Eat fresh foods.  Eat more vegetables, fruits, and low-fat dairy products.  Choose whole grains. Look for the word "whole" as the first word in the ingredient list.  Choose fish   Limit sweets, desserts, sugars, and sugary drinks.  Choose heart-healthy fats.  Eat veggie cheese   Eat more home-cooked food and less restaurant, buffet, and fast food.  Limit fried foods.  Cook foods using methods other than frying.  Limit canned vegetables. If you do use them, rinse them well to decrease the sodium.  When eating  at a restaurant, ask that your food be prepared with less salt, or no salt if possible.                      WHAT FOODS CAN I EAT? Read Dr Fara Olden Fuhrman's books on The End of Dieting & The End of Diabetes  Grains Whole grain or whole wheat bread. Brown rice. Whole grain or whole wheat pasta. Quinoa, bulgur, and whole grain cereals. Low-sodium cereals. Corn or whole wheat flour tortillas. Whole grain cornbread. Whole grain crackers. Low-sodium crackers.  Vegetables Fresh or frozen vegetables (raw, steamed, roasted, or grilled). Low-sodium or reduced-sodium tomato and vegetable juices. Low-sodium or reduced-sodium tomato sauce and paste. Low-sodium or reduced-sodium canned vegetables.   Fruits All fresh, canned (in natural juice), or frozen fruits.  Protein Products  All fish and seafood.  Dried beans, peas, or lentils. Unsalted nuts and seeds. Unsalted canned beans.  Dairy Low-fat dairy products, such  as skim or 1% milk, 2% or reduced-fat cheeses, low-fat ricotta or cottage cheese, or plain low-fat yogurt. Low-sodium or reduced-sodium cheeses.  Fats and Oils Tub margarines without trans fats. Light or reduced-fat mayonnaise and salad dressings (reduced sodium). Avocado. Safflower, olive, or canola oils. Natural peanut or almond butter.  Other Unsalted popcorn and pretzels. The items listed above may not be a complete list of recommended foods or beverages. Contact your dietitian for more options.  ++++++++++++++++++  WHAT FOODS ARE NOT RECOMMENDED? Grains/ White flour or wheat flour White bread. White pasta. White rice. Refined cornbread. Bagels and croissants. Crackers that contain trans fat.  Vegetables  Creamed or fried vegetables. Vegetables in a . Regular canned vegetables. Regular canned tomato sauce and paste. Regular tomato and vegetable juices.  Fruits Dried fruits. Canned fruit in light or heavy syrup. Fruit juice.  Meat and Other Protein Products Meat in general -  RED meat & White meat.  Fatty cuts of meat. Ribs, chicken wings, all processed meats as bacon, sausage, bologna, salami, fatback, hot dogs, bratwurst and packaged luncheon meats.  Dairy Whole or 2% milk, cream, half-and-half, and cream cheese. Whole-fat or sweetened yogurt. Full-fat cheeses or blue cheese. Non-dairy creamers and whipped toppings. Processed cheese, cheese spreads, or cheese curds.  Condiments Onion and garlic salt, seasoned salt, table salt, and sea salt. Canned and packaged gravies. Worcestershire sauce. Tartar sauce. Barbecue sauce. Teriyaki sauce. Soy sauce, including reduced sodium. Steak sauce. Fish sauce. Oyster sauce. Cocktail sauce. Horseradish. Ketchup and mustard. Meat flavorings and tenderizers. Bouillon cubes. Hot sauce. Tabasco sauce. Marinades. Taco seasonings. Relishes.  Fats and Oils Butter, stick margarine, lard, shortening and bacon fat. Coconut, palm kernel, or palm oils. Regular salad dressings.  Pickles and olives. Salted popcorn and pretzels.  The items listed above may not be a complete list of foods and beverages to avoid.

## 2019-10-11 LAB — COMPLETE METABOLIC PANEL WITH GFR
AG Ratio: 1.9 (calc) (ref 1.0–2.5)
ALT: 18 U/L (ref 6–29)
AST: 17 U/L (ref 10–35)
Albumin: 4.3 g/dL (ref 3.6–5.1)
Alkaline phosphatase (APISO): 88 U/L (ref 31–125)
BUN: 12 mg/dL (ref 7–25)
CO2: 32 mmol/L (ref 20–32)
Calcium: 9.6 mg/dL (ref 8.6–10.2)
Chloride: 103 mmol/L (ref 98–110)
Creat: 0.97 mg/dL (ref 0.50–1.10)
GFR, Est African American: 81 mL/min/{1.73_m2} (ref >=60)
GFR, Est Non African American: 70 mL/min/{1.73_m2} (ref >=60)
Globulin: 2.3 g/dL (calc) (ref 1.9–3.7)
Glucose, Bld: 90 mg/dL (ref 65–99)
Potassium: 4.6 mmol/L (ref 3.5–5.3)
Sodium: 141 mmol/L (ref 135–146)
Total Bilirubin: 0.3 mg/dL (ref 0.2–1.2)
Total Protein: 6.6 g/dL (ref 6.1–8.1)

## 2019-10-11 LAB — CBC WITH DIFFERENTIAL/PLATELET
Absolute Monocytes: 522 cells/uL (ref 200–950)
Basophils Absolute: 30 cells/uL (ref 0–200)
Basophils Relative: 0.5 %
Eosinophils Absolute: 60 cells/uL (ref 15–500)
Eosinophils Relative: 1 %
HCT: 36.8 % (ref 35.0–45.0)
Hemoglobin: 12.4 g/dL (ref 11.7–15.5)
Lymphs Abs: 2088 cells/uL (ref 850–3900)
MCH: 31.9 pg (ref 27.0–33.0)
MCHC: 33.7 g/dL (ref 32.0–36.0)
MCV: 94.6 fL (ref 80.0–100.0)
MPV: 8.9 fL (ref 7.5–12.5)
Monocytes Relative: 8.7 %
Neutro Abs: 3300 cells/uL (ref 1500–7800)
Neutrophils Relative %: 55 %
Platelets: 260 10*3/uL (ref 140–400)
RBC: 3.89 10*6/uL (ref 3.80–5.10)
RDW: 12.9 % (ref 11.0–15.0)
Total Lymphocyte: 34.8 %
WBC: 6 10*3/uL (ref 3.8–10.8)

## 2019-10-11 LAB — URINALYSIS W MICROSCOPIC + REFLEX CULTURE
Bacteria, UA: NONE SEEN /HPF
Bilirubin Urine: NEGATIVE
Glucose, UA: NEGATIVE
Hgb urine dipstick: NEGATIVE
Hyaline Cast: NONE SEEN /LPF
Ketones, ur: NEGATIVE
Leukocyte Esterase: NEGATIVE
Nitrites, Initial: NEGATIVE
Protein, ur: NEGATIVE
RBC / HPF: NONE SEEN /HPF (ref 0–2)
Specific Gravity, Urine: 1.013 (ref 1.001–1.03)
Squamous Epithelial / HPF: NONE SEEN /HPF (ref <=5)
WBC, UA: NONE SEEN /HPF (ref 0–5)
pH: 6.5 (ref 5.0–8.0)

## 2019-10-11 LAB — LIPID PANEL
Cholesterol: 192 mg/dL (ref <200)
HDL: 77 mg/dL (ref >=50)
LDL Cholesterol (Calc): 97 mg/dL (calc)
Non-HDL Cholesterol (Calc): 115 mg/dL (calc) (ref <130)
Total CHOL/HDL Ratio: 2.5 (calc) (ref <5.0)
Triglycerides: 86 mg/dL (ref <150)

## 2019-10-11 LAB — NO CULTURE INDICATED

## 2019-10-11 LAB — HEMOGLOBIN A1C
Hgb A1c MFr Bld: 4.6 % of total Hgb (ref <5.7)
Mean Plasma Glucose: 85 (calc)
eAG (mmol/L): 4.7 (calc)

## 2019-10-11 LAB — VITAMIN B12: Vitamin B-12: 385 pg/mL (ref 200–1100)

## 2019-10-11 LAB — VITAMIN D 25 HYDROXY (VIT D DEFICIENCY, FRACTURES): Vit D, 25-Hydroxy: 32 ng/mL (ref 30–100)

## 2019-10-11 LAB — TSH: TSH: 1.66 mIU/L

## 2019-10-11 LAB — MAGNESIUM: Magnesium: 1.9 mg/dL (ref 1.5–2.5)

## 2019-10-11 LAB — INSULIN, RANDOM: Insulin: 4.4 u[IU]/mL

## 2019-10-12 LAB — PROLACTIN: Prolactin: 14.3 ng/mL

## 2019-10-12 LAB — ACTH: C206 ACTH: 11 pg/mL (ref 6–50)

## 2019-10-17 ENCOUNTER — Other Ambulatory Visit: Payer: Self-pay | Admitting: Adult Health Nurse Practitioner

## 2019-10-17 DIAGNOSIS — E559 Vitamin D deficiency, unspecified: Secondary | ICD-10-CM

## 2019-10-17 MED ORDER — CHOLECALCIFEROL 1.25 MG (50000 UT) PO CAPS: ORAL_CAPSULE | ORAL | 0 refills | Status: DC

## 2019-10-18 ENCOUNTER — Other Ambulatory Visit: Payer: Self-pay | Admitting: Physician Assistant

## 2019-10-18 DIAGNOSIS — F333 Major depressive disorder, recurrent, severe with psychotic symptoms: Secondary | ICD-10-CM | POA: Diagnosis not present

## 2019-11-15 DIAGNOSIS — F333 Major depressive disorder, recurrent, severe with psychotic symptoms: Secondary | ICD-10-CM | POA: Diagnosis not present

## 2019-12-13 DIAGNOSIS — F333 Major depressive disorder, recurrent, severe with psychotic symptoms: Secondary | ICD-10-CM | POA: Diagnosis not present

## 2019-12-29 DIAGNOSIS — F900 Attention-deficit hyperactivity disorder, predominantly inattentive type: Secondary | ICD-10-CM | POA: Diagnosis not present

## 2019-12-29 DIAGNOSIS — F411 Generalized anxiety disorder: Secondary | ICD-10-CM | POA: Diagnosis not present

## 2019-12-29 DIAGNOSIS — F3132 Bipolar disorder, current episode depressed, moderate: Secondary | ICD-10-CM | POA: Diagnosis not present

## 2020-01-10 DIAGNOSIS — F333 Major depressive disorder, recurrent, severe with psychotic symptoms: Secondary | ICD-10-CM | POA: Diagnosis not present

## 2020-02-07 DIAGNOSIS — F333 Major depressive disorder, recurrent, severe with psychotic symptoms: Secondary | ICD-10-CM | POA: Diagnosis not present

## 2020-02-28 DIAGNOSIS — F333 Major depressive disorder, recurrent, severe with psychotic symptoms: Secondary | ICD-10-CM | POA: Diagnosis not present

## 2020-03-11 ENCOUNTER — Other Ambulatory Visit: Payer: Self-pay | Admitting: Internal Medicine

## 2020-03-11 MED ORDER — CYCLOBENZAPRINE HCL 10 MG PO TABS: ORAL_TABLET | ORAL | 0 refills | Status: DC

## 2020-03-12 ENCOUNTER — Other Ambulatory Visit: Payer: Self-pay

## 2020-03-12 ENCOUNTER — Encounter: Payer: Self-pay | Admitting: Adult Health

## 2020-03-12 ENCOUNTER — Ambulatory Visit (INDEPENDENT_AMBULATORY_CARE_PROVIDER_SITE_OTHER): Payer: BC Managed Care – PPO | Admitting: Adult Health

## 2020-03-12 VITALS — BP 122/82 | HR 97 | Temp 97.3°F | Wt 211.0 lb

## 2020-03-12 DIAGNOSIS — K625 Hemorrhage of anus and rectum: Secondary | ICD-10-CM

## 2020-03-12 DIAGNOSIS — K5900 Constipation, unspecified: Secondary | ICD-10-CM | POA: Diagnosis not present

## 2020-03-12 NOTE — Patient Instructions (Addendum)
If having more constipation or hard stools add miralax and increase water intake (stool softener)   Monitor for 10-14 days, then if bleeding has resolved do hemoccult card and mail back  If having persistent or worsening bleeding let me know and will send to specialist to take a look    Constipation, Adult Constipation is when a person has fewer bowel movements in a week than normal, has difficulty having a bowel movement, or has stools that are dry, hard, or larger than normal. Constipation may be caused by an underlying condition. It may become worse with age if a person takes certain medicines and does not take in enough fluids. Follow these instructions at home: Eating and drinking   Eat foods that have a lot of fiber, such as fresh fruits and vegetables, whole grains, and beans.  Limit foods that are high in fat, low in fiber, or overly processed, such as french fries, hamburgers, cookies, candies, and soda.  Drink enough fluid to keep your urine clear or pale yellow. General instructions  Exercise regularly or as told by your health care provider.  Go to the restroom when you have the urge to go. Do not hold it in.  Take over-the-counter and prescription medicines only as told by your health care provider. These include any fiber supplements.  Practice pelvic floor retraining exercises, such as deep breathing while relaxing the lower abdomen and pelvic floor relaxation during bowel movements.  Watch your condition for any changes.  Keep all follow-up visits as told by your health care provider. This is important. Contact a health care provider if:  You have pain that gets worse.  You have a fever.  You do not have a bowel movement after 4 days.  You vomit.  You are not hungry.  You lose weight.  You are bleeding from the anus.  You have thin, pencil-like stools. Get help right away if:  You have a fever and your symptoms suddenly get worse.  You leak  stool or have blood in your stool.  Your abdomen is bloated.  You have severe pain in your abdomen.  You feel dizzy or you faint. This information is not intended to replace advice given to you by your health care provider. Make sure you discuss any questions you have with your health care provider. Document Revised: 11/13/2017 Document Reviewed: 05/21/2016 Elsevier Patient Education  2020 [employer]       Rectal Bleeding  Rectal bleeding is when blood passes out of the anus. People with rectal bleeding may notice bright red blood in their underwear or in the toilet after having a bowel movement. They may also have dark red or black stools. Rectal bleeding is usually a sign that something is wrong. Many things can cause rectal bleeding, including:  Hemorrhoids. These are blood vessels in the anus or rectum that are larger than normal.  Fistulas. These are abnormal passages in the rectum and anus.  Anal fissures. This is a tear in the anus.  Diverticulosis. This is a condition in which pockets or sacs project from the bowel.  Proctitis and colitis. These are conditions in which the rectum, colon, or anus become inflamed.  Polyps. These are growths that can be cancerous (malignant) or non-cancerous (benign).  Part of the rectum sticking out from the anus (rectal prolapse).  Certain medicines.  Intestinal infections. Follow these instructions at home: Pay attention to any changes in your symptoms. Take these actions to help lessen bleeding and  discomfort:  Eat a diet that is high in fiber. This will keep your stool soft, making it easier to pass stools without straining. Ask your health care provider what foods and drinks are high in fiber.  Drink enough fluid to keep your urine clear or pale yellow. This also helps to keep your stool soft.  Try taking a warm bath. This may help soothe any pain in your rectum.  Keep all follow-up visits as told by your health care  provider. This is important. Get help right away if:  You have new or increased rectal bleeding.  You have black or dark red stools.  You vomit blood or something that looks like coffee grounds.  You have pain or tenderness in your abdomen.  You have a fever.  You feel weak.  You feel nauseous.  You faint.  You have severe pain in your rectum.  You cannot have a bowel movement. This information is not intended to replace advice given to you by your health care provider. Make sure you discuss any questions you have with your health care provider. Document Revised: 07/24/2016 Document Reviewed: 01/27/2016 Elsevier Patient Education  2020 [employer].

## 2020-03-12 NOTE — Progress Notes (Signed)
Assessment and Plan:  Angelica Weeks was seen today for acute visit.  Diagnoses and all orders for this visit:  Rectal bleeding Constipation, unspecified constipation type Scant bright red rectal blood on tissue paper following idiopathic constipation episode which has since resolved DRE without palpable abnormality, no apparent hemorrhoids Rectal bleeding most likely related to recent constipation/impaction episode After extended discussion with patient will recommend monitor with bowel management for 2 weeks, check hemoccult in 2 weeks, GI referral if not resolving for anoscope/colonoscopy Will have her complete home hemoccult x 3 in 2 weeks  Recommended increased activity, reduced stress, add miralax and increase water intake Contact office if increasing bloody discharge, new fatigue or dizziness, recurrent constipation  Further disposition pending results of labs. Discussed med's effects and SE's.   Over 15 minutes of exam, counseling, chart review, and critical decision making was performed.   Future Appointments  Date Time Provider Department Center  10/16/2020 10:00 AM McClanahan, Bella Kennedy, NP [facility] None    ------------------------------------------------------------------------------------------------------------------   HPI BP 122/82   Pulse 97   Temp (!) 97.3 F (36.3 C)   Wt 211 lb (95.7 kg)   LMP  (LMP Unknown) Comment: "I've not had a period in 3-4 years"  SpO2 99%   BMI 34.32 kg/m   48 y.o.female presents for evaluation of constipation and rectal bleeding.   She reports last week had atypical constipation, typically has daily, hadn't had a BM in nearly 1 week, reports later in the week started having some abdominal discomfort/fullness, fecal urge without movement, denies nausea/vomiting, on Saturday 03/10/2020 finally took 3 dulcolax in the AM, no improvement, repeated with 3 more without benefit, then tried OTC suppository Sunday AM, unsure what, which did result in  large BM Sun, had frequent BMs and diarrhea all day Sunday, has had a few more BMs today. Reports abdominal fullness/discomfort has resolved, but has seen some red blood on tissue when she wipes and is concerned.   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 Had not worked in several years prior to this position.   Food intake reports has been similar, not dramatic change in lifestyle or activity other than new job. Gets up and moves around frequently due to back pain. Admits poor water intake, 1 bottle/day but this is typical and not new.    Adopted, no known family history. She denies GI hx, denies hemorrhoids.   Past Medical History:  Diagnosis Date  . ADD (attention deficit disorder)   . Bipolar 1 disorder, depressed (HCC)   . Cannabis abuse with psychotic disorder with delusions (HCC) 05/22/2018  . Cellulitis of left eyelid 01/2014  . Depression   . Dysmenorrhea   . Paranoid delusion (HCC) 05/22/2018     Allergies  Allergen Reactions  . Aspirin Other (See Comments)    Burns stomach    Current Outpatient Medications on File Prior to Visit  Medication Sig  . ALPRAZolam (XANAX) 1 MG tablet Take 1 mg by mouth at bedtime as needed for anxiety.  . Cholecalciferol 1.25 MG (50000 UT) capsule Take one tablet by mouth three days a week for twelve weeks.  . cyclobenzaprine (FLEXERIL) 10 MG tablet Take 1/2 to 1 tablet 2 to 3 x /day if needed for Muscle Spasms  . esomeprazole (NEXIUM) 40 MG capsule Take 1 capsule Daily for Acid Indigestion & Reflux  . lamoTRIgine (LAMICTAL) 200 MG tablet Take 2 tablets (400 mg total) by mouth at  bedtime.  . ondansetron (ZOFRAN) 4 MG tablet Take 1 tablet 3 x /day if needed for Nausea  . phentermine (ADIPEX-P) 37.5 MG tablet Take 1/2 to 1 tablet every Morning for Dieting & Weight Loss  . traZODone (DESYREL) 100 MG tablet Take 100 mg by mouth. Takes 200 mg at bedtime.  Marland Kitchen VRAYLAR 6  MG CAPS TAKE 1 CAPSULE BY MOUTH DAILY  . topiramate (TOPAMAX) 50 MG tablet TAKE 1 TABLET(50 MG) BY MOUTH TWICE DAILY (Patient not taking: Reported on 03/12/2020)   No current facility-administered medications on file prior to visit.    ROS: all negative except above.   Physical Exam:  BP 122/82   Pulse 97   Temp (!) 97.3 F (36.3 C)   Wt 211 lb (95.7 kg)   LMP  (LMP Unknown) Comment: "I've not had a period in 3-4 years"  SpO2 99%   BMI 34.32 kg/m   General Appearance: Well nourished, obese, in no apparent distress. Eyes: PERRLA, conjunctiva no swelling or erythema ENT/Mouth: No erythema, swelling, or exudate on post pharynx.  Tonsils not swollen or erythematous. Hearing normal.  Neck: Supple, thyroid normal.  Respiratory: Respiratory effort normal, BS equal bilaterally without rales, rhonchi, wheezing or stridor.  Cardio: RRR with no MRGs. Brisk peripheral pulses without edema.  Abdomen: Soft, + BS.  Mild tenderness LLQ, no guarding, rebound, hernias, masses. Lymphatics: Non tender without lymphadenopathy.  Musculoskeletal: Full ROM, 5/5 strength, normal gait.  Skin: Warm, dry without rashes, lesions, ecchymosis.  Neuro: Normal muscle tone Psych: Awake and oriented X 3, normal affect, Insight and Judgment appropriate.  Rectal exam: negative without mass, lesions, she does have generalized tenderness, sphincter tone normal, stool guaiac positive. Exam chaperoned by Chancy Hurter, [place], NP 12:44 PM [facility]

## 2020-03-13 ENCOUNTER — Telehealth: Payer: Self-pay

## 2020-03-13 NOTE — Telephone Encounter (Signed)
Patient is in 02 recall. Left message for patient to call to schedule yearly annual exam. Routed to Dr Hyacinth Meeker.

## 2020-03-29 DIAGNOSIS — F900 Attention-deficit hyperactivity disorder, predominantly inattentive type: Secondary | ICD-10-CM | POA: Diagnosis not present

## 2020-03-29 DIAGNOSIS — F3132 Bipolar disorder, current episode depressed, moderate: Secondary | ICD-10-CM | POA: Diagnosis not present

## 2020-03-29 DIAGNOSIS — F411 Generalized anxiety disorder: Secondary | ICD-10-CM | POA: Diagnosis not present

## 2020-03-30 DIAGNOSIS — F902 Attention-deficit hyperactivity disorder, combined type: Secondary | ICD-10-CM | POA: Diagnosis not present

## 2020-03-30 DIAGNOSIS — Z79899 Other long term (current) drug therapy: Secondary | ICD-10-CM | POA: Diagnosis not present

## 2020-03-30 DIAGNOSIS — F419 Anxiety disorder, unspecified: Secondary | ICD-10-CM | POA: Diagnosis not present

## 2020-03-30 DIAGNOSIS — R41840 Attention and concentration deficit: Secondary | ICD-10-CM | POA: Diagnosis not present

## 2020-04-23 DIAGNOSIS — H52202 Unspecified astigmatism, left eye: Secondary | ICD-10-CM | POA: Diagnosis not present

## 2020-04-23 DIAGNOSIS — H5212 Myopia, left eye: Secondary | ICD-10-CM | POA: Diagnosis not present

## 2020-04-23 DIAGNOSIS — H04123 Dry eye syndrome of bilateral lacrimal glands: Secondary | ICD-10-CM | POA: Diagnosis not present

## 2020-05-07 DIAGNOSIS — F902 Attention-deficit hyperactivity disorder, combined type: Secondary | ICD-10-CM | POA: Diagnosis not present

## 2020-05-07 DIAGNOSIS — F419 Anxiety disorder, unspecified: Secondary | ICD-10-CM | POA: Diagnosis not present

## 2020-05-07 DIAGNOSIS — Z79899 Other long term (current) drug therapy: Secondary | ICD-10-CM | POA: Diagnosis not present

## 2020-06-28 DIAGNOSIS — F3132 Bipolar disorder, current episode depressed, moderate: Secondary | ICD-10-CM | POA: Diagnosis not present

## 2020-06-28 DIAGNOSIS — F900 Attention-deficit hyperactivity disorder, predominantly inattentive type: Secondary | ICD-10-CM | POA: Diagnosis not present

## 2020-06-28 DIAGNOSIS — F411 Generalized anxiety disorder: Secondary | ICD-10-CM | POA: Diagnosis not present

## 2020-07-05 DIAGNOSIS — Z79899 Other long term (current) drug therapy: Secondary | ICD-10-CM | POA: Diagnosis not present

## 2020-07-05 DIAGNOSIS — F419 Anxiety disorder, unspecified: Secondary | ICD-10-CM | POA: Diagnosis not present

## 2020-07-05 DIAGNOSIS — F902 Attention-deficit hyperactivity disorder, combined type: Secondary | ICD-10-CM | POA: Diagnosis not present

## 2020-09-10 DIAGNOSIS — Z79899 Other long term (current) drug therapy: Secondary | ICD-10-CM | POA: Diagnosis not present

## 2020-09-10 DIAGNOSIS — F902 Attention-deficit hyperactivity disorder, combined type: Secondary | ICD-10-CM | POA: Diagnosis not present

## 2020-09-10 DIAGNOSIS — F331 Major depressive disorder, recurrent, moderate: Secondary | ICD-10-CM | POA: Diagnosis not present

## 2020-09-27 DIAGNOSIS — F411 Generalized anxiety disorder: Secondary | ICD-10-CM | POA: Diagnosis not present

## 2020-09-27 DIAGNOSIS — F3132 Bipolar disorder, current episode depressed, moderate: Secondary | ICD-10-CM | POA: Diagnosis not present

## 2020-09-27 DIAGNOSIS — F900 Attention-deficit hyperactivity disorder, predominantly inattentive type: Secondary | ICD-10-CM | POA: Diagnosis not present

## 2020-10-16 ENCOUNTER — Encounter: Payer: BC Managed Care – PPO | Admitting: Adult Health Nurse Practitioner

## 2020-12-27 DIAGNOSIS — F3132 Bipolar disorder, current episode depressed, moderate: Secondary | ICD-10-CM | POA: Diagnosis not present

## 2020-12-27 DIAGNOSIS — F900 Attention-deficit hyperactivity disorder, predominantly inattentive type: Secondary | ICD-10-CM | POA: Diagnosis not present

## 2020-12-27 DIAGNOSIS — F411 Generalized anxiety disorder: Secondary | ICD-10-CM | POA: Diagnosis not present

## 2020-12-28 ENCOUNTER — Other Ambulatory Visit: Payer: Self-pay

## 2020-12-28 DIAGNOSIS — K219 Gastro-esophageal reflux disease without esophagitis: Secondary | ICD-10-CM

## 2020-12-28 MED ORDER — ESOMEPRAZOLE MAGNESIUM 40 MG PO CPDR: DELAYED_RELEASE_CAPSULE | ORAL | 1 refills | Status: DC

## 2020-12-28 MED ORDER — ONDANSETRON HCL 4 MG PO TABS: ORAL_TABLET | ORAL | 0 refills | Status: DC

## 2020-12-28 MED ORDER — CYCLOBENZAPRINE HCL 10 MG PO TABS: ORAL_TABLET | ORAL | 0 refills | Status: DC

## 2021-01-01 DIAGNOSIS — F331 Major depressive disorder, recurrent, moderate: Secondary | ICD-10-CM | POA: Diagnosis not present

## 2021-01-01 DIAGNOSIS — Z79899 Other long term (current) drug therapy: Secondary | ICD-10-CM | POA: Diagnosis not present

## 2021-01-01 DIAGNOSIS — F902 Attention-deficit hyperactivity disorder, combined type: Secondary | ICD-10-CM | POA: Diagnosis not present

## 2021-01-01 DIAGNOSIS — F419 Anxiety disorder, unspecified: Secondary | ICD-10-CM | POA: Diagnosis not present

## 2021-01-22 ENCOUNTER — Ambulatory Visit (INDEPENDENT_AMBULATORY_CARE_PROVIDER_SITE_OTHER): Payer: BC Managed Care – PPO | Admitting: Adult Health Nurse Practitioner

## 2021-01-22 ENCOUNTER — Encounter: Payer: Self-pay | Admitting: Adult Health Nurse Practitioner

## 2021-01-22 ENCOUNTER — Other Ambulatory Visit: Payer: Self-pay

## 2021-01-22 VITALS — BP 128/80 | HR 116 | Temp 97.2°F | Ht 65.0 in | Wt 225.4 lb

## 2021-01-22 DIAGNOSIS — K219 Gastro-esophageal reflux disease without esophagitis: Secondary | ICD-10-CM

## 2021-01-22 DIAGNOSIS — Z130 Encounter for screening for diseases of the blood and blood-forming organs and certain disorders involving the immune mechanism: Secondary | ICD-10-CM

## 2021-01-22 DIAGNOSIS — Z13220 Encounter for screening for lipoid disorders: Secondary | ICD-10-CM

## 2021-01-22 DIAGNOSIS — Z1389 Encounter for screening for other disorder: Secondary | ICD-10-CM

## 2021-01-22 DIAGNOSIS — H6123 Impacted cerumen, bilateral: Secondary | ICD-10-CM

## 2021-01-22 DIAGNOSIS — E559 Vitamin D deficiency, unspecified: Secondary | ICD-10-CM | POA: Diagnosis not present

## 2021-01-22 DIAGNOSIS — Z1329 Encounter for screening for other suspected endocrine disorder: Secondary | ICD-10-CM

## 2021-01-22 DIAGNOSIS — F988 Other specified behavioral and emotional disorders with onset usually occurring in childhood and adolescence: Secondary | ICD-10-CM

## 2021-01-22 DIAGNOSIS — Z6831 Body mass index (BMI) 31.0-31.9, adult: Secondary | ICD-10-CM

## 2021-01-22 DIAGNOSIS — Z1321 Encounter for screening for nutritional disorder: Secondary | ICD-10-CM

## 2021-01-22 DIAGNOSIS — H60503 Unspecified acute noninfective otitis externa, bilateral: Secondary | ICD-10-CM

## 2021-01-22 DIAGNOSIS — F1221 Cannabis dependence, in remission: Secondary | ICD-10-CM

## 2021-01-22 DIAGNOSIS — Z0000 Encounter for general adult medical examination without abnormal findings: Secondary | ICD-10-CM

## 2021-01-22 DIAGNOSIS — F3341 Major depressive disorder, recurrent, in partial remission: Secondary | ICD-10-CM

## 2021-01-22 DIAGNOSIS — Z131 Encounter for screening for diabetes mellitus: Secondary | ICD-10-CM | POA: Diagnosis not present

## 2021-01-22 DIAGNOSIS — R5383 Other fatigue: Secondary | ICD-10-CM | POA: Diagnosis not present

## 2021-01-22 DIAGNOSIS — Z136 Encounter for screening for cardiovascular disorders: Secondary | ICD-10-CM

## 2021-01-22 DIAGNOSIS — R7989 Other specified abnormal findings of blood chemistry: Secondary | ICD-10-CM

## 2021-01-22 DIAGNOSIS — F319 Bipolar disorder, unspecified: Secondary | ICD-10-CM

## 2021-01-22 DIAGNOSIS — H9203 Otalgia, bilateral: Secondary | ICD-10-CM

## 2021-01-22 DIAGNOSIS — F17200 Nicotine dependence, unspecified, uncomplicated: Secondary | ICD-10-CM

## 2021-01-22 MED ORDER — FLUCONAZOLE 100 MG PO TABS: 100.0000 mg | ORAL_TABLET | Freq: Every day | ORAL | 0 refills | Status: AC

## 2021-01-22 MED ORDER — HYDROCORTISONE-ACETIC ACID 1-2 % OT SOLN: 3.0000 [drp] | Freq: Two times a day (BID) | OTIC | 1 refills | Status: DC

## 2021-01-22 NOTE — Patient Instructions (Signed)
  HOW TO SCHEDULE MAMMOGRAM [facility]  7 a.m.-6:30 p.m., Monday 7 a.m.-5 p.m., Tuesday-Friday Schedule an appointment by calling [phone].  There are other locations to get your bone density, DEXA scan.  If you have a preference please let our office know.    We are going to send in ear drops to use twice a day AND also Diflucan tablet to take once daily for 7 days.  Please let us know if you have any new or worsening symptoms    GENERAL HEALTH GOALS  Know what a healthy weight is for you (roughly BMI <25) and aim to maintain this  Aim for 7+ servings of fruits and vegetables daily  70-80+ fluid ounces of water or unsweet tea for healthy kidneys  Limit to max 1 drink of alcohol per day; avoid smoking/tobacco  Limit animal fats in diet for cholesterol and heart health - choose grass fed whenever available  Avoid highly processed foods, and foods high in saturated/trans fats  Aim for low stress - take time to unwind and care for your mental health  Aim for 150 min of moderate intensity exercise weekly for heart health, and weights twice weekly for bone health  Aim for 7-9 hours of sleep daily

## 2021-01-22 NOTE — Progress Notes (Signed)
Complete Physical  Assessment and Plan:  Angelica Weeks was seen today for annual exam. Diagnoses and all orders for this visit:  Encounter for annual physical exam Yearly  Gastroesophageal reflux disease, unspecified whether esophagitis present Doing well at this time Taking Nexium 40mg  PRN -     TSH -     Magnesium  Vitamin D deficiency Continue supplementation -     VITAMIN D 25 Hydroxy (Vit-D Deficiency)  Bipolar 1 disorder, depressed (HCC) Recurrent major depressive disorder, in partial remission (HCC) Follows with Dr Taking Vraylar 6mg  daily & Lamitctal 200mg  two tablets (400mg ) daily. Discussed reaching out to discussed increased anxiety Discussed distraction: increase walking, walking dog, exercise -     CBC with Differential/Platelet -     COMPLETE METABOLIC PANEL WITH GFR   Attention deficit disorder (ADD) without hyperactivity Doing well at this time No medications Going to clinic Adderall 30mg XR in am & 20mg XR PM  Smoker unmotivated to quit One pack a day Discussed smoking cessation Not ready to quit at this time  History of cannabis dependence/abuse (HCC) Denies any current use Continue to monitor           BMI 31.0-31.9,adult -     Hemoglobin A1c -     Insulin, random  Otalgia Bilateral Cerumen impaction Ear lavage, tolerate well Bilateral canals clear, TM intact  Otitis Externia Rx acetic acid - hydrocortisone Rx diflucan 100mg  one tablet daily x7days      Screening cholesterol level -     Lipid panel  Screening for thyroid disorder -     TSH  Screening for diabetes mellitus -     Hemoglobin A1c -     Insulin, random  Screening for blood or protein in urine -     Urinalysis w microscopic + reflex cultur  Encounter for vitamin deficiency screening -     Vitamin B12  Screening for cardiovascular condition -     EKG 12-Lead   We will contact you in 1-3 business days with lab results drawn today.  Follow up in 4 week(s) for  weight management, food journal review.   Call or return with new or worsening symptoms as discussed in appointment.  May contact office via phone [phone] or MyChart.   Discussed med's effects and SE's. Screening labs and tests as requested with regular follow-up as recommended. Over 40 minutes of interview, exam, counseling, chart review, and complex, high level critical decision making was performed this visit.   HPI  50 y.o. female  presents for a complete physical and follow up for has ADD (attention deficit disorder); Bipolar 1 disorder, depressed (HCC); GERD (gastroesophageal reflux disease); Smoker unmotivated to quit; Overweight (BMI 25.0-29.9); and Elevated prolactin level on their problem list..  She is using peroxide peroxide BID x4 week.  Bilateral ears are itchy  She reports that over the past year she has gained about 40lbs.  She is concerned as she is is eating healthy and exercising regularly.  She has also been taking phemertime and has added topamax at one point.  She is not taking topamx now as it was not helping.  She also reports that her hair has been thinning and her hair dresser mentioned "she is loosing significant amount of hair".  She denies any bald spots, itchy scalp, brittle nails or dry skin.  Report she is having some SOB with sitting.  She is a current smoker and smokes a pack a day.  She reports it will happen  suddenly and she has to take deep breaths to help.  She does report and increase in stress and anxiety.  She manages her bipolar disorder with Vraylar and lamictal.  She is seeing Deatra Robinson, MD and she is managing these medications.  Reports doing well with the medicaitons.  She has had a phone visit two weeks ago.  She reports she has had an increase in stress.   She exercises or takes the dog for a walk.  She is requesting something to help with her anxiety.  Reports she used to take alprazolam once a day to help.    Her blood pressure has been  controlled at home, today their BP is BP: 128/80 She does workout. She denies chest pain, shortness of breath, dizziness.   She is not on cholesterol medication and denies myalgias. Her cholesterol is at goal. The cholesterol last visit was:   Lab Results  Component Value Date   CHOL 169 01/22/2021   HDL 63 01/22/2021   LDLCALC 86 01/22/2021   TRIG 106 01/22/2021   CHOLHDL 2.7 01/22/2021    She has been working on diet and exercise for prediabetes, she is not on bASA, she is not on ACE/ARB and denies increased appetite, nausea, paresthesia of the feet, polydipsia, polyuria, visual disturbances, vomiting and weight loss. Last A1C in the office was:  Lab Results  Component Value Date   HGBA1C 5.0 01/22/2021    Last GFR: Lab Results  Component Value Date   GFRNONAA 73 01/22/2021   Lab Results  Component Value Date   GFRAA 85 01/22/2021    Patient is on Vitamin D supplement.   Lab Results  Component Value Date   VD25OH 32 10/10/2019      Current Medications:  Current Outpatient Medications on File Prior to Visit  Medication Sig Dispense Refill  . ALPRAZolam (XANAX) 1 MG tablet Take 1 mg by mouth at bedtime as needed for anxiety.    Marland Kitchen amphetamine-dextroamphetamine (ADDERALL XR) 20 MG 24 hr capsule Take 20 mg by mouth daily. In PM    . amphetamine-dextroamphetamine (ADDERALL XR) 30 MG 24 hr capsule Take 30 mg by mouth daily. Takes in the morning    . cyclobenzaprine (FLEXERIL) 10 MG tablet Take 1/2 to 1 tablet 2 to 3 x /day if needed for Muscle Spasms 90 tablet 0  . esomeprazole (NEXIUM) 40 MG capsule Take 1 capsule Daily for Acid Indigestion & Reflux 90 capsule 1  . lamoTRIgine (LAMICTAL) 200 MG tablet Take 2 tablets (400 mg total) by mouth at bedtime.    . ondansetron (ZOFRAN) 4 MG tablet Take 1 tablet 3 x /day if needed for Nausea 30 tablet 0  . traZODone (DESYREL) 100 MG tablet Take 100 mg by mouth. Takes 200 mg at bedtime.    Marland Kitchen VITAMIN D PO Take 10,000 Units by mouth  daily.    Marland Kitchen VRAYLAR 6 MG CAPS TAKE 1 CAPSULE BY MOUTH DAILY 30 capsule 3   No current facility-administered medications on file prior to visit.   Allergies:  Allergies  Allergen Reactions  . Aspirin Other (See Comments)    Burns stomach   Medical History:  She has ADD (attention deficit disorder); Bipolar 1 disorder, depressed (HCC); GERD (gastroesophageal reflux disease); Smoker unmotivated to quit; Overweight (BMI 25.0-29.9); and Elevated prolactin level on their problem list. Health Maintenance:   Immunization History  Administered Date(s) Administered  . PPD Test 02/13/2015, 04/13/2017  . Pneumococcal Polysaccharide-23 10/06/2018  . Tdap 02/13/2015  Vaccinations: TD or Tdap: 02/2015  Influenza: Declines  Pneumococcal: 2019-smoker Prevnar13: N/A Shingles: Zostavax/Shingrix: N/A   Preventative Care: Last colonoscopy: N/A Last mammogram: : DUE discussed with patient Last pap smear/pelvic exam: 12/2018 - postmenopausal, Ammenorrhea 3-4years. Dr Hyacinth Meeker DEXA:N/A Hep C screening [phone]): N/A    Names of Other Physician/Practitioners you currently use: 1. [facility] here for primary care 2. Eye Exam: DUE 3. Dental Exam DUE   Patient Care Team: [name], MD as PCP - General (Internal Medicine) Sallye Lat, MD as Consulting Physician (Optometry) Mitchel Honour, DO as Consulting Physician (Obstetrics and Gynecology) Andee Poles, MD as Consulting Physician (Psychiatry)  Surgical History:  She has a past surgical history that includes Wisdom tooth extraction. Family History:  Herfamily history includes Diabetes in her mother; Heart disease in her mother. She was adopted. Social History:  She reports that she has been smoking cigarettes. She started smoking about 32 years ago. She has a 29.00 pack-year smoking history. She has never used smokeless tobacco. She reports current alcohol use of about 14.0  standard drinks of alcohol per week. She reports previous drug use. Drug: Marijuana.  Review of Systems: Review of Systems  Constitutional: Negative for chills, diaphoresis, fever, malaise/fatigue and weight loss.  HENT: Positive for ear pain. Negative for congestion, ear discharge, hearing loss, nosebleeds, sinus pain, sore throat and tinnitus.   Eyes: Negative for blurred vision, double vision, photophobia, pain, discharge and redness.  Respiratory: Negative for cough, hemoptysis, sputum production, shortness of breath, wheezing and stridor.   Cardiovascular: Negative for chest pain, palpitations, orthopnea, claudication, leg swelling and PND.  Gastrointestinal: Negative for abdominal pain, blood in stool, constipation, diarrhea, heartburn, melena, nausea and vomiting.  Genitourinary: Negative for dysuria, flank pain, frequency, hematuria and urgency.  Musculoskeletal: Negative for back pain, falls, joint pain, myalgias and neck pain.  Skin: Negative for itching and rash.  Neurological: Negative for dizziness, tingling, tremors, sensory change, speech change, focal weakness, seizures, loss of consciousness, weakness and headaches.  Endo/Heme/Allergies: Negative for environmental allergies and polydipsia. Does not bruise/bleed easily.  Psychiatric/Behavioral: Negative for depression, hallucinations, memory loss, substance abuse and suicidal ideas. The patient is not nervous/anxious and does not have insomnia.     Physical Exam: Estimated body mass index is 37.51 kg/m as calculated from the following:   Height as of this encounter: 5\' 5"  (1.651 m).   Weight as of this encounter: 225 lb 6.4 oz (102.2 kg). BP 128/80   Pulse (!) 116   Temp (!) 97.2 F (36.2 C)   Ht 5\' 5"  (1.651 m)   Wt 225 lb 6.4 oz (102.2 kg)   LMP  (LMP Unknown) Comment: "I've not had a period in 3-4 years"  SpO2 99%   BMI 37.51 kg/m  General Appearance: Well nourished, in no apparent distress.  Eyes: PERRLA, EOMs,  conjunctiva no swelling or erythema, normal fundi and vessels.  Sinuses: No Frontal/maxillary tenderness  ENT/Mouth: Ext aud canals erythema, white flaky exudate, cerumen impaction bilaterrly,UTA TM bilaterally. Good dentition. No erythema, swelling, or exudate on post pharynx. Tonsils not swollen or erythematous. Hearing normal.  Neck: Supple, thyroid normal. No bruits  Respiratory: Respiratory effort normal, BS equal bilaterally without rales, rhonchi, wheezing or stridor.  Cardio: RRR without murmurs, rubs or gallops. Brisk peripheral pulses without edema.  Chest: symmetric, with normal excursions and percussion.  Breasts: Symmetric, without lumps, nipple discharge, retractions.  Abdomen: Soft, nontender, no guarding, rebound, hernias, masses, or organomegaly.  Lymphatics: Non  tender without lymphadenopathy.  Genitourinary:  Musculoskeletal: Full ROM all peripheral extremities,5/5 strength, and normal gait.  Skin: Warm, dry without rashes, lesions, ecchymosis. Neuro: Cranial nerves intact, reflexes equal bilaterally. Normal muscle tone, no cerebellar symptoms. Sensation intact.  Psych: Awake and oriented X 3, normal affect, Insight and Judgment appropriate.   EKG: Defer today  Elder Negus, NP 10:21 AM [facility]

## 2021-01-23 LAB — URINALYSIS W MICROSCOPIC + REFLEX CULTURE
Bacteria, UA: NONE SEEN /HPF
Bilirubin Urine: NEGATIVE
Glucose, UA: NEGATIVE
Hgb urine dipstick: NEGATIVE
Hyaline Cast: NONE SEEN /LPF
Ketones, ur: NEGATIVE
Leukocyte Esterase: NEGATIVE
Nitrites, Initial: NEGATIVE
Protein, ur: NEGATIVE
Specific Gravity, Urine: 1.023 (ref 1.001–1.03)
WBC, UA: NONE SEEN /HPF (ref 0–5)
pH: 7.5 (ref 5.0–8.0)

## 2021-01-23 LAB — LIPID PANEL
Cholesterol: 169 mg/dL (ref <200)
HDL: 63 mg/dL (ref >=50)
LDL Cholesterol (Calc): 86 mg/dL (calc)
Non-HDL Cholesterol (Calc): 106 mg/dL (calc) (ref <130)
Total CHOL/HDL Ratio: 2.7 (calc) (ref <5.0)
Triglycerides: 106 mg/dL (ref <150)

## 2021-01-23 LAB — CBC WITH DIFFERENTIAL/PLATELET
Absolute Monocytes: 502 cells/uL (ref 200–950)
Basophils Absolute: 40 cells/uL (ref 0–200)
Basophils Relative: 0.7 %
Eosinophils Absolute: 51 cells/uL (ref 15–500)
Eosinophils Relative: 0.9 %
HCT: 41.4 % (ref 35.0–45.0)
Hemoglobin: 14 g/dL (ref 11.7–15.5)
Lymphs Abs: 1687 cells/uL (ref 850–3900)
MCH: 31.4 pg (ref 27.0–33.0)
MCHC: 33.8 g/dL (ref 32.0–36.0)
MCV: 92.8 fL (ref 80.0–100.0)
MPV: 8.7 fL (ref 7.5–12.5)
Monocytes Relative: 8.8 %
Neutro Abs: 3420 cells/uL (ref 1500–7800)
Neutrophils Relative %: 60 %
Platelets: 337 10*3/uL (ref 140–400)
RBC: 4.46 10*6/uL (ref 3.80–5.10)
RDW: 12.5 % (ref 11.0–15.0)
Total Lymphocyte: 29.6 %
WBC: 5.7 10*3/uL (ref 3.8–10.8)

## 2021-01-23 LAB — TSH: TSH: 1.99 mIU/L

## 2021-01-23 LAB — NO CULTURE INDICATED

## 2021-01-23 LAB — COMPLETE METABOLIC PANEL WITH GFR
AG Ratio: 1.6 (calc) (ref 1.0–2.5)
ALT: 13 U/L (ref 6–29)
AST: 14 U/L (ref 10–35)
Albumin: 4.5 g/dL (ref 3.6–5.1)
Alkaline phosphatase (APISO): 112 U/L (ref 31–125)
BUN: 13 mg/dL (ref 7–25)
CO2: 27 mmol/L (ref 20–32)
Calcium: 10.2 mg/dL (ref 8.6–10.2)
Chloride: 103 mmol/L (ref 98–110)
Creat: 0.93 mg/dL (ref 0.50–1.10)
GFR, Est African American: 85 mL/min/{1.73_m2} (ref >=60)
GFR, Est Non African American: 73 mL/min/{1.73_m2} (ref >=60)
Globulin: 2.8 g/dL (calc) (ref 1.9–3.7)
Glucose, Bld: 89 mg/dL (ref 65–99)
Potassium: 4.7 mmol/L (ref 3.5–5.3)
Sodium: 141 mmol/L (ref 135–146)
Total Bilirubin: 0.3 mg/dL (ref 0.2–1.2)
Total Protein: 7.3 g/dL (ref 6.1–8.1)

## 2021-01-23 LAB — HEMOGLOBIN A1C
Hgb A1c MFr Bld: 5 % of total Hgb (ref <5.7)
Mean Plasma Glucose: 97 mg/dL
eAG (mmol/L): 5.4 mmol/L

## 2021-01-23 LAB — VITAMIN B12: Vitamin B-12: 557 pg/mL (ref 200–1100)

## 2021-01-23 LAB — IRON, TOTAL/TOTAL IRON BINDING CAP
%SAT: 24 % (calc) (ref 16–45)
Iron: 92 ug/dL (ref 40–190)
TIBC: 391 mcg/dL (calc) (ref 250–450)

## 2021-02-26 DIAGNOSIS — F3132 Bipolar disorder, current episode depressed, moderate: Secondary | ICD-10-CM | POA: Diagnosis not present

## 2021-02-26 DIAGNOSIS — F411 Generalized anxiety disorder: Secondary | ICD-10-CM | POA: Diagnosis not present

## 2021-02-26 DIAGNOSIS — F900 Attention-deficit hyperactivity disorder, predominantly inattentive type: Secondary | ICD-10-CM | POA: Diagnosis not present

## 2021-04-01 DIAGNOSIS — F331 Major depressive disorder, recurrent, moderate: Secondary | ICD-10-CM | POA: Diagnosis not present

## 2021-04-01 DIAGNOSIS — Z79899 Other long term (current) drug therapy: Secondary | ICD-10-CM | POA: Diagnosis not present

## 2021-04-01 DIAGNOSIS — F419 Anxiety disorder, unspecified: Secondary | ICD-10-CM | POA: Diagnosis not present

## 2021-04-01 DIAGNOSIS — F902 Attention-deficit hyperactivity disorder, combined type: Secondary | ICD-10-CM | POA: Diagnosis not present

## 2021-04-13 ENCOUNTER — Other Ambulatory Visit: Payer: Self-pay | Admitting: Adult Health

## 2021-05-02 ENCOUNTER — Telehealth: Payer: Self-pay

## 2021-05-02 NOTE — Telephone Encounter (Signed)
Patient states that she is extremely dizzy. Woke up at 5 am and that's when it started. No other symptoms. Doesn't think she can drive. Please advise.

## 2021-05-03 NOTE — Telephone Encounter (Signed)
Left message on voice mail  to call back

## 2021-05-28 DIAGNOSIS — F900 Attention-deficit hyperactivity disorder, predominantly inattentive type: Secondary | ICD-10-CM | POA: Diagnosis not present

## 2021-05-28 DIAGNOSIS — F3132 Bipolar disorder, current episode depressed, moderate: Secondary | ICD-10-CM | POA: Diagnosis not present

## 2021-05-28 DIAGNOSIS — F411 Generalized anxiety disorder: Secondary | ICD-10-CM | POA: Diagnosis not present

## 2021-06-28 DIAGNOSIS — Z79899 Other long term (current) drug therapy: Secondary | ICD-10-CM | POA: Diagnosis not present

## 2021-06-28 DIAGNOSIS — F902 Attention-deficit hyperactivity disorder, combined type: Secondary | ICD-10-CM | POA: Diagnosis not present

## 2021-06-28 DIAGNOSIS — F419 Anxiety disorder, unspecified: Secondary | ICD-10-CM | POA: Diagnosis not present

## 2021-06-28 DIAGNOSIS — F331 Major depressive disorder, recurrent, moderate: Secondary | ICD-10-CM | POA: Diagnosis not present

## 2021-07-01 DIAGNOSIS — F902 Attention-deficit hyperactivity disorder, combined type: Secondary | ICD-10-CM | POA: Diagnosis not present

## 2021-07-19 ENCOUNTER — Other Ambulatory Visit: Payer: Self-pay

## 2021-07-19 MED ORDER — ONDANSETRON HCL 4 MG PO TABS: ORAL_TABLET | ORAL | 0 refills | Status: DC

## 2021-07-19 MED ORDER — CYCLOBENZAPRINE HCL 10 MG PO TABS: ORAL_TABLET | ORAL | 0 refills | Status: DC

## 2021-09-18 DIAGNOSIS — F902 Attention-deficit hyperactivity disorder, combined type: Secondary | ICD-10-CM | POA: Diagnosis not present

## 2021-09-18 DIAGNOSIS — F331 Major depressive disorder, recurrent, moderate: Secondary | ICD-10-CM | POA: Diagnosis not present

## 2021-09-18 DIAGNOSIS — F419 Anxiety disorder, unspecified: Secondary | ICD-10-CM | POA: Diagnosis not present

## 2021-09-18 DIAGNOSIS — Z79899 Other long term (current) drug therapy: Secondary | ICD-10-CM | POA: Diagnosis not present

## 2021-09-23 DIAGNOSIS — N951 Menopausal and female climacteric states: Secondary | ICD-10-CM | POA: Diagnosis not present

## 2021-09-23 DIAGNOSIS — E782 Mixed hyperlipidemia: Secondary | ICD-10-CM | POA: Diagnosis not present

## 2021-09-23 DIAGNOSIS — R635 Abnormal weight gain: Secondary | ICD-10-CM | POA: Diagnosis not present

## 2021-09-25 DIAGNOSIS — Z1331 Encounter for screening for depression: Secondary | ICD-10-CM | POA: Diagnosis not present

## 2021-09-25 DIAGNOSIS — R635 Abnormal weight gain: Secondary | ICD-10-CM | POA: Diagnosis not present

## 2021-09-25 DIAGNOSIS — Z6837 Body mass index (BMI) 37.0-37.9, adult: Secondary | ICD-10-CM | POA: Diagnosis not present

## 2021-09-25 DIAGNOSIS — Z1339 Encounter for screening examination for other mental health and behavioral disorders: Secondary | ICD-10-CM | POA: Diagnosis not present

## 2021-09-25 DIAGNOSIS — F331 Major depressive disorder, recurrent, moderate: Secondary | ICD-10-CM | POA: Diagnosis not present

## 2021-09-25 DIAGNOSIS — N951 Menopausal and female climacteric states: Secondary | ICD-10-CM | POA: Diagnosis not present

## 2021-10-02 ENCOUNTER — Other Ambulatory Visit: Payer: Self-pay | Admitting: Adult Health

## 2021-10-02 DIAGNOSIS — Z6836 Body mass index (BMI) 36.0-36.9, adult: Secondary | ICD-10-CM | POA: Diagnosis not present

## 2021-10-02 DIAGNOSIS — E669 Obesity, unspecified: Secondary | ICD-10-CM | POA: Diagnosis not present

## 2021-10-02 DIAGNOSIS — E782 Mixed hyperlipidemia: Secondary | ICD-10-CM | POA: Diagnosis not present

## 2021-10-03 ENCOUNTER — Other Ambulatory Visit: Payer: Self-pay | Admitting: Adult Health

## 2021-10-08 DIAGNOSIS — F411 Generalized anxiety disorder: Secondary | ICD-10-CM | POA: Diagnosis not present

## 2021-10-08 DIAGNOSIS — F900 Attention-deficit hyperactivity disorder, predominantly inattentive type: Secondary | ICD-10-CM | POA: Diagnosis not present

## 2021-10-08 DIAGNOSIS — F3132 Bipolar disorder, current episode depressed, moderate: Secondary | ICD-10-CM | POA: Diagnosis not present

## 2021-10-16 ENCOUNTER — Encounter: Payer: BC Managed Care – PPO | Admitting: Adult Health Nurse Practitioner

## 2021-10-21 DIAGNOSIS — E669 Obesity, unspecified: Secondary | ICD-10-CM | POA: Diagnosis not present

## 2021-10-21 DIAGNOSIS — Z6836 Body mass index (BMI) 36.0-36.9, adult: Secondary | ICD-10-CM | POA: Diagnosis not present

## 2021-10-21 DIAGNOSIS — N951 Menopausal and female climacteric states: Secondary | ICD-10-CM | POA: Diagnosis not present

## 2021-10-21 DIAGNOSIS — G479 Sleep disorder, unspecified: Secondary | ICD-10-CM | POA: Diagnosis not present

## 2021-11-18 DIAGNOSIS — E669 Obesity, unspecified: Secondary | ICD-10-CM | POA: Diagnosis not present

## 2021-11-18 DIAGNOSIS — R5382 Chronic fatigue, unspecified: Secondary | ICD-10-CM | POA: Diagnosis not present

## 2021-11-18 DIAGNOSIS — E782 Mixed hyperlipidemia: Secondary | ICD-10-CM | POA: Diagnosis not present

## 2021-11-18 DIAGNOSIS — Z6836 Body mass index (BMI) 36.0-36.9, adult: Secondary | ICD-10-CM | POA: Diagnosis not present

## 2021-11-20 DIAGNOSIS — F900 Attention-deficit hyperactivity disorder, predominantly inattentive type: Secondary | ICD-10-CM | POA: Diagnosis not present

## 2021-11-20 DIAGNOSIS — F3132 Bipolar disorder, current episode depressed, moderate: Secondary | ICD-10-CM | POA: Diagnosis not present

## 2021-11-20 DIAGNOSIS — F411 Generalized anxiety disorder: Secondary | ICD-10-CM | POA: Diagnosis not present

## 2021-12-04 DIAGNOSIS — E8881 Metabolic syndrome: Secondary | ICD-10-CM | POA: Diagnosis not present

## 2021-12-04 DIAGNOSIS — E782 Mixed hyperlipidemia: Secondary | ICD-10-CM | POA: Diagnosis not present

## 2021-12-04 DIAGNOSIS — Z6836 Body mass index (BMI) 36.0-36.9, adult: Secondary | ICD-10-CM | POA: Diagnosis not present

## 2021-12-04 NOTE — Progress Notes (Signed)
Future Appointments  Date Time Provider Department  12/05/2021  9:30 AM [name], MD [facility]  01/22/2022  9:00 AM Judd Gaudier, NP [facility]    History of Present Illness:    Patient is a very nice 50 yo [name] with hx/o   labile HTN, HLD, Prediabetes, GERD,  Vitamin D Deficiency, ADD, Bipolar disorder & Depression who presents with c/o "3 day hx/o intense dizziness"  which she describes has also had itching of her EACs & being light-headed. No congestion, cough, dyspnea or rash. Does not describe sx's suspicious for vertigo.     Note patient has gained about 88 # over the last 2 years  (due to Psychr meds) & is on lower dose Mounjaro thru the Weight Loss clinic w/o success with weight loss.  Medications    ALPRAZolam (XANAX) 1 MG tablet, Take 1 mg  at bedtime as needed for anxiety.   ADDERALL XR\ 20 MG 24 hr capsule, Take 20 mg daily. In PM   ADDERALL XR 30 MG 24 hr capsule, Take 30 mg  daily. Takes in the morning   cyclobenzaprine  10 MG tablet, TAKE 1/2 TO 1 TABLET 2 TO 3 X /DAY IF NEEDED    esomeprazole 40 MG capsule, Take 1 capsule Daily for Acid Indigestion & Reflux    lamoTRIgine (LAMICTAL) 200 MG tablet, Take 2 tablets (400 mg total)  at bedtime.    ondansetron 4 MG tablet, TAKE 1 TABLET 3 X /DAY IF NEEDED FOR NAUSEA    traZODone 100 MG tablet,  Takes 200 mg at bedtime.   VITAMIN D PO, Take 10,000 Units  daily.    VRAYLAR 6 MG CAPS, TAKE 1 CAPSULE DAILY  Problem list She has ADD (attention deficit disorder); Bipolar 1 disorder, depressed (HCC); GERD (gastroesophageal reflux disease); Smoker unmotivated to quit; Overweight (BMI 25.0-29.9); and Elevated prolactin level on their problem list.   Observations/Objective:  BP 123/84    P 106    T 97.9 F   R  16    Ht 5\' 5"  (    Wt 221 lb 9.6 oz   / SpO2 98%    BMI 36.88   HEENT - WNL except EACs inflamed & ceruminous.  Neck - supple.  Chest - Clear equal BS. Cor - Nl HS. RRR w/o sig MGR. PP 1(+). No  edema. MS- FROM w/o deformities.  Gait Nl. Neuro -  Nl w/o focal abnormalities.   Assessment and Plan:  1. Viral upper respiratory tract infection  - dexamethasone  4 MG tablet;  1 tab 3 x day - 3 days, then 2 x day - 3 days, then 1 tab daily   Dispense: 20 tablet  2. Chronic diffuse otitis externa of both ears  - acetic acid-hydrocortisone (VOSOL-HC) OTIC solution;  Instill  6 to 8 drops  to external ear canal 3 to 4 x /day   Dispense: 10 mL; Refill: 3  - Discussed meds & SE's.  - ROV in January to discuss weight loss mgmt.  Follow Up Instructions:       I discussed the assessment and treatment plan with the patient. The patient was provided an opportunity to ask questions and all were answered. The patient agreed with the plan and demonstrated an understanding of the instructions.       The patient was advised to call back or seek an in-person evaluation if the symptoms worsen or if the condition fails to improve as anticipated.   February  Oneta Rack, MD

## 2021-12-05 ENCOUNTER — Ambulatory Visit (INDEPENDENT_AMBULATORY_CARE_PROVIDER_SITE_OTHER): Payer: BC Managed Care – PPO | Admitting: Internal Medicine

## 2021-12-05 ENCOUNTER — Other Ambulatory Visit: Payer: Self-pay

## 2021-12-05 ENCOUNTER — Encounter: Payer: Self-pay | Admitting: Internal Medicine

## 2021-12-05 VITALS — BP 123/84 | HR 106 | Temp 97.9°F | Resp 16 | Ht 65.0 in | Wt 221.6 lb

## 2021-12-05 DIAGNOSIS — H60313 Diffuse otitis externa, bilateral: Secondary | ICD-10-CM

## 2021-12-05 DIAGNOSIS — J069 Acute upper respiratory infection, unspecified: Secondary | ICD-10-CM | POA: Diagnosis not present

## 2021-12-05 MED ORDER — HYDROCORTISONE-ACETIC ACID 1-2 % OT SOLN: OTIC | 3 refills | Status: DC

## 2021-12-05 MED ORDER — DEXAMETHASONE 4 MG PO TABS: ORAL_TABLET | ORAL | 0 refills | Status: DC

## 2021-12-09 NOTE — Patient Instructions (Signed)

## 2021-12-14 ENCOUNTER — Other Ambulatory Visit: Payer: Self-pay | Admitting: Nurse Practitioner

## 2021-12-20 DIAGNOSIS — F902 Attention-deficit hyperactivity disorder, combined type: Secondary | ICD-10-CM | POA: Diagnosis not present

## 2021-12-20 DIAGNOSIS — Z79899 Other long term (current) drug therapy: Secondary | ICD-10-CM | POA: Diagnosis not present

## 2021-12-30 NOTE — Progress Notes (Signed)
Assessment and Plan:  Angelica Weeks was seen today for acute visit.  Diagnoses and all orders for this visit:  Obesity, Class I, BMI 30-34.9 -     tirzepatide (MOUNJARO) 10 MG/0.5ML Pen; Inject 10 mg into the skin once a week. -  Goal is to get 30 grams of protein for breakfast/ lunch and dinner. Premier protein shakes or Fairlife protein shakes Encourage walking to get 120-140 heart rate and walk for 30 minutes 4 days a week  Attention deficit disorder (ADD) without hyperactivity -     amphetamine-dextroamphetamine (ADDERALL XR) 20 MG 24 hr capsule; Take 1 capsule (20 mg total) by mouth daily. In PM -     amphetamine-dextroamphetamine (ADDERALL XR) 30 MG 24 hr capsule; Take 1 capsule (30 mg total) by mouth daily. Takes in the morning If unable to get filled at [facility] pharmacy continue current medication       Further disposition pending results of labs. Discussed med's effects and SE's.   Over 30 minutes of exam, counseling, chart review, and critical decision making was performed.   Future Appointments  Date Time Provider Department Center  01/22/2022  9:00 AM Judd Gaudier, NP [facility] None    ------------------------------------------------------------------------------------------------------------------   HPI BP 102/74    Pulse (!) 116    Temp 97.7 F (36.5 C)    Wt 226 lb 3.2 oz (102.6 kg)    LMP  (LMP Unknown) Comment: "I've not had a period in 3-4 years"   SpO2 98%    BMI 37.64 kg/m  51 y.o.female presents for weight loss discussion  BMI is Body mass index is 37.64 kg/m., she has gained over 80 pounds in the last 2 years. She has tried Mounjaro 2.5 and has just started the 5 mg. Has had no side effects from the medication. Current dose has not changed appetite. Wt Readings from Last 3 Encounters:  01/01/22 226 lb 3.2 oz (102.6 kg)  12/05/21 221 lb 9.6 oz (100.5 kg)  01/22/21 225 lb 6.4 oz (102.2 kg)   Blood pressure is well controlled without medication BP  Readings from Last 3 Encounters:  01/01/22 102/74  12/05/21 123/84  01/22/21 128/80     Lab Results  Component Value Date   HGBA1C 5.0 01/22/2021    Her ADD has not been controlled well since she has not been on her Adderall due to the shortage.  Will send to [facility] pharmacy to determine if she can get med filled there.  Past Medical History:  Diagnosis Date   ADD (attention deficit disorder)    Bipolar 1 disorder, depressed (HCC)    Cannabis abuse with psychotic disorder with delusions (HCC) 05/22/2018   Cellulitis of left eyelid 01/2014   Depression    Dysmenorrhea    Paranoid delusion (HCC) 05/22/2018     Allergies  Allergen Reactions   Aspirin Other (See Comments)    Burns stomach    Current Outpatient Medications on File Prior to Visit  Medication Sig   ALPRAZolam (XANAX) 1 MG tablet Take 1 mg by mouth at bedtime as needed for anxiety.   amphetamine-dextroamphetamine (ADDERALL XR) 20 MG 24 hr capsule Take 20 mg by mouth daily. In PM   amphetamine-dextroamphetamine (ADDERALL XR) 30 MG 24 hr capsule Take 30 mg by mouth daily. Takes in the morning   cyclobenzaprine (FLEXERIL) 10 MG tablet TAKE 1/2 TO 1 TABLET 2 TO 3 X /DAY IF NEEDED FOR MUSCLE SPASMS   lamoTRIgine (LAMICTAL) 200 MG tablet Take  2 tablets (400 mg total) by mouth at bedtime.   ondansetron (ZOFRAN) 4 MG tablet TAKE 1 TABLET 3 TIMES DAILY IF NEEDED FOR NAUSEA   traZODone (DESYREL) 100 MG tablet Take 100 mg by mouth. Takes 200 mg at bedtime.   VITAMIN D PO Take 10,000 Units by mouth daily.   VRAYLAR 6 MG CAPS TAKE 1 CAPSULE BY MOUTH DAILY   acetic acid-hydrocortisone (VOSOL-HC) OTIC solution Instill  6 to 8 drops  to external ear canal 3 to 4 x /day (Patient not taking: Reported on 01/01/2022)   dexamethasone (DECADRON) 4 MG tablet Take 1 tab 3 x day - 3 days, then 2 x day - 3 days, then 1 tab daily (Patient not taking: Reported on 01/01/2022)   esomeprazole (NEXIUM) 40 MG capsule Take 1 capsule Daily for Acid  Indigestion & Reflux (Patient not taking: Reported on 12/05/2021)   No current facility-administered medications on file prior to visit.    ROS: all negative except above.   Physical Exam:  BP 102/74    Pulse (!) 116    Temp 97.7 F (36.5 C)    Wt 226 lb 3.2 oz (102.6 kg)    LMP  (LMP Unknown) Comment: "I've not had a period in 3-4 years"   SpO2 98%    BMI 37.64 kg/m   General Appearance: Pleasant obese female, in no apparent distress. Eyes: PERRLA, EOMs, conjunctiva no swelling or erythema Sinuses: No Frontal/maxillary tenderness ENT/Mouth: Ext aud canals clear, TMs without erythema, bulging. No erythema, swelling, or exudate on post pharynx.  Tonsils not swollen or erythematous. Hearing normal.  Neck: Supple, thyroid normal.  Respiratory: Respiratory effort normal, BS equal bilaterally without rales, rhonchi, wheezing or stridor.  Cardio: RRR with no MRGs. Brisk peripheral pulses without edema.  Abdomen: Soft, + BS.  Non tender, no guarding, rebound, hernias, masses. Lymphatics: Non tender without lymphadenopathy.  Musculoskeletal: Full ROM, 5/5 strength, normal gait.  Skin: Warm, dry without rashes, lesions, ecchymosis.  Neuro: Cranial nerves intact. Normal muscle tone, no cerebellar symptoms. Sensation intact.  Psych: Awake and oriented X 3, normal affect, Insight and Judgment appropriate.     Revonda Humphrey, NP 11:28 AM [name]

## 2022-01-01 ENCOUNTER — Other Ambulatory Visit (HOSPITAL_COMMUNITY): Payer: Self-pay

## 2022-01-01 ENCOUNTER — Ambulatory Visit (INDEPENDENT_AMBULATORY_CARE_PROVIDER_SITE_OTHER): Payer: BC Managed Care – PPO | Admitting: Nurse Practitioner

## 2022-01-01 ENCOUNTER — Other Ambulatory Visit: Payer: Self-pay

## 2022-01-01 ENCOUNTER — Encounter: Payer: Self-pay | Admitting: Nurse Practitioner

## 2022-01-01 VITALS — BP 102/74 | HR 116 | Temp 97.7°F | Wt 226.2 lb

## 2022-01-01 DIAGNOSIS — F988 Other specified behavioral and emotional disorders with onset usually occurring in childhood and adolescence: Secondary | ICD-10-CM

## 2022-01-01 DIAGNOSIS — E669 Obesity, unspecified: Secondary | ICD-10-CM | POA: Diagnosis not present

## 2022-01-01 MED ORDER — AMPHETAMINE-DEXTROAMPHET ER 20 MG PO CP24
20.0000 mg | ORAL_CAPSULE | Freq: Every day | ORAL | 0 refills | Status: DC
  Filled 2022-01-01 – 2022-05-01 (×2): qty 30, 30d supply, fill #0

## 2022-01-01 MED ORDER — MOUNJARO 10 MG/0.5ML ~~LOC~~ SOAJ: 10.0000 mg | SUBCUTANEOUS | 2 refills | Status: DC

## 2022-01-01 MED ORDER — AMPHETAMINE-DEXTROAMPHET ER 30 MG PO CP24
30.0000 mg | ORAL_CAPSULE | Freq: Every day | ORAL | 0 refills | Status: DC
  Filled 2022-01-01: qty 30, 30d supply, fill #0

## 2022-01-01 NOTE — Patient Instructions (Addendum)
Our goal is to get 30 grams of protein for breakfast/ lunch and dinner. Premier protein shakes or Fairlife protein shakes Encourage walking to get 120-140 heart rate and walk for 30 minutes 4 days a week

## 2022-01-03 ENCOUNTER — Other Ambulatory Visit: Payer: Self-pay

## 2022-01-03 ENCOUNTER — Other Ambulatory Visit (HOSPITAL_COMMUNITY): Payer: Self-pay

## 2022-01-03 DIAGNOSIS — E669 Obesity, unspecified: Secondary | ICD-10-CM

## 2022-01-03 MED ORDER — MOUNJARO 10 MG/0.5ML ~~LOC~~ SOAJ
10.0000 mg | SUBCUTANEOUS | 2 refills | Status: DC
  Filled 2022-01-03 – 2022-01-06 (×3): qty 2, 28d supply, fill #0

## 2022-01-06 ENCOUNTER — Other Ambulatory Visit (HOSPITAL_COMMUNITY): Payer: Self-pay

## 2022-01-06 ENCOUNTER — Telehealth: Payer: Self-pay

## 2022-01-06 NOTE — Telephone Encounter (Signed)
Prior Auth for Mounjaro denied.  

## 2022-01-08 ENCOUNTER — Ambulatory Visit: Payer: BC Managed Care – PPO | Admitting: Nurse Practitioner

## 2022-01-21 NOTE — Progress Notes (Signed)
Complete Physical  Assessment and Plan:  Angelica Weeks was seen today for annual exam. Diagnoses and all orders for this visit:  Encounter for Annual Physical Exam with abnormal findings Due annually  Health Maintenance reviewed Healthy lifestyle reviewed and goals set - Schedule GYN, mammogram, colonoscopy   Gastroesophageal reflux disease, unspecified whether esophagitis present Frequent breakthrough; try famotidine 40 mg at night Follow up if not improving in 2 weeks -     TSH -     Magnesium  Vitamin D deficiency Continue supplementation -     VITAMIN D 25 Hydroxy (Vit-D Deficiency)  Bipolar 1 disorder, depressed (HCC) Recurrent major depressive disorder, in partial remission ([place]) Follows with Dr [name] Taking Vraylar and lamictal Discussed distraction: increase walking, walking dog, exercise -     CBC with Differential/Platelet -     COMPLETE METABOLIC PANEL WITH GFR  Attention deficit disorder (ADD) without hyperactivity Doing well at this time Psych managing adderall   Smoker unmotivated to quit One pack a day Discussed smoking cessation Not ready to quit at this time  History of cannabis dependence/abuse ([place]) Denies any current use           Obesity - BMI 36 Long discussion about weight loss, diet, and exercise Recommended diet heavy in fruits and veggies and low in animal meats, cheeses, and dairy products, appropriate calorie intake Increase mounjaro to 15 mg Patient will work on increasing protein intake with each meal; protein handout given and reviewed Discussed appropriate weight for height  Follow up in 3 months   History of elevated prolactin Had normal MRI, no checks in last 2 years  Denies nipple discharge - prolactin       Screening cholesterol level -     Lipid panel  Screening for thyroid disorder -     TSH  Screening for diabetes mellitus -     Hemoglobin A1c -     Insulin, random  Screening for blood or protein in urine -      Urinalysis w microscopic -     Microalbumin   Screening for cardiovascular condition -     EKG 12-Lead  B12 deficiency - on supplement, recheck levels  Colon cancer screening Adopted, doesn't know family history, GI referral placed for colonoscopy after discussion.   Orders Placed This Encounter  Procedures   MM Digital Screening   CBC with Differential/Platelet   COMPLETE METABOLIC PANEL WITH GFR   Magnesium   Lipid panel   TSH   Hemoglobin A1c   VITAMIN D 25 Hydroxy (Vit-D Deficiency, Fractures)   Microalbumin / creatinine urine ratio   Urinalysis, Routine w reflex microscopic   Prolactin   Vitamin B12   Insulin, random   Ambulatory referral to Gastroenterology   EKG 12-Lead      Discussed med's effects and SE's. Screening labs and tests as requested with regular follow-up as recommended. Over 40 minutes of interview, exam, counseling, chart review, and complex, high level critical decision making was performed this visit.   Future Appointments  Date Time Provider [place]  01/22/2023  9:00 AM Liane Comber, NP [facility] None     HPI  52 y.o. female  presents for a complete physical and follow up for has ADD (attention deficit disorder); Bipolar 1 disorder, depressed ([place]); GERD (gastroesophageal reflux disease); Smoker unmotivated to quit; Obesity (BMI 30-39.9); and Elevated prolactin level on their problem list..  She is with long term partner, 2 kids 42 and 69.  She is  a home maker.   She is due to see GYN, last 12/2018, Dr. Hale Bogus. She has   She has  been having GERD, not controlled by tums, ranitidine worked well prior to being taken off the market.   She is a current smoker and smokes a pack a day since 1990, thinking about about reducing but not ready, fear of weight gain and admits to habitual use. Has failed chantix and wellbutrin in the past.   She is seeing [name], MD for bipolar depression, ADD, anxiety.  Bipolar currently doing  well on vraylar and lamictal, adderall XR for ADD, and PRN xanax. She is on trazodone in the evening. She reports doing much better with current regimen though does report ongoing fatigue.   She has hx of elevated prolactin up to 25 in 12/17/2018, had normal MRI 01/15/2019.   BMI is Body mass index is 36.97 kg/m., she has been working on diet and exercise. Walks with her dog, just restartedCut out alcohol, eating a lot of salad, coffee with no sugar, drinking only water. No bread, rare pasta.  She has gained about 65 lb since 2020, was stable around 160 lb, most likely due to antipsychotic.  Insurance doesn't cover weight loss meds, was started on mounjaro with coupon by another provider, recently on 10 mg/week, would like to increase to 15 mg.  Wt Readings from Last 3 Encounters:  01/22/22 225 lb 9.6 oz (102.3 kg)  01/01/22 226 lb 3.2 oz (102.6 kg)  12/05/21 221 lb 9.6 oz (100.5 kg)   Her blood pressure has been controlled at home, today their BP is BP: 110/70 She does workout. She denies chest pain, shortness of breath, dizziness.   She is not on cholesterol medication and denies myalgias. Her cholesterol is at goal. The cholesterol last visit was:   Lab Results  Component Value Date   CHOL 169 01/22/2021   HDL 63 01/22/2021   LDLCALC 86 01/22/2021   TRIG 106 01/22/2021   CHOLHDL 2.7 01/22/2021   She has been working on diet and exercise for glucose management, she is on mounjaro 10 mg/week, she is not on bASA, she is not on ACE/ARB and denies increased appetite, nausea, paresthesia of the feet, polydipsia, polyuria, visual disturbances, vomiting and weight loss. Last A1C in the office was:  Lab Results  Component Value Date   HGBA1C 5.0 01/22/2021   Last GFR: No results found for: EGFR  Patient is on Vitamin D supplement, taking 10000 IU Lab Results  Component Value Date   VD25OH 32 10/10/2019     She reports newly on B12 supplement -  Lab Results  Component Value Date    [id number] 270 01/22/2021     Current Medications:  Current Outpatient Medications on File Prior to Visit  Medication Sig Dispense Refill   ALPRAZolam (XANAX) 1 MG tablet Take 1 mg by mouth at bedtime as needed for anxiety.     amphetamine-dextroamphetamine (ADDERALL XR) 20 MG 24 hr capsule Take 1 capsule (20 mg total) by mouth daily in the evening 30 capsule 0   amphetamine-dextroamphetamine (ADDERALL XR) 30 MG 24 hr capsule Take 1 capsule (30 mg total) by mouth daily. Takes in the morning 30 capsule 0   cyclobenzaprine (FLEXERIL) 10 MG tablet TAKE 1/2 TO 1 TABLET 2 TO 3 X /DAY IF NEEDED FOR MUSCLE SPASMS 90 tablet 0   lamoTRIgine (LAMICTAL) 200 MG tablet Take 2 tablets (400 mg total) by mouth at bedtime.  ondansetron (ZOFRAN) 4 MG tablet TAKE 1 TABLET 3 TIMES DAILY IF NEEDED FOR NAUSEA 30 tablet 0   traZODone (DESYREL) 100 MG tablet Take 100 mg by mouth. Takes 200 mg at bedtime.     VITAMIN D PO Take 10,000 Units by mouth daily.     VRAYLAR 6 MG CAPS TAKE 1 CAPSULE BY MOUTH DAILY 30 capsule 3   No current facility-administered medications on file prior to visit.   Allergies:  Allergies  Allergen Reactions   Aspirin Other (See Comments)    Burns stomach   Medical History:  She has ADD (attention deficit disorder); Bipolar 1 disorder, depressed ([place]); GERD (gastroesophageal reflux disease); Smoker unmotivated to quit; Obesity (BMI 30-39.9); and Elevated prolactin level on their problem list.  Health Maintenance:   Immunization History  Administered Date(s) Administered   PPD Test 02/13/2015, 04/13/2017   Pneumococcal Polysaccharide-23 10/06/2018   Tdap 02/13/2015   Health Maintenance  Topic Date Due   COVID-19 Vaccine (1) Never done   HIV Screening  Never done   MAMMOGRAM  Never done   Hepatitis C Screening  Never done   COLONOSCOPY (Pts 45-59yrs Insurance coverage will need to be confirmed)  Never done   INFLUENZA VACCINE  Never done   PAP SMEAR-Modifier  12/17/2021    TETANUS/TDAP  02/12/2025   HPV VACCINES  Aged Out    Influenza: Declines  Pneumococcal: 2019-smoker  Last colonoscopy: DUE - GI referral placed  Last mammogram: never, order placed for breast center Last pap smear/pelvic exam: 12/2018 - postmenopausal, Ammenorrhea 3-4years. Dr Sabra Heck    Names of Other Physician/Practitioners you currently use: 1. [facility] here for primary care 2. Eye Exam: last 2022, goes annually  3. Dental Exam [facility], last 2022, going q6m   Patient Care Team: Unk Pinto, MD as PCP - General (Internal Medicine) Warden Fillers, MD as Consulting Physician (Optometry) Linda Hedges, DO as Consulting Physician (Obstetrics and Gynecology) Sheralyn Boatman, MD as Consulting Physician (Psychiatry) Megan Salon, MD as Consulting Physician (Obstetrics and Gynecology)  Surgical History:  She has a past surgical history that includes Wisdom tooth extraction. Family History:  HerShe was adopted. Family history is unknown by patient. Social History:  She reports that she has been smoking cigarettes. She started smoking about 33 years ago. She has a 29.00 pack-year smoking history. She has never used smokeless tobacco. She reports that she does not currently use alcohol. She reports that she does not currently use drugs after having used the following drugs: Marijuana.  Review of Systems: Review of Systems  Constitutional:  Positive for malaise/fatigue (chronic). Negative for weight loss.  HENT:  Negative for hearing loss, sore throat and tinnitus.   Eyes:  Negative for blurred vision and double vision.  Respiratory:  Negative for cough, sputum production, shortness of breath and wheezing.   Cardiovascular:  Negative for chest pain, palpitations, orthopnea, claudication, leg swelling and PND.  Gastrointestinal:  Positive for heartburn (worse at night). Negative for abdominal pain, blood in stool,  constipation, diarrhea, melena, nausea and vomiting.  Genitourinary: Negative.   Musculoskeletal:  Negative for falls, joint pain and myalgias.  Skin:  Negative for rash.  Neurological:  Negative for dizziness, tingling, sensory change, weakness and headaches.  Endo/Heme/Allergies:  Negative for polydipsia.  Psychiatric/Behavioral:  Negative for depression (improved), memory loss, substance abuse and suicidal ideas. The patient is not nervous/anxious and does not have insomnia.   All other systems reviewed and are negative.  Physical Exam: Estimated body mass index is 36.97 kg/m as calculated from the following:   Height as of this encounter: 5' 5.5" (1.664 m).   Weight as of this encounter: 225 lb 9.6 oz (102.3 kg). BP 110/70    Pulse (!) 107    Temp 97.9 F (36.6 C)    Ht 5' 5.5" (1.664 m)    Wt 225 lb 9.6 oz (102.3 kg)    LMP  (LMP Unknown) Comment: "I've not had a period in 3-4 years"   SpO2 98%    BMI 36.97 kg/m  General Appearance: Well nourished, in no apparent distress.  Eyes: PERRLA, EOMs, conjunctiva no swelling or erythema Sinuses: No Frontal/maxillary tenderness  ENT/Mouth: Ext aud canals clear, normal TM bilaterally. Good dentition. No erythema, swelling, or exudate on post pharynx. Tonsils not swollen or erythematous. Hearing normal.  Neck: Supple, thyroid normal. No bruits  Respiratory: Respiratory effort normal, BS equal bilaterally without rales, rhonchi, wheezing or stridor.  Cardio: RRR without murmurs, rubs or gallops. Brisk peripheral pulses without edema.  Chest: symmetric, with normal excursions and percussion.  Breasts: breasts appear normal, no suspicious masses, no skin or nipple changes or axillary nodes. Abdomen: Soft, obese, nontender, no guarding, rebound, hernias, masses, or organomegaly.  Lymphatics: Non tender without lymphadenopathy.  Genitourinary: Defer to GYN, no concerns Musculoskeletal: Full ROM all peripheral extremities,5/5 strength, and normal  gait.  Skin: Warm, dry without rashes, lesions, ecchymosis. Neuro: Cranial nerves intact, reflexes equal bilaterally. Normal muscle tone, no cerebellar symptoms. Sensation intact.  Psych: Awake and oriented X 3, normal affect, Insight and Judgment appropriate.    EKG: Sinus tachycardia  Izora Ribas, NP 10:21 AM [facility]

## 2022-01-22 ENCOUNTER — Other Ambulatory Visit (HOSPITAL_COMMUNITY): Payer: Self-pay

## 2022-01-22 ENCOUNTER — Other Ambulatory Visit: Payer: Self-pay

## 2022-01-22 ENCOUNTER — Ambulatory Visit (INDEPENDENT_AMBULATORY_CARE_PROVIDER_SITE_OTHER): Payer: BC Managed Care – PPO | Admitting: Adult Health

## 2022-01-22 ENCOUNTER — Encounter: Payer: Self-pay | Admitting: Adult Health

## 2022-01-22 VITALS — BP 110/70 | HR 107 | Temp 97.9°F | Ht 65.5 in | Wt 225.6 lb

## 2022-01-22 DIAGNOSIS — Z9189 Other specified personal risk factors, not elsewhere classified: Secondary | ICD-10-CM

## 2022-01-22 DIAGNOSIS — E66811 Obesity, class 1: Secondary | ICD-10-CM

## 2022-01-22 DIAGNOSIS — Z1389 Encounter for screening for other disorder: Secondary | ICD-10-CM

## 2022-01-22 DIAGNOSIS — F17200 Nicotine dependence, unspecified, uncomplicated: Secondary | ICD-10-CM

## 2022-01-22 DIAGNOSIS — E669 Obesity, unspecified: Secondary | ICD-10-CM

## 2022-01-22 DIAGNOSIS — Z79899 Other long term (current) drug therapy: Secondary | ICD-10-CM | POA: Diagnosis not present

## 2022-01-22 DIAGNOSIS — Z13220 Encounter for screening for lipoid disorders: Secondary | ICD-10-CM

## 2022-01-22 DIAGNOSIS — F988 Other specified behavioral and emotional disorders with onset usually occurring in childhood and adolescence: Secondary | ICD-10-CM

## 2022-01-22 DIAGNOSIS — Z0001 Encounter for general adult medical examination with abnormal findings: Secondary | ICD-10-CM

## 2022-01-22 DIAGNOSIS — Z0000 Encounter for general adult medical examination without abnormal findings: Secondary | ICD-10-CM | POA: Diagnosis not present

## 2022-01-22 DIAGNOSIS — Z1211 Encounter for screening for malignant neoplasm of colon: Secondary | ICD-10-CM

## 2022-01-22 DIAGNOSIS — R7989 Other specified abnormal findings of blood chemistry: Secondary | ICD-10-CM

## 2022-01-22 DIAGNOSIS — Z131 Encounter for screening for diabetes mellitus: Secondary | ICD-10-CM | POA: Diagnosis not present

## 2022-01-22 DIAGNOSIS — Z1329 Encounter for screening for other suspected endocrine disorder: Secondary | ICD-10-CM

## 2022-01-22 DIAGNOSIS — I10 Essential (primary) hypertension: Secondary | ICD-10-CM | POA: Diagnosis not present

## 2022-01-22 DIAGNOSIS — K219 Gastro-esophageal reflux disease without esophagitis: Secondary | ICD-10-CM

## 2022-01-22 DIAGNOSIS — Z136 Encounter for screening for cardiovascular disorders: Secondary | ICD-10-CM

## 2022-01-22 DIAGNOSIS — Z130 Encounter for screening for diseases of the blood and blood-forming organs and certain disorders involving the immune mechanism: Secondary | ICD-10-CM

## 2022-01-22 DIAGNOSIS — F319 Bipolar disorder, unspecified: Secondary | ICD-10-CM

## 2022-01-22 DIAGNOSIS — E559 Vitamin D deficiency, unspecified: Secondary | ICD-10-CM | POA: Diagnosis not present

## 2022-01-22 DIAGNOSIS — E538 Deficiency of other specified B group vitamins: Secondary | ICD-10-CM

## 2022-01-22 DIAGNOSIS — Z1231 Encounter for screening mammogram for malignant neoplasm of breast: Secondary | ICD-10-CM

## 2022-01-22 MED ORDER — MOUNJARO 15 MG/0.5ML ~~LOC~~ SOAJ: 15.0000 mg | SUBCUTANEOUS | 2 refills | Status: DC

## 2022-01-22 MED ORDER — FAMOTIDINE 40 MG PO TABS: ORAL_TABLET | ORAL | 1 refills | Status: DC

## 2022-01-22 MED ORDER — MOUNJARO 15 MG/0.5ML ~~LOC~~ SOAJ
15.0000 mg | SUBCUTANEOUS | 2 refills | Status: DC
  Filled 2022-01-22: qty 2, 28d supply, fill #0

## 2022-01-22 NOTE — Patient Instructions (Addendum)
Angelica Weeks , Thank you for taking time to come for your Annual Wellness Visit. I appreciate your ongoing commitment to your health goals. Please review the following plan we discussed and let me know if I can assist you in the future.   This is a list of the screening recommended for you and due dates:  Health Maintenance  Topic Date Due   Mammogram  Never done   Colon Cancer Screening  Never done   Pap Smear  12/17/2021   COVID-19 Vaccine (3 - Pfizer risk series) 02/07/2022*   Flu Shot  03/14/2022*   Tetanus Vaccine  02/12/2025   HPV Vaccine  Aged Out   Hepatitis C Screening: USPSTF Recommendation to screen - Ages 18-79 yo.  Discontinued   HIV Screening  Discontinued  *Topic was postponed. The date shown is not the original due date.    Please schedule with GYN Dr. Valentina Shaggy ASAP -  [phone]    HOW TO SCHEDULE A MAMMOGRAM  [facility]  7 a.m.-6:30 p.m., Monday 7 a.m.-5 p.m., Tuesday-Friday Schedule an appointment by calling [phone].     Know what a healthy weight is for you (roughly BMI <25) and aim to maintain this  Aim for 7+ servings of fruits and vegetables daily  65-80+ fluid ounces of water or unsweet tea for healthy kidneys  Limit to max 1 drink of alcohol per day; avoid smoking/tobacco  Limit animal fats in diet for cholesterol and heart health - choose grass fed whenever available  Avoid highly processed foods, and foods high in saturated/trans fats  Aim for low stress - take time to unwind and care for your mental health  Aim for 150 min of moderate intensity exercise weekly for heart health, and weights twice weekly for bone health  Aim for 7-9 hours of sleep daily    Protein Content in Foods Protein is a necessary nutrient in any diet. It helps build and repair muscles, bones, and skin. Depending on your overall health, you may need more or less protein in your diet. You are encouraged to eat a variety of  protein foods to ensure that you get all the essential nutrients that are found in different protein foods. Talk with your health care provider or dietitian about how much protein you need each day and which sources of protein are best for you. Protein is especially important for: Repairing and making cells and tissues. Fighting infection. Providing energy. Growth and development. See the following list for the protein content of some common foods. What are tips for getting more protein in your diet? Try to replace processed carbohydrates with high-quality protein. Snack on nuts and seeds instead of chips. Replace baked desserts with [place] yogurt. Eat protein foods from both plant and animal sources. Replace red meat with seafood. Add beans and peas to salads, soups, and side dishes. Include a protein food with each meal and snack. Reading food labels You can find the amount of protein in a food item by looking at the nutrition facts label. Use the total grams listed to help you reach your daily goal. What foods are high in protein? High-protein foods contain 4 grams (g) or more of protein per serving. They include: Grains Quinoa (cooked) -- 1 cup (185 g) has 8 g of protein. Whole wheat pasta (cooked) -- 1 cup (140 g) has 6 g of protein. Meat Beef, ground sirloin (cooked) -- 3 oz (85 g) has 24 g of protein.  Chicken breast, boneless and skinless (cooked) -- 3 oz (85 g) has 25 g of protein. Egg -- 1 egg has 6 g of protein. Fish, filet (cooked) -- 1 oz (28 g) has 6-7 g of protein. Lamb (cooked) -- 3 oz (85 g) has 24 g of protein. Pork tenderloin (cooked) -- 3 oz (85 g) has 23 g of protein. Tuna (canned in water) -- 3 oz (85 g) has 20 g of protein. Dairy Cottage cheese --  cup (114 g) has 13.4 g of protein. Milk -- 1 cup (237 mL) has 8 g of protein. Cheese (hard) -- 1 oz (28 g) has 7 g of protein. Yogurt, regular -- 6 oz (170 g) has 8 g of protein. Greek yogurt -- 6 oz (200 g) has 18  g protein. Plant protein Garbanzo beans (canned or cooked) --  cup (130 g) has 6-7 g of protein. Kidney beans (canned or cooked) --  cup (130 g) has 6-7 g of protein. Nuts (peanuts, pistachios, almonds) -- 1 oz (28 g) has 6 g of protein. Peanut butter -- 1 oz (32 g) has 7-8 g of protein. Pumpkin seeds -- 1 oz (28 g) has 8.5 g of protein. Soybeans (roasted) -- 1 oz (28 g) has 8 g of protein. Soybeans (cooked) --  cup (90 g) has 11 g of protein. Soy milk -- 1 cup (250 mL) has 5-10 g of protein. Soy or vegetable patty -- 1 patty has 11 g of protein. Sunflower seeds -- 1 oz (28 g) has 5.5 g of protein. Buckwheat -- 1 oz (33 g) has 4.3 g of protein. Tofu (firm) --  cup (124 g) has 20 g of protein. Tempeh --  cup (83 g) has 16 g of protein. The items listed above may not be a complete list of foods high in protein. Actual amounts of protein may differ depending on processing. Contact a dietitian for more information. What foods are low in protein? Low-protein foods contain 3 grams (g) or less of protein per serving. They include: Fruits Fruit or vegetable juice --  cup (125 mL) has 1 g of protein. Vegetables Beets (raw or cooked) --  cup (68 g) has 1.5 g of protein. Broccoli (raw or cooked) --  cup (44 g) has 2 g of protein. Collard greens (raw or cooked) --  cup (42 g) has 2 g of protein. Green beans (raw or cooked) --  cup (83 g) has 1 g of protein. Green peas (canned) --  cup (80 g) has 3.5 g of protein. Potato (baked with skin) -- 1 medium potato (173 g) has 3 g of protein. Spinach (cooked) --  cup (90 g) has 3 g of protein. Squash (cooked) --  cup (90 g) has 1.5 g of protein. Avocado -- 1 cup (146 g) has 2.7 g of protein. Grains Bran cereal --  cup (30 g) has 2-3 g of protein. Bread -- 1 slice has 2.5 g of protein. Corn (fresh or cooked) --  cup (77 g) has 2 g of protein. Flour tortilla -- One 6-inch (15 cm) tortilla has 2.5 g of protein. Muffins -- 1 small muffin (2  oz or 57 g) has 3 g of protein. Oatmeal (cooked) --  cup (40 g) has 3 g of protein. Rice (cooked) --  cup (79 g) has 2.5-3.5 g of protein. Dairy Cream cheese -- 1 oz (29 g) has 2 g of protein. Creamer (half-and-half) -- 1 oz (29 mL) has 1 g  of protein. Frozen yogurt --  cup (72 g) has 3 g of protein. Sour cream --  cup (75 g) has 2.5 g of protein. The items listed above may not be a complete list of foods low in protein. Actual amounts of protein may differ depending on processing. Contact a dietitian for more information. Summary Protein is a nutrient that your body needs for growth and development, repairing and making cells and tissues, fighting infection, and providing energy. Protein is in both plant and animal foods. Some of these foods have more protein than others. Depending on your overall health, you may need more or less protein in your diet. Talk to your health care provider about how much protein you need. This information is not intended to replace advice given to you by your health care provider. Make sure you discuss any questions you have with your health care provider. Document Revised: 11/05/2020 Document Reviewed: 11/05/2020 Elsevier Patient Education  2022 Elsevier Inc.       SMOKING CESSATION  [employer] cancer society  66294765465 for more information or for a free program for smoking cessation help.   You can call QUIT SMART 1-800-QUIT-NOW for free nicotine patches or replacement therapy- if they are out- keep calling  [facility] Can call for smoking cessation classes, [phone]  If you have a smart phone, please look up Smoke Free app, this will help you stay on track and give you information about money you have saved, life that you have gained back and a ton of more information.     ADVANTAGES OF QUITTING SMOKING Within 20 minutes, blood pressure decreases. Your pulse is at normal level. After 8 hours, carbon monoxide levels in  the blood return to normal. Your oxygen level increases. After 24 hours, the chance of having a heart attack starts to decrease. Your breath, hair, and body stop smelling like smoke. After 48 hours, damaged nerve endings begin to recover. Your sense of taste and smell improve. After 72 hours, the body is virtually free of nicotine. Your bronchial tubes relax and breathing becomes easier. After 2 to 12 weeks, lungs can hold more air. Exercise becomes easier and circulation improves. After 1 year, the risk of coronary heart disease is cut in half. After 5 years, the risk of stroke falls to the same as a nonsmoker. After 10 years, the risk of lung cancer is cut in half and the risk of other cancers decreases significantly. After 15 years, the risk of coronary heart disease drops, usually to the level of a nonsmoker. You will have extra money to spend on things other than cigarettes.

## 2022-01-23 ENCOUNTER — Encounter: Payer: Self-pay | Admitting: Adult Health

## 2022-01-23 ENCOUNTER — Other Ambulatory Visit: Payer: Self-pay | Admitting: Adult Health

## 2022-01-23 DIAGNOSIS — E88819 Insulin resistance, unspecified: Secondary | ICD-10-CM | POA: Insufficient documentation

## 2022-01-23 DIAGNOSIS — E8881 Metabolic syndrome: Secondary | ICD-10-CM | POA: Insufficient documentation

## 2022-01-23 DIAGNOSIS — R8290 Unspecified abnormal findings in urine: Secondary | ICD-10-CM

## 2022-01-23 LAB — LIPID PANEL
Cholesterol: 148 mg/dL (ref <200)
HDL: 66 mg/dL (ref >=50)
LDL Cholesterol (Calc): 65 mg/dL (calc)
Non-HDL Cholesterol (Calc): 82 mg/dL (calc) (ref <130)
Total CHOL/HDL Ratio: 2.2 (calc) (ref <5.0)
Triglycerides: 85 mg/dL (ref <150)

## 2022-01-23 LAB — URINALYSIS, ROUTINE W REFLEX MICROSCOPIC
Bilirubin Urine: NEGATIVE
Glucose, UA: NEGATIVE
Hgb urine dipstick: NEGATIVE
Hyaline Cast: NONE SEEN /LPF
Ketones, ur: NEGATIVE
Leukocytes,Ua: NEGATIVE
Nitrite: POSITIVE — AB
Specific Gravity, Urine: 1.026 (ref 1.001–1.035)
WBC, UA: NONE SEEN /HPF (ref 0–5)
pH: 6 (ref 5.0–8.0)

## 2022-01-23 LAB — CBC WITH DIFFERENTIAL/PLATELET
Absolute Monocytes: 558 cells/uL (ref 200–950)
Basophils Absolute: 61 cells/uL (ref 0–200)
Basophils Relative: 0.9 %
Eosinophils Absolute: 122 cells/uL (ref 15–500)
Eosinophils Relative: 1.8 %
HCT: 43.2 % (ref 35.0–45.0)
Hemoglobin: 14.4 g/dL (ref 11.7–15.5)
Lymphs Abs: 2040 cells/uL (ref 850–3900)
MCH: 30.6 pg (ref 27.0–33.0)
MCHC: 33.3 g/dL (ref 32.0–36.0)
MCV: 91.7 fL (ref 80.0–100.0)
MPV: 8.5 fL (ref 7.5–12.5)
Monocytes Relative: 8.2 %
Neutro Abs: 4019 cells/uL (ref 1500–7800)
Neutrophils Relative %: 59.1 %
Platelets: 329 10*3/uL (ref 140–400)
RBC: 4.71 10*6/uL (ref 3.80–5.10)
RDW: 13 % (ref 11.0–15.0)
Total Lymphocyte: 30 %
WBC: 6.8 10*3/uL (ref 3.8–10.8)

## 2022-01-23 LAB — COMPLETE METABOLIC PANEL WITH GFR
AG Ratio: 1.7 (calc) (ref 1.0–2.5)
ALT: 15 U/L (ref 6–29)
AST: 13 U/L (ref 10–35)
Albumin: 4.2 g/dL (ref 3.6–5.1)
Alkaline phosphatase (APISO): 112 U/L (ref 31–125)
BUN: 12 mg/dL (ref 7–25)
CO2: 29 mmol/L (ref 20–32)
Calcium: 9.7 mg/dL (ref 8.6–10.2)
Chloride: 102 mmol/L (ref 98–110)
Creat: 0.95 mg/dL (ref 0.50–0.99)
Globulin: 2.5 g/dL (calc) (ref 1.9–3.7)
Glucose, Bld: 84 mg/dL (ref 65–99)
Potassium: 4.8 mmol/L (ref 3.5–5.3)
Sodium: 139 mmol/L (ref 135–146)
Total Bilirubin: 0.3 mg/dL (ref 0.2–1.2)
Total Protein: 6.7 g/dL (ref 6.1–8.1)
eGFR: 74 mL/min/{1.73_m2} (ref >=60)

## 2022-01-23 LAB — MICROALBUMIN / CREATININE URINE RATIO
Creatinine, Urine: 216 mg/dL (ref 20–275)
Microalb Creat Ratio: 6 mcg/mg creat (ref <30)
Microalb, Ur: 1.3 mg/dL

## 2022-01-23 LAB — HEMOGLOBIN A1C
Hgb A1c MFr Bld: 5 % of total Hgb (ref <5.7)
Mean Plasma Glucose: 97 mg/dL
eAG (mmol/L): 5.4 mmol/L

## 2022-01-23 LAB — MICROSCOPIC MESSAGE

## 2022-01-23 LAB — MAGNESIUM: Magnesium: 2.2 mg/dL (ref 1.5–2.5)

## 2022-01-23 LAB — TSH: TSH: 1.7 mIU/L

## 2022-01-23 LAB — VITAMIN D 25 HYDROXY (VIT D DEFICIENCY, FRACTURES): Vit D, 25-Hydroxy: 93 ng/mL (ref 30–100)

## 2022-01-23 LAB — INSULIN, RANDOM: Insulin: 20.8 u[IU]/mL — ABNORMAL HIGH

## 2022-01-23 LAB — VITAMIN B12: Vitamin B-12: >2000 pg/mL — ABNORMAL HIGH (ref 200–1100)

## 2022-01-23 MED ORDER — CYANOCOBALAMIN 5000 MCG SL SUBL: 1.0000 | SUBLINGUAL_TABLET | SUBLINGUAL | Status: DC

## 2022-01-23 MED ORDER — MOUNJARO 15 MG/0.5ML ~~LOC~~ SOAJ
15.0000 mg | SUBCUTANEOUS | 2 refills | Status: DC
Start: 2022-01-23 — End: 2022-06-30
  Filled 2022-01-23 – 2022-01-28 (×2): qty 2, 28d supply, fill #0
  Filled 2022-02-28: qty 2, 28d supply, fill #1
  Filled 2022-04-09: qty 2, 28d supply, fill #2

## 2022-01-24 ENCOUNTER — Other Ambulatory Visit (HOSPITAL_COMMUNITY): Payer: Self-pay

## 2022-01-25 LAB — URINE CULTURE
MICRO NUMBER:: 12987943
SPECIMEN QUALITY:: ADEQUATE

## 2022-01-28 ENCOUNTER — Other Ambulatory Visit (HOSPITAL_COMMUNITY): Payer: Self-pay

## 2022-01-28 ENCOUNTER — Other Ambulatory Visit: Payer: Self-pay | Admitting: Adult Health

## 2022-01-28 MED ORDER — NITROFURANTOIN MONOHYD MACRO 100 MG PO CAPS
100.0000 mg | ORAL_CAPSULE | Freq: Two times a day (BID) | ORAL | 0 refills | Status: DC
Start: 2022-01-28 — End: 2022-04-30

## 2022-02-07 ENCOUNTER — Telehealth: Payer: Self-pay

## 2022-02-07 NOTE — Telephone Encounter (Signed)
Prior Authorization for [facility] [facility] denied.

## 2022-02-19 DIAGNOSIS — F3132 Bipolar disorder, current episode depressed, moderate: Secondary | ICD-10-CM | POA: Diagnosis not present

## 2022-02-19 DIAGNOSIS — F900 Attention-deficit hyperactivity disorder, predominantly inattentive type: Secondary | ICD-10-CM | POA: Diagnosis not present

## 2022-02-19 DIAGNOSIS — F411 Generalized anxiety disorder: Secondary | ICD-10-CM | POA: Diagnosis not present

## 2022-02-28 ENCOUNTER — Other Ambulatory Visit (HOSPITAL_COMMUNITY): Payer: Self-pay

## 2022-03-06 DIAGNOSIS — H04123 Dry eye syndrome of bilateral lacrimal glands: Secondary | ICD-10-CM | POA: Diagnosis not present

## 2022-03-06 DIAGNOSIS — H532 Diplopia: Secondary | ICD-10-CM | POA: Diagnosis not present

## 2022-03-18 DIAGNOSIS — F331 Major depressive disorder, recurrent, moderate: Secondary | ICD-10-CM | POA: Diagnosis not present

## 2022-03-18 DIAGNOSIS — Z79899 Other long term (current) drug therapy: Secondary | ICD-10-CM | POA: Diagnosis not present

## 2022-03-18 DIAGNOSIS — F419 Anxiety disorder, unspecified: Secondary | ICD-10-CM | POA: Diagnosis not present

## 2022-03-18 DIAGNOSIS — F902 Attention-deficit hyperactivity disorder, combined type: Secondary | ICD-10-CM | POA: Diagnosis not present

## 2022-03-20 ENCOUNTER — Ambulatory Visit: Payer: BLUE CROSS/BLUE SHIELD

## 2022-03-20 ENCOUNTER — Ambulatory Visit
Admission: RE | Admit: 2022-03-20 | Discharge: 2022-03-20 | Disposition: A | Discharge status: Home / Self Care | Payer: BC Managed Care – PPO | Source: Ambulatory Visit | Attending: Adult Health | Admitting: Adult Health

## 2022-03-20 DIAGNOSIS — Z0001 Encounter for general adult medical examination with abnormal findings: Secondary | ICD-10-CM

## 2022-03-20 DIAGNOSIS — F902 Attention-deficit hyperactivity disorder, combined type: Secondary | ICD-10-CM | POA: Diagnosis not present

## 2022-03-20 DIAGNOSIS — Z1231 Encounter for screening mammogram for malignant neoplasm of breast: Secondary | ICD-10-CM

## 2022-03-20 DIAGNOSIS — Z79899 Other long term (current) drug therapy: Secondary | ICD-10-CM | POA: Diagnosis not present

## 2022-03-24 ENCOUNTER — Ambulatory Visit: Payer: BC Managed Care – PPO | Admitting: Neurology

## 2022-03-24 ENCOUNTER — Encounter: Payer: Self-pay | Admitting: Neurology

## 2022-03-24 VITALS — BP 136/79 | HR 69 | Ht 65.5 in | Wt 215.0 lb

## 2022-03-24 DIAGNOSIS — E8881 Metabolic syndrome: Secondary | ICD-10-CM | POA: Insufficient documentation

## 2022-03-24 DIAGNOSIS — H532 Diplopia: Secondary | ICD-10-CM | POA: Diagnosis not present

## 2022-03-24 DIAGNOSIS — G479 Sleep disorder, unspecified: Secondary | ICD-10-CM | POA: Insufficient documentation

## 2022-03-24 DIAGNOSIS — R5383 Other fatigue: Secondary | ICD-10-CM | POA: Insufficient documentation

## 2022-03-24 DIAGNOSIS — Z780 Asymptomatic menopausal state: Secondary | ICD-10-CM | POA: Insufficient documentation

## 2022-03-24 DIAGNOSIS — F331 Major depressive disorder, recurrent, moderate: Secondary | ICD-10-CM | POA: Insufficient documentation

## 2022-03-24 NOTE — Patient Instructions (Addendum)
MRI Brain with and without contrast  ?I will contact you to go over the result ?Follow-up with your doctor ? ? ?

## 2022-03-24 NOTE — Progress Notes (Signed)
? ?[facility] ? ?PATIENT: Angelica Weeks ?DOB: 10-Nov-1971 ? ?REQUESTING CLINICIAN: Manning Charity, OD ?HISTORY FROM: Patient  ?REASON FOR VISIT: Intermittent diplopia  ? ? ?HISTORICAL ? ?CHIEF COMPLAINT:  ?Chief Complaint  ?Patient presents with  ? New Patient (Initial Visit)  ?  Rm 15. Alone. ?NP/Paper/[facility]/Jason Gould OD/diplopia.  ? ? ?HISTORY OF PRESENT ILLNESS:  ?This is a 51 year old woman past medical history depression, ADHD and obesity who is presenting with intermittent diplopia for the past month and a half.  Patient reports episode of diplopia, mostly early in the morning lasting 15 minutes to 2 hours.  She described it as seeing images side-by-side, she will have to close her left eye for image to be normal.  She denies any headaches, denies any recent trauma, denies any focal weakness, no associated symptoms.   ?She does report a history of snoring, but has never been evaluated for sleep apnea.  She did follow-up with ophthalmology Dr. Emily Filbert and was told that everything was normal, they did not see any primary ophthalmologic condition that can cause the double vision. Last episode was last week. ? ?OTHER MEDICAL CONDITIONS: Depression, ADHD,  ? ? ?REVIEW OF SYSTEMS: Full 14 system review of systems performed and negative with exception of: as noted in the HPI.  ? ?ALLERGIES: ?Allergies  ?Allergen Reactions  ? Aspirin Other (See Comments)  ?  Burns stomach  ? ? ?HOME MEDICATIONS: ?Outpatient Medications Prior to Visit  ?Medication Sig Dispense Refill  ? ALPRAZolam (XANAX) 1 MG tablet Take 1 mg by mouth at bedtime as needed for anxiety.    ? amphetamine-dextroamphetamine (ADDERALL XR) 20 MG 24 hr capsule Take 1 capsule (20 mg total) by mouth daily in the evening 30 capsule 0  ? amphetamine-dextroamphetamine (ADDERALL XR) 30 MG 24 hr capsule Take 1 capsule (30 mg total) by mouth daily. Takes in the morning 30 capsule 0  ? Cyanocobalamin 5000 MCG SUBL Place 1 tablet (5,000  mcg total) under the tongue once a week.    ? cyclobenzaprine (FLEXERIL) 10 MG tablet TAKE 1/2 TO 1 TABLET 2 TO 3 X /DAY IF NEEDED FOR MUSCLE SPASMS 90 tablet 0  ? famotidine (PEPCID) 40 MG tablet Take 1 tab prior to dinner for reflux. 90 tablet 1  ? lamoTRIgine (LAMICTAL) 200 MG tablet Take 2 tablets (400 mg total) by mouth at bedtime.    ? nitrofurantoin, macrocrystal-monohydrate, (MACROBID) 100 MG capsule Take 1 capsule (100 mg total) by mouth 2 (two) times daily. 10 capsule 0  ? ondansetron (ZOFRAN) 4 MG tablet TAKE 1 TABLET 3 TIMES DAILY IF NEEDED FOR NAUSEA 30 tablet 0  ? tirzepatide (MOUNJARO) 15 MG/0.5ML Pen Inject 15 mg into the skin once a week. 2 mL 2  ? traZODone (DESYREL) 100 MG tablet Take 100 mg by mouth. Takes 200 mg at bedtime.    ? VITAMIN D PO Take 10,000 Units by mouth daily.    ? VRAYLAR 6 MG CAPS TAKE 1 CAPSULE BY MOUTH DAILY 30 capsule 3  ? ?No facility-administered medications prior to visit.  ? ? ?PAST MEDICAL HISTORY: ?Past Medical History:  ?Diagnosis Date  ? ADD (attention deficit disorder)   ? Bipolar 1 disorder, depressed (HCC)   ? Cannabis abuse with psychotic disorder with delusions (HCC) 05/22/2018  ? Cellulitis of left eyelid 01/2014  ? Depression   ? Dysmenorrhea   ? Paranoid delusion (HCC) 05/22/2018  ? ? ?PAST SURGICAL HISTORY: ?Past Surgical History:  ?Procedure Laterality Date  ?  WISDOM TOOTH EXTRACTION    ? ? ?FAMILY HISTORY: ?Family History  ?Adopted: Yes  ?Family history unknown: Yes  ? ? ?SOCIAL HISTORY: ?Social History  ? ?Socioeconomic History  ? Marital status: Divorced  ?  Spouse name: Aliene BeamsJohn Meyer, BF of 15 yea  ? Number of children: 2  ? Years of education: Associate's Degree in Accounting  ? Highest education level: Associate degree: academic program  ?Occupational History  ? Occupation: unemployed  ?Tobacco Use  ? Smoking status: Every Day  ?  Packs/day: 1.00  ?  Years: 32.00  ?  Pack years: 32.00  ?  Types: Cigarettes  ?  Start date: 611990  ? Smokeless tobacco: Never   ?Vaping Use  ? Vaping Use: Never used  ?Substance and Sexual Activity  ? Alcohol use: Not Currently  ?  Comment: Last in 2021.  ? Drug use: Not Currently  ?  Types: Marijuana  ?  Comment: last in 2021, none since  ? Sexual activity: Yes  ?  Birth control/protection: Post-menopausal  ?Other Topics Concern  ? Not on file  ?Social History Narrative  ? Not on file  ? ?Social Determinants of Health  ? ?Financial Resource Strain: Not on file  ?Food Insecurity: Not on file  ?Transportation Needs: Not on file  ?Physical Activity: Not on file  ?Stress: Not on file  ?Social Connections: Not on file  ?Intimate Partner Violence: Not on file  ? ? ?PHYSICAL EXAM ? ?GENERAL EXAM/CONSTITUTIONAL: ?Vitals:  ?Vitals:  ? 03/24/22 0946  ?BP: 136/79  ?Pulse: 69  ?Weight: 215 lb (97.5 kg)  ?Height: 5' 5.5" (1.664 m)  ? ?Body mass index is 35.23 kg/m?. ?Wt Readings from Last 3 Encounters:  ?03/24/22 215 lb (97.5 kg)  ?01/22/22 225 lb 9.6 oz (102.3 kg)  ?01/01/22 226 lb 3.2 oz (102.6 kg)  ? ?Patient is in no distress; well developed, nourished and groomed; neck is supple ? ?EYES: ?Pupils round and reactive to light, Visual fields full to confrontation, Extraocular movements intacts,  ? ?MUSCULOSKELETAL: ?Gait, strength, tone, movements noted in Neurologic exam below ? ?NEUROLOGIC: ?MENTAL STATUS:  ?   ? View : No data to display.  ?  ?  ?  ? ?awake, alert, oriented to person, place and time ?recent and remote memory intact ?normal attention and concentration ?language fluent, comprehension intact, naming intact ?fund of knowledge appropriate ? ?CRANIAL NERVE:  ?2nd, 3rd, 4th, 6th - pupils equal and reactive to light, visual fields full to confrontation, extraocular muscles intact, no nystagmus ?5th - facial sensation symmetric ?7th - facial strength symmetric ?8th - hearing intact ?9th - palate elevates symmetrically, uvula midline ?11th - shoulder shrug symmetric ?12th - tongue protrusion midline ? ?MOTOR:  ?normal bulk and tone, full  strength in the BUE, BLE ? ?SENSORY:  ?normal and symmetric to light touch, pinprick, temperature, vibration ? ?COORDINATION:  ?finger-nose-finger, fine finger movements normal ? ?REFLEXES:  ?deep tendon reflexes present and symmetric ? ?GAIT/STATION:  ?normal ? ? ?DIAGNOSTIC DATA (LABS, IMAGING, TESTING) ?- I reviewed patient records, labs, notes, testing and imaging myself where available. ? ?Lab Results  ?Component Value Date  ? WBC 6.8 01/22/2022  ? HGB 14.4 01/22/2022  ? HCT 43.2 01/22/2022  ? MCV 91.7 01/22/2022  ? PLT 329 01/22/2022  ? ?   ?Component Value Date/Time  ? NA 139 01/22/2022 0956  ? K 4.8 01/22/2022 0956  ? CL 102 01/22/2022 0956  ? CO2 29 01/22/2022 0956  ? GLUCOSE 84 01/22/2022  0355  ? BUN 12 01/22/2022 0956  ? CREATININE 0.95 01/22/2022 0956  ? CALCIUM 9.7 01/22/2022 0956  ? PROT 6.7 01/22/2022 0956  ? ALBUMIN 4.6 05/21/2018 1321  ? AST 13 01/22/2022 0956  ? ALT 15 01/22/2022 0956  ? ALKPHOS 75 05/21/2018 1321  ? BILITOT 0.3 01/22/2022 0956  ? GFRNONAA 73 01/22/2021 0951  ? GFRAA 85 01/22/2021 0951  ? ?Lab Results  ?Component Value Date  ? CHOL 148 01/22/2022  ? HDL 66 01/22/2022  ? LDLCALC 65 01/22/2022  ? TRIG 85 01/22/2022  ? CHOLHDL 2.2 01/22/2022  ? ?Lab Results  ?Component Value Date  ? HGBA1C 5.0 01/22/2022  ? ?Lab Results  ?Component Value Date  ? VITAMINB12 >2,000 (H) 01/22/2022  ? ?Lab Results  ?Component Value Date  ? TSH 1.70 01/22/2022  ? ? ?MRI Brain with and without contrast 01/15/2019 ?Negative exam.  No acute or focal intracranial abnormality. ?Study was tailored for examination of the pituitary, and no abnormality was seen. Specifically no evidence for prolactinoma, cavernous sinus lesion, or parasellar/suprasellar mass. ? ? ? ?ASSESSMENT AND PLAN ? ?51 y.o. year old female with obesity, ADHD and anxiety depression who is presenting with intermittent diplopia, described as seeing images side-by-side for the past month and a half.  Patient reports episode last 15 minutes to 2  hours and they usually occur in the morning.  Her neurological exam is normal, no abnormalities noted including detailed eye examination.  I have explained to the patient that I will proceed with obtaining a MR

## 2022-03-25 ENCOUNTER — Telehealth: Payer: Self-pay | Admitting: Neurology

## 2022-03-25 NOTE — Telephone Encounter (Signed)
[employer] approval #[id number] (03/25/2022-04/23/2022). ?

## 2022-03-26 ENCOUNTER — Telehealth: Payer: Self-pay | Admitting: Neurology

## 2022-03-26 NOTE — Telephone Encounter (Signed)
LVM for pt to call back to schedule  [employer] approval #[id number] (03/25/2022-04/23/2022)... ?

## 2022-03-27 NOTE — Telephone Encounter (Signed)
X2 lvm  ?

## 2022-03-27 NOTE — Telephone Encounter (Addendum)
I called the patient. She has a prescription for alprazolam 1mg , prn for anxiety. Typically Dr. Krista Blue will prescribe the following: ? ?Take 1-2 tablets thirty minutes prior to MRI.  May take one additional tablet before entering scanner, if needed.   ? ?She can use her home supply, once directed. She is aware she must have a driver.  ?

## 2022-03-27 NOTE — Telephone Encounter (Signed)
I spoke to the patient and reviewed Dr. Teofilo Pod directions. She verbalized understanding.  ?

## 2022-03-27 NOTE — Telephone Encounter (Signed)
I usually recommend alprazolam 0.5 to 1 mg on-call to MRI, may take an additional 0.5 mg if need be.  Since she has a 1 mg pill, she should take 1/2 to 1 pill prior to MRI and another half pill as needed. ?

## 2022-03-27 NOTE — Telephone Encounter (Signed)
Patient returned my call. She is scheduled at [facility] for 04/01/22 ? ?She informed me she is claustorphic and would like something to her, but she is wanting to know what the strength is that would be ordered because she already has a prescription for xanax. She is aware to have a driver. She said she would like a call back but to leave a voicemail on the phone would be fine.  ?

## 2022-04-01 ENCOUNTER — Other Ambulatory Visit (HOSPITAL_COMMUNITY): Payer: Self-pay

## 2022-04-01 ENCOUNTER — Ambulatory Visit: Payer: BC Managed Care – PPO

## 2022-04-01 DIAGNOSIS — H532 Diplopia: Secondary | ICD-10-CM

## 2022-04-01 MED ORDER — AMPHETAMINE-DEXTROAMPHET ER 30 MG PO CP24
30.0000 mg | ORAL_CAPSULE | Freq: Every day | ORAL | 0 refills | Status: DC
  Filled 2022-04-01: qty 30, 30d supply, fill #0

## 2022-04-01 MED ORDER — AMPHETAMINE-DEXTROAMPHET ER 20 MG PO CP24
20.0000 mg | ORAL_CAPSULE | Freq: Every day | ORAL | 0 refills | Status: DC
  Filled 2022-04-01: qty 30, 30d supply, fill #0

## 2022-04-02 DIAGNOSIS — H04123 Dry eye syndrome of bilateral lacrimal glands: Secondary | ICD-10-CM | POA: Diagnosis not present

## 2022-04-02 DIAGNOSIS — H532 Diplopia: Secondary | ICD-10-CM | POA: Diagnosis not present

## 2022-04-09 ENCOUNTER — Other Ambulatory Visit (HOSPITAL_COMMUNITY): Payer: Self-pay

## 2022-04-24 ENCOUNTER — Encounter (INDEPENDENT_AMBULATORY_CARE_PROVIDER_SITE_OTHER): Payer: Self-pay

## 2022-04-24 DIAGNOSIS — Z0289 Encounter for other administrative examinations: Secondary | ICD-10-CM

## 2022-04-29 ENCOUNTER — Other Ambulatory Visit (HOSPITAL_COMMUNITY): Payer: Self-pay

## 2022-04-29 MED ORDER — AMPHETAMINE-DEXTROAMPHET ER 20 MG PO CP24
20.0000 mg | ORAL_CAPSULE | Freq: Every day | ORAL | 0 refills | Status: DC
  Filled 2022-04-29: qty 30, 30d supply, fill #0

## 2022-04-29 MED ORDER — AMPHETAMINE-DEXTROAMPHET ER 30 MG PO CP24
30.0000 mg | ORAL_CAPSULE | Freq: Every day | ORAL | 0 refills | Status: DC
  Filled 2022-04-29: qty 30, 30d supply, fill #0

## 2022-04-30 ENCOUNTER — Other Ambulatory Visit (HOSPITAL_COMMUNITY): Payer: Self-pay

## 2022-04-30 ENCOUNTER — Encounter: Payer: Self-pay | Admitting: Adult Health

## 2022-04-30 ENCOUNTER — Ambulatory Visit (INDEPENDENT_AMBULATORY_CARE_PROVIDER_SITE_OTHER): Payer: BC Managed Care – PPO | Admitting: Adult Health

## 2022-04-30 DIAGNOSIS — K219 Gastro-esophageal reflux disease without esophagitis: Secondary | ICD-10-CM

## 2022-04-30 DIAGNOSIS — R809 Proteinuria, unspecified: Secondary | ICD-10-CM

## 2022-04-30 DIAGNOSIS — E669 Obesity, unspecified: Secondary | ICD-10-CM

## 2022-04-30 DIAGNOSIS — F319 Bipolar disorder, unspecified: Secondary | ICD-10-CM

## 2022-04-30 DIAGNOSIS — R400 Somnolence: Secondary | ICD-10-CM

## 2022-04-30 DIAGNOSIS — F17200 Nicotine dependence, unspecified, uncomplicated: Secondary | ICD-10-CM

## 2022-04-30 DIAGNOSIS — E8881 Metabolic syndrome: Secondary | ICD-10-CM

## 2022-04-30 MED ORDER — METFORMIN HCL ER 500 MG PO TB24: ORAL_TABLET | ORAL | 3 refills | Status: DC

## 2022-04-30 MED ORDER — PANTOPRAZOLE SODIUM 40 MG PO TBEC: 40.0000 mg | DELAYED_RELEASE_TABLET | Freq: Every day | ORAL | 1 refills | Status: DC

## 2022-04-30 NOTE — Progress Notes (Signed)
3 MONTH FOLLOW UP ? ?Assessment and Plan: ? ? ?Gastroesophageal reflux disease, unspecified whether esophagitis present ?Frequent breakthrough; try famotidine 40 mg at night ?Follow up if not improving in 2 weeks ? ?Vitamin D deficiency ?Continue supplementation, goal 60-100 ? ?Bipolar 1 disorder, depressed ([place]) ?Recurrent major depressive disorder, in partial remission ([place]) ?Follows with Dr [name] ?Taking Vraylar and lamictal ?Stable on this regimen for several years per patient ?Discussed distraction: increase walking, walking dog, exercise ? ?Attention deficit disorder (ADD) without hyperactivity ?Doing well at this time ?Psych managing adderall  ? ?Smoker unmotivated to quit ?One pack a day ?Discussed smoking cessation ?Not ready to quit at this time  ?          ?Obesity - BMI 36  ?Patient frustrated not seeing benefit on mounjaro 15 mg/week with coupon that was initiated by Dr. Melford Aase. Discussed mechanism of medication.  ?Long discussion about weight loss, diet, and exercise ?She is actually now pending [facility] for aggressive lifestyle modification ?No benefit on mounjaro max dose;  ?Concern she may have been overrestricting calories; may require close tracking ?Encouraged to keep a food log ?Discussed importance of appropriate caloric intake, protein, resistance exercises ?Defer further close weight loss follow up to weight loss clinic  ? ?Metabolic syndrome/ Insulin resistance ?See weight loss plan with [facility] ?-     metFORMIN (GLUCOPHAGE-XR) 500 MG 24 hr tablet; Take 1 tab daily with largest meal of the day for 4 weeks; if tolerating well increase to twice daily with meals for insulin resistance. ? ?Daytime somnolence ?With recent weight gain; refer for sleep study  ?-     Ambulatory referral to Neurology ? ?Proteinuria, unspecified type ?-     Urinalysis w microscopic + reflex cultur ? ?Orders Placed This Encounter  ?Procedures  ? Urinalysis w microscopic + reflex  cultur  ? Ambulatory referral to Neurology  ? ? ?Discussed med's effects and SE's. Labs and tests as requested with regular follow-up as recommended. ?Over 30 minutes of interview, exam, counseling, chart review, and complex, high level critical decision making was performed this visit.  ? ?Future Appointments  ?Date Time Provider [place]  ?06/05/2022  8:40 AM Georgia Lopes, DO [facility] None  ?06/19/2022  2:40 PM Georgia Lopes, DO [facility] None  ?01/22/2023  9:00 AM Liane Comber, NP [facility] None  ? ? ? ?HPI  ?51 y.o. female  presents for 3 month follow up. She has ADD (attention deficit disorder); Bipolar 1 disorder, depressed ([place]); GERD (gastroesophageal reflux disease); Smoker unmotivated to quit; Obesity (BMI 30-39.9); Elevated prolactin level; Insulin resistance; Fatigue; Menopause; Metabolic syndrome; Moderate recurrent major depression ([place]); Morbid obesity ([place]); and Sleep disturbance on their problem list.. ? ?She has  been having GERD, not controlled by tums, ranitidine worked well prior to being taken off the market. Famotidine 40 mg was prescribed but patient states insurance wouldn't cover, cost provibitive, still with frequent breakthrough.  ? ?Today she is concerned about ongoing daytime somnolence, very sleepy and fatigued despite 8-10 hours of sleep. Reports remote hx of negative sleep study (not in system, can't recall when/where) but note recent significant weight gain.  ? ?She is a current smoker and smokes a pack a day since 1990, thinking about about reducing but not ready, fear of weight gain and admits to habitual use. Has failed chantix and wellbutrin in the past.  ? ?She is seeing [name], MD for bipolar depression, ADD, anxiety. Bipolar currently  doing well on vraylar 6 mg and lamictal, adderall XR for ADD, and PRN xanax.  ?She is on trazodone in the evening.  ?She reports mood much improved with current regimen.  ? ?She has hx of elevated prolactin up to 25 in 12/17/2018, had  normal MRI 01/15/2019. She was recently reporting intermittent diplopia; had normal eye exam by Dr. Delman Cheadle, was referred to neuro and had normal MRI w/o on 04/01/2022, no further workup was recommended. She reports improved diplopia in the last few weeks.  ? ?BMI is Body mass index is 35.89 kg/m?., she has been working on diet and exercise. Walks with her dog, cut out alcohol, eating a lot of salad, coffee with no sugar, drinking only water. No bread, rare pasta. Doing protein focused diet - e.g. berry smoothie with greek yogurt and protein powder, etc ?She has gained about 65 lb since 2020, was previously stable around 160 lb ?Insurance doesn't cover weight loss meds, was started on mounjaro with coupon by another provider, recently on 15 mg/week, still lack of notable perceived or observed benefit.  ?Wt Readings from Last 3 Encounters:  ?04/30/22 219 lb (99.3 kg)  ?03/24/22 215 lb (97.5 kg)  ?01/22/22 225 lb 9.6 oz (102.3 kg)  ? ?Her blood pressure has been controlled at home, today their BP is BP: 112/66 ?She does workout. She denies chest pain, shortness of breath, dizziness.  ? ?She is not on cholesterol medication and denies myalgias. Her cholesterol is at goal. The cholesterol last visit was:   ?Lab Results  ?Component Value Date  ? CHOL 148 01/22/2022  ? HDL 66 01/22/2022  ? [place] 65 01/22/2022  ? TRIG 85 01/22/2022  ? CHOLHDL 2.2 01/22/2022  ? ?She has been working on diet and exercise for glucose management, she is on mounjaro 15 mg/week using coupon, she is not on bASA, she is not on ACE/ARB and denies increased appetite, nausea, paresthesia of the feet, polydipsia, polyuria, visual disturbances, vomiting and weight loss. Last A1C in the office was:  ?Lab Results  ?Component Value Date  ? HGBA1C 5.0 01/22/2022  ? ?Last GFR: ?Lab Results  ?Component Value Date  ? EGFR 74 01/22/2022  ? ?Patient is on Vitamin D supplement, taking 10000 IU ?Lab Results  ?Component Value Date  ? VD25OH 93 01/22/2022  ?   ?She is  on B12 supplement - she reduced from 5000 mcg daily to once a week after last visit ?Lab Results  ?Component Value Date  ? VITAMINB12 >2,000 (H) 01/22/2022  ? ? ? ?Current Medications:  ?Current Outpatient Medications on File Prior to Visit  ?Medication Sig Dispense Refill  ? ALPRAZolam (XANAX) 1 MG tablet Take 1 mg by mouth at bedtime as needed for anxiety.    ? amphetamine-dextroamphetamine (ADDERALL XR) 20 MG 24 hr capsule Take 1 capsule (20 mg total) by mouth daily in the evening 30 capsule 0  ? Cyanocobalamin 5000 MCG SUBL Place 1 tablet (5,000 mcg total) under the tongue once a week.    ? cyclobenzaprine (FLEXERIL) 10 MG tablet TAKE 1/2 TO 1 TABLET 2 TO 3 X /DAY IF NEEDED FOR MUSCLE SPASMS 90 tablet 0  ? lamoTRIgine (LAMICTAL) 200 MG tablet Take 2 tablets (400 mg total) by mouth at bedtime.    ? Magnesium 500 MG CAPS Take by mouth.    ? ondansetron (ZOFRAN) 4 MG tablet TAKE 1 TABLET 3 TIMES DAILY IF NEEDED FOR NAUSEA 30 tablet 0  ? tirzepatide (MOUNJARO) 15 MG/0.5ML Pen Inject  15 mg into the skin once a week. 2 mL 2  ? traZODone (DESYREL) 100 MG tablet Take 100 mg by mouth. Takes 200 mg at bedtime.    ? VITAMIN D PO Take 10,000 Units by mouth daily.    ? VRAYLAR 6 MG CAPS TAKE 1 CAPSULE BY MOUTH DAILY 30 capsule 3  ? amphetamine-dextroamphetamine (ADDERALL XR) 20 MG 24 hr capsule Take 1 capsule (20 mg total) by mouth daily. 30 capsule 0  ? famotidine (PEPCID) 40 MG tablet Take 1 tab prior to dinner for reflux. (Patient not taking: Reported on 04/30/2022) 90 tablet 1  ? ?No current facility-administered medications on file prior to visit.  ? ?Allergies:  ?Allergies  ?Allergen Reactions  ? Aspirin Other (See Comments)  ?  Burns stomach  ? ?Medical History:  ?She has ADD (attention deficit disorder); Bipolar 1 disorder, depressed ([place]); GERD (gastroesophageal reflux disease); Smoker unmotivated to quit; Obesity (BMI 30-39.9); Elevated prolactin level; Insulin resistance; Fatigue; Menopause; Metabolic syndrome;  Moderate recurrent major depression ([place]); Morbid obesity ([place]); and Sleep disturbance on their problem list. ? ?Surgical History:  ?She has a past surgical history that includes Wisdom tooth extraction.

## 2022-05-01 ENCOUNTER — Other Ambulatory Visit (HOSPITAL_COMMUNITY): Payer: Self-pay

## 2022-05-01 LAB — URINALYSIS W MICROSCOPIC + REFLEX CULTURE
Bacteria, UA: NONE SEEN /HPF
Bilirubin Urine: NEGATIVE
Glucose, UA: NEGATIVE
Hgb urine dipstick: NEGATIVE
Hyaline Cast: NONE SEEN /LPF
Ketones, ur: NEGATIVE
Leukocyte Esterase: NEGATIVE
Nitrites, Initial: NEGATIVE
Protein, ur: NEGATIVE
RBC / HPF: NONE SEEN /HPF (ref 0–2)
Specific Gravity, Urine: 1.015 (ref 1.001–1.035)
WBC, UA: NONE SEEN /HPF (ref 0–5)
pH: 7 (ref 5.0–8.0)

## 2022-05-01 LAB — NO CULTURE INDICATED

## 2022-05-02 ENCOUNTER — Other Ambulatory Visit (HOSPITAL_COMMUNITY): Payer: Self-pay

## 2022-05-02 MED ORDER — AMPHETAMINE-DEXTROAMPHET ER 30 MG PO CP24
30.0000 mg | ORAL_CAPSULE | Freq: Every day | ORAL | 0 refills | Status: DC
  Filled 2022-05-02: qty 30, 30d supply, fill #0

## 2022-05-05 ENCOUNTER — Encounter: Payer: Self-pay | Admitting: Adult Health

## 2022-05-21 ENCOUNTER — Other Ambulatory Visit: Payer: Self-pay | Admitting: Internal Medicine

## 2022-05-21 DIAGNOSIS — F900 Attention-deficit hyperactivity disorder, predominantly inattentive type: Secondary | ICD-10-CM | POA: Diagnosis not present

## 2022-05-21 DIAGNOSIS — F411 Generalized anxiety disorder: Secondary | ICD-10-CM | POA: Diagnosis not present

## 2022-05-21 DIAGNOSIS — F3132 Bipolar disorder, current episode depressed, moderate: Secondary | ICD-10-CM | POA: Diagnosis not present

## 2022-05-28 ENCOUNTER — Ambulatory Visit (INDEPENDENT_AMBULATORY_CARE_PROVIDER_SITE_OTHER): Payer: BC Managed Care – PPO | Admitting: Family Medicine

## 2022-05-28 ENCOUNTER — Encounter (INDEPENDENT_AMBULATORY_CARE_PROVIDER_SITE_OTHER): Payer: Self-pay | Admitting: Family Medicine

## 2022-05-28 VITALS — BP 112/77 | HR 101 | Temp 98.3°F | Ht 65.0 in | Wt 222.0 lb

## 2022-05-28 DIAGNOSIS — Z7984 Long term (current) use of oral hypoglycemic drugs: Secondary | ICD-10-CM

## 2022-05-28 DIAGNOSIS — F39 Unspecified mood [affective] disorder: Secondary | ICD-10-CM | POA: Insufficient documentation

## 2022-05-28 DIAGNOSIS — Z9189 Other specified personal risk factors, not elsewhere classified: Secondary | ICD-10-CM

## 2022-05-28 DIAGNOSIS — E538 Deficiency of other specified B group vitamins: Secondary | ICD-10-CM

## 2022-05-28 DIAGNOSIS — R0602 Shortness of breath: Secondary | ICD-10-CM

## 2022-05-28 DIAGNOSIS — R7301 Impaired fasting glucose: Secondary | ICD-10-CM

## 2022-05-28 DIAGNOSIS — E559 Vitamin D deficiency, unspecified: Secondary | ICD-10-CM | POA: Diagnosis not present

## 2022-05-28 DIAGNOSIS — Z6837 Body mass index (BMI) 37.0-37.9, adult: Secondary | ICD-10-CM

## 2022-05-28 DIAGNOSIS — Z1331 Encounter for screening for depression: Secondary | ICD-10-CM | POA: Diagnosis not present

## 2022-05-28 DIAGNOSIS — R5383 Other fatigue: Secondary | ICD-10-CM

## 2022-06-02 ENCOUNTER — Other Ambulatory Visit (HOSPITAL_COMMUNITY): Payer: Self-pay

## 2022-06-02 MED ORDER — AMPHETAMINE-DEXTROAMPHET ER 30 MG PO CP24
30.0000 mg | ORAL_CAPSULE | Freq: Every day | ORAL | 0 refills | Status: DC
  Filled 2022-06-02: qty 30, 30d supply, fill #0

## 2022-06-02 MED ORDER — AMPHETAMINE-DEXTROAMPHET ER 20 MG PO CP24
20.0000 mg | ORAL_CAPSULE | Freq: Every day | ORAL | 0 refills | Status: DC
  Filled 2022-06-02: qty 30, 30d supply, fill #0

## 2022-06-03 NOTE — Progress Notes (Unsigned)
Chief Complaint:   OBESITY Angelica Weeks (MR# [id number]) is a 51 y.o. female who presents for evaluation and treatment of obesity and related comorbidities. Current BMI is Body mass index is 36.94 kg/m. Angelica Weeks has been struggling with her weight for many years and has been unsuccessful in either losing weight, maintaining weight loss, or reaching her healthy weight goal.  [name] is currently in the action stage of change and ready to dedicate time achieving and maintaining a healthier weight. [name] is interested in becoming our patient and working on intensive lifestyle modifications including (but not limited to) diet and exercise for weight loss.  [name] just got a new job and endorses she may not be able to come in for OV's.  Shanece's habits were reviewed today and are as follows: she thinks her family will eat healthier with her, her desired weight loss is 72 lbs, she has been heavy most of her life, she started gaining weight last year, her heaviest weight ever was 230 pounds, she has significant food cravings issues, she snacks frequently in the evenings, she skips meals frequently, she is frequently drinking liquids with calories, she frequently makes poor food choices, she has problems with excessive hunger, and she struggles with emotional eating.  Depression Screen [name]'s Food and Mood (modified PHQ-9) score was 21.     05/28/2022    7:57 AM  Depression screen PHQ 2/9  Decreased Interest 2  Down, Depressed, Hopeless 3  PHQ - 2 Score 5  Altered sleeping 3  Tired, decreased energy 3  Change in appetite 3  Feeling bad or failure about yourself  3  Trouble concentrating 3  Moving slowly or fidgety/restless 1  Suicidal thoughts 0  PHQ-9 Score 21  Difficult doing work/chores Somewhat difficult   Subjective:   1. Other fatigue Edessa admits to daytime somnolence and admits to waking up still tired. Patient has a history of symptoms of daytime fatigue and morning  fatigue. [name] generally gets 10 hours of sleep per night, and states that she has poor sleep quality. Snoring is present. Apneic episodes are not present. Epworth Sleepiness Score is 12. Pt is going for a sleep study with Dr. Tresa Endo of cardiology in the near future.  2. SOB (shortness of breath) on exertion [name] notes increasing shortness of breath with exercising and seems to be worsening over time with weight gain. She notes getting out of breath sooner with activity than she used to. This has gotten worse recently. [name] denies shortness of breath at rest or orthopnea.  3. Mood disorder with emotional eating Pt with depression, anxiety, bipolar, and schizophrenia. She also has a history of ADD and is on Adderall. She sees Rivka [facility] of psychiatry for medication management. Dr. Deatra Robinson, PCP, manages her depression. PHQ score is 21, but pr states she is under good control. Medications: hydroxyzine, Lamictal, alprazolam, Adderall, Vraylar, Strattera, Trazodone.  4. Elevated fasting blood sugar Pt denies history of prediabetes. She states she takes Metformin because her blood sugar was elevated. Medication: Metformin  5. Vitamin D deficiency She is currently taking OTC vitamin D 1,000 IU each day. She denies nausea, vomiting or muscle weakness.  6. B12 deficiency Pt is on OTC B12 5000 mcg daily.  7. At risk for diabetes mellitus Angelica Weeks is at risk for diabetes mellitus due to being on Metformin.  Assessment/Plan:   1. Other fatigue Angelica Weeks does feel that her weight is causing her energy to be lower than  it should be. Fatigue may be related to obesity, depression or many other causes. Labs will be ordered, and in the meanwhile, [name] will focus on self care including making healthy food choices, increasing physical activity and focusing on stress reduction.Obtain fasting labs today. Educated pt about her high risk OSA and the importance of treatment for this condition.  - Hemoglobin  A1c - Lipid Panel With LDL/HDL Ratio - CBC with Differential/Platelet - Comprehensive metabolic panel  2. SOB (shortness of breath) on exertion [name] does feel that she gets out of breath more easily that she used to when she exercises. Angelica Weeks's shortness of breath appears to be obesity related and exercise induced. She has agreed to work on weight loss and gradually increase exercise to treat her exercise induced shortness of breath. Will continue to monitor closely. Obtain fasting labs today.  - CBC with Differential/Platelet - Comprehensive metabolic panel  3. Mood disorder with emotional eating Behavior modification techniques were discussed today to help Angelica Weeks deal with her emotional/non-hunger eating behaviors.  Orders and follow up as documented in patient record. Continue meds per psychiatry and PCP. Obtain fasting labs today.  - Folate - Lipid Panel With LDL/HDL Ratio - T4, free - TSH  4. Elevated fasting blood sugar Continue Metformin per PCP. Obtain fasting labs today.  - Hemoglobin A1c - Insulin, random  5. Vitamin D deficiency Low Vitamin D level contributes to fatigue and are associated with obesity, breast, and colon cancer. She agrees to continue to take OTC Vitamin D 1,000 IU daily and will follow-up for routine testing of Vitamin D, at least 2-3 times per year to avoid over-replacement. Obtain fasting labs today.  - VITAMIN D 25 Hydroxy (Vit-D Deficiency, Fractures)  6. B12 deficiency The diagnosis was reviewed with the patient. Counseling provided today, see below. We will continue to monitor. Orders and follow up as documented in patient record.  Counseling The body needs vitamin B12: to make red blood cells; to make DNA; and to help the nerves work properly so they can carry messages from the brain to the body.  The main causes of vitamin B12 deficiency include dietary deficiency, digestive diseases, pernicious anemia, and having a surgery in which part of  the stomach or small intestine is removed.  Certain medicines can make it harder for the body to absorb vitamin B12. These medicines include: heartburn medications; some antibiotics; some medications used to treat diabetes, gout, and high cholesterol.  In some cases, there are no symptoms of this condition. If the condition leads to anemia or nerve damage, various symptoms can occur, such as weakness or fatigue, shortness of breath, and numbness or tingling in your hands and feet.   Treatment:  May include taking vitamin B12 supplements.  Avoid alcohol.  Eat lots of healthy foods that contain vitamin B12: Beef, pork, chicken, [place], and organ meats, such as liver.  Seafood: This includes clams, rainbow trout, salmon, tuna, and haddock. Eggs.  Cereal and dairy products that are fortified: This means that vitamin B12 has been added to the food.  Obtain fasting labs today.  - Vitamin B12  7. Depression screening Jakiah had a positive depression screening. Depression is commonly associated with obesity and often results in emotional eating behaviors. We will monitor this closely and work on CBT to help improve the non-hunger eating patterns. Referral to Psychology may be required if no improvement is seen as she continues in our clinic.  8. At risk for diabetes mellitus - Emily was  given diabetes prevention education and counseling today of more than 9 minutes.  - Counseled patient on pathophysiology of disease and meaning/ implication of lab results.  - Reviewed how certain foods can either stimulate or inhibit insulin release, and subsequently affect hunger pathways  - Importance of following a healthy meal plan with limiting amounts of simple carbohydrates discussed with patient - Effects of regular aerobic exercise on blood sugar regulation reviewed and encouraged an eventual goal of 30 min 5d/week or more as a minimum.  - Briefly discussed treatment options, which always include dietary and  lifestyle modification as first line.   - Handouts provided at patient's desire and/or told to go online to the American Diabetes Association website for further information.  9. Class 2 severe obesity with serious comorbidity and body mass index (BMI) of 37.0 to 37.9 in adult, unspecified obesity type (HCC) Jamira is currently in the action stage of change and her goal is to continue with weight loss efforts. I recommend Gabryela begin the structured treatment plan as follows:  She has agreed to the Category 2 Plan.  Pt was brought back to lab station, but had to leave the building when Cassandra called her to be drawn.  Exercise goals:  As is    Behavioral modification strategies: increasing lean protein intake, decreasing simple carbohydrates, and planning for success.  She was informed of the importance of frequent follow-up visits to maximize her success with intensive lifestyle modifications for her multiple health conditions. She was informed we would discuss her lab results at her next visit unless there is a critical issue that needs to be addressed sooner. Nandita agreed to keep her next visit at the agreed upon time to discuss these results.  Objective:   Blood pressure 112/77, pulse (!) 101, temperature 98.3 F (36.8 C), height 5\' 5"  (1.651 m), weight 222 lb (100.7 kg), SpO2 98 %. Body mass index is 36.94 kg/m.  EKG: Normal sinus rhythm, rate 55 (done 01/22/22).  Indirect Calorimeter completed today shows a VO2 of 278 and a REE of 1915.  Her calculated basal metabolic rate is 03/22/22 thus her basal metabolic rate is better than expected.  General: Cooperative, alert, well developed, in no acute distress. HEENT: Conjunctivae and lids unremarkable. Cardiovascular: Regular rhythm.  Lungs: Normal work of breathing. Neurologic: No focal deficits.   Lab Results  Component Value Date   CREATININE 0.95 01/22/2022   BUN 12 01/22/2022   NA 139 01/22/2022   K 4.8 01/22/2022   CL 102  01/22/2022   CO2 29 01/22/2022   Lab Results  Component Value Date   ALT 15 01/22/2022   AST 13 01/22/2022   ALKPHOS 75 05/21/2018   BILITOT 0.3 01/22/2022   Lab Results  Component Value Date   HGBA1C 5.0 01/22/2022   HGBA1C 5.0 01/22/2021   HGBA1C 4.6 10/10/2019   HGBA1C 4.8 10/06/2018   HGBA1C 4.7 04/13/2017   No results found for: "INSULIN" Lab Results  Component Value Date   TSH 1.70 01/22/2022   Lab Results  Component Value Date   CHOL 148 01/22/2022   HDL 66 01/22/2022   LDLCALC 65 01/22/2022   TRIG 85 01/22/2022   CHOLHDL 2.2 01/22/2022   Lab Results  Component Value Date   WBC 6.8 01/22/2022   HGB 14.4 01/22/2022   HCT 43.2 01/22/2022   MCV 91.7 01/22/2022   PLT 329 01/22/2022   Lab Results  Component Value Date   IRON 92 01/22/2021   TIBC  391 01/22/2021    Attestation Statements:   Reviewed by clinician on day of visit: allergies, medications, problem list, medical history, surgical history, family history, social history, and previous encounter notes.  I, Kyung Rudd, BS, CMA, am acting as transcriptionist for [employer], DO.  I have reviewed the above documentation for accuracy and completeness, and I agree with the above. - ***

## 2022-06-05 ENCOUNTER — Ambulatory Visit (INDEPENDENT_AMBULATORY_CARE_PROVIDER_SITE_OTHER): Payer: BC Managed Care – PPO | Admitting: Bariatrics

## 2022-06-11 ENCOUNTER — Ambulatory Visit (INDEPENDENT_AMBULATORY_CARE_PROVIDER_SITE_OTHER): Payer: BC Managed Care – PPO | Admitting: Family Medicine

## 2022-06-19 ENCOUNTER — Ambulatory Visit (INDEPENDENT_AMBULATORY_CARE_PROVIDER_SITE_OTHER): Payer: BC Managed Care – PPO | Admitting: Bariatrics

## 2022-06-25 ENCOUNTER — Other Ambulatory Visit: Payer: Self-pay | Admitting: Nurse Practitioner

## 2022-06-30 ENCOUNTER — Other Ambulatory Visit (HOSPITAL_COMMUNITY): Payer: Self-pay

## 2022-06-30 ENCOUNTER — Ambulatory Visit (INDEPENDENT_AMBULATORY_CARE_PROVIDER_SITE_OTHER): Payer: BC Managed Care – PPO | Admitting: Neurology

## 2022-06-30 ENCOUNTER — Encounter: Payer: Self-pay | Admitting: Neurology

## 2022-06-30 VITALS — BP 133/87 | HR 109 | Ht 65.0 in | Wt 229.2 lb

## 2022-06-30 DIAGNOSIS — F331 Major depressive disorder, recurrent, moderate: Secondary | ICD-10-CM | POA: Diagnosis not present

## 2022-06-30 DIAGNOSIS — F17200 Nicotine dependence, unspecified, uncomplicated: Secondary | ICD-10-CM

## 2022-06-30 DIAGNOSIS — E661 Drug-induced obesity: Secondary | ICD-10-CM

## 2022-06-30 DIAGNOSIS — R0683 Snoring: Secondary | ICD-10-CM

## 2022-06-30 DIAGNOSIS — G4710 Hypersomnia, unspecified: Secondary | ICD-10-CM | POA: Diagnosis not present

## 2022-06-30 DIAGNOSIS — F32A Depression, unspecified: Secondary | ICD-10-CM | POA: Insufficient documentation

## 2022-06-30 DIAGNOSIS — R5383 Other fatigue: Secondary | ICD-10-CM

## 2022-06-30 DIAGNOSIS — R351 Nocturia: Secondary | ICD-10-CM

## 2022-06-30 DIAGNOSIS — F5105 Insomnia due to other mental disorder: Secondary | ICD-10-CM | POA: Diagnosis not present

## 2022-06-30 DIAGNOSIS — F99 Mental disorder, not otherwise specified: Secondary | ICD-10-CM | POA: Insufficient documentation

## 2022-06-30 DIAGNOSIS — Z6838 Body mass index (BMI) 38.0-38.9, adult: Secondary | ICD-10-CM

## 2022-06-30 MED ORDER — AMPHETAMINE-DEXTROAMPHET ER 20 MG PO CP24
20.0000 mg | ORAL_CAPSULE | Freq: Every day | ORAL | 0 refills | Status: DC
  Filled 2022-06-30: qty 30, 30d supply, fill #0

## 2022-06-30 MED ORDER — AMPHETAMINE-DEXTROAMPHET ER 30 MG PO CP24
30.0000 mg | ORAL_CAPSULE | Freq: Every day | ORAL | 0 refills | Status: DC
  Filled 2022-06-30: qty 30, 30d supply, fill #0

## 2022-06-30 NOTE — Progress Notes (Signed)
SLEEP MEDICINE CLINIC    Provider:  Melvyn Novas, MD  Primary Care Physician:  [name], MD [address] [place] [address]     Referring Provider: Judd Gaudier, Np [address] [place],  [place] [address]          Chief Complaint according to patient   Patient presents with:     New Patient (Initial Visit)     RM 10, alone. Internal referral OSA eval. She is always fatigued in spite of taking adderall [facility]) currently.  Has been on this for years. Always very, very sleepy.  Gets on average about 10 hours of sleep at night. Goes to bed around 8pm. Wakes up a few times during the night to go to the bathroom.  Wakes unrefreshed- even after sleeping 13 hours. Depression.       HISTORY OF PRESENT ILLNESS:  Angelica Weeks is a 51 y.o. Caucasian female patient was seen  on 06/30/2022 from Dr Michaelle Birks office , [place]. for an evaluation of sleepiness, hypersomnia with prolonged sleep time.  Chief concern according to patient :  hypersomnia, extreme fatigue. She had a sleep study over 10 years ago, not sure where and how.     Angelica Weeks has a past medical history of ADD (attention deficit disorder), Anxiety, Bipolar 1 disorder, depressed (HCC), Cannabis abuse with psychotic disorder with delusions (HCC) (05/22/2018), Cellulitis of left eyelid (01/2014), Major Depression with psychotic , Paranoid delusion (HCC) (05/22/2018), Psychosis(HCC), and Vitamin D deficiency.   Sleep relevant medical history: Nocturia 3 ties or more- no Tonsillectomy, no TBI or cervical spine injury- Tobacco smoker,  but denies SOB.  Family medical /sleep history: adopted.   Social history:  Patient is not working, and used to worked as a Haematologist-  and lives in a household with 3 persons/ BF , step son (7) and one daughter (40) alone.  Family status is in a committed relationship. Pets ; one dog is present. Not sleeping  in bedroom.  Tobacco use- currently 20/ a day .   ETOH use -none,  Caffeine intake in form of Coffee( 2 cups in AM ) Soda( /) Tea ( /) some Red Bull energy drinks. Regular exercise in form of :none.        Sleep habits are as follows: The patient's dinner time is between 5-6 PM. The patient goes to bed at 8-9 PM and takes a sleep aid- continues to sleep for  intervals of 1-3 hours, wakes for 3 bathroom breaks, the first time at 10 PM, 2 Am and 5 AM .  Bedroom is cool, quiet and dark-   The preferred sleep position is sideways or prone, with the support of 1 pillow- more gives her a headaches.  Dreams are reportedly  frequent/ not vivid. No hot flushes- she is menopausal.   7 AM is the usual rise time. The patient wakes up with an alarm.  She reports not feeling refreshed or restored in AM, with symptoms such as dry mouth, no morning headaches, and residual fatigue. Naps are avoided- she drinks red bull- taken at 3 Pm infrequently, lasting from 60 to 90 minutes and are as lacking in refreshment as nocturnal sleep. Never feels like high quality sleep- Last time she has slept well, was in her twenties.    Review of Systems: Out of a complete 14 system review, the patient complains of only the following symptoms, and all  other reviewed systems are negative.:  Fatigue, sleepiness , loud snoring, fragmented sleep, not a true Insomnia - nocturia. Hypersomnia and  high fatigue without pain, depression    How likely are you to doze in the following situations: 0 = not likely, 1 = slight chance, 2 = moderate chance, 3 = high chance   Sitting and Reading? Watching Television? Sitting inactive in a public place (theater or meeting)? As a passenger in a car for an hour without a break? Lying down in the afternoon when circumstances permit? Sitting and talking to someone? Sitting quietly after lunch without alcohol? In a car, while stopped for a few minutes in traffic?   Total = 13/ 24 points   FSS  endorsed at 60/ 63 points.   Social History   Socioeconomic History   Marital status: Divorced    Spouse name: Aliene Beams, BF of 15 yea   Number of children: 2   Years of education: Associate's Degree in Accounting   Highest education level: Associate degree: academic program  Occupational History   Occupation: unemployed   Occupation: Paramedic  Tobacco Use   Smoking status: Every Day    Packs/day: 1.00    Years: 32.00    Total pack years: 32.00    Types: Cigarettes    Start date: 1990   Smokeless tobacco: Never  Vaping Use   Vaping Use: Never used  Substance and Sexual Activity   Alcohol use: Not Currently    Comment: Last in 2021.   Drug use: Not Currently    Types: Marijuana    Comment: last in 2021, none since   Sexual activity: Yes    Birth control/protection: Post-menopausal  Other Topics Concern   Not on file  Social History Narrative   Right handed   Coffee- 1-2 cups per morning   Lives with boyfriend, daughter, son-in-law   Social Determinants of Health   Financial Resource Strain: Low Risk  (07/24/2018)   Overall Financial Resource Strain (CARDIA)    Difficulty of Paying Living Expenses: Not very hard  Food Insecurity: No Food Insecurity (07/24/2018)   Hunger Vital Sign    Worried About Running Out of Food in the Last Year: Never true    Ran Out of Food in the Last Year: Never true  Transportation Needs: No Transportation Needs (07/24/2018)   PRAPARE - Administrator, Civil Service (Medical): No    Lack of Transportation (Non-Medical): No  Physical Activity: Insufficiently Active (10/06/2018)   Exercise Vital Sign    Days of Exercise per Week: 7 days    Minutes of Exercise per Session: 10 min  Stress: Stress Concern Present (07/24/2018)   [employer] of Occupational Health - Occupational Stress Questionnaire    Feeling of Stress : Rather much  Social Connections: Somewhat Isolated (07/24/2018)   Social Connection and Isolation Panel  [NHANES]    Frequency of Communication with Friends and Family: Once a week    Frequency of Social Gatherings with Friends and Family: Never    Attends Religious Services: 1 to 4 times per year    Active Member of [employer] or Organizations: No    Attends Engineer, structural: Not asked    Marital Status: Living with partner    Family History  Adopted: Yes  Family history unknown: Yes    Past Medical History:  Diagnosis Date   ADD (attention deficit disorder)    Anxiety    Bipolar 1 disorder, depressed (HCC)  Cannabis abuse with psychotic disorder with delusions ([place]) 05/22/2018   Cellulitis of left eyelid 01/2014   Depression    Dysmenorrhea    Paranoid delusion ([place]) 05/22/2018   Schizophrenia ([place])    Vitamin D deficiency     Past Surgical History:  Procedure Laterality Date   WISDOM TOOTH EXTRACTION       Current Outpatient Medications on File Prior to Visit  Medication Sig Dispense Refill   ALPRAZolam (XANAX) 1 MG tablet Take 1 mg by mouth at bedtime as needed for anxiety.     atomoxetine (STRATTERA) 25 MG capsule Take 25 mg by mouth daily. Pt takes 50 mg daily     Cyanocobalamin 5000 MCG SUBL Place 1 tablet (5,000 mcg total) under the tongue once a week.     hydrOXYzine (ATARAX) 50 MG tablet Take 50 mg by mouth daily at 12 noon.     lamoTRIgine (LAMICTAL) 200 MG tablet Take 2 tablets (400 mg total) by mouth at bedtime.     Magnesium 500 MG CAPS Take by mouth.     metFORMIN (GLUCOPHAGE-XR) 500 MG 24 hr tablet Take 1 tab daily with largest meal of the day for 4 weeks; if tolerating well increase to twice daily with meals for insulin resistance. 180 tablet 3   ondansetron (ZOFRAN) 4 MG tablet TAKE 1 TABLET 3 TIMES DAILY IF NEEDED FOR NAUSEA 30 tablet 0   pantoprazole (PROTONIX) 40 MG tablet Take 1 tablet (40 mg total) by mouth daily. 90 tablet 1   traZODone (DESYREL) 100 MG tablet Take 100 mg by mouth. Takes 200 mg at bedtime.     VITAMIN D PO Take 10,000 Units  by mouth daily.     VRAYLAR 6 MG CAPS TAKE 1 CAPSULE BY MOUTH DAILY 30 capsule 3   No current facility-administered medications on file prior to visit.    Allergies  Allergen Reactions   Aspirin Other (See Comments)    Burns stomach    Physical exam:  Today's Vitals   06/30/22 0958  BP: 133/87  Pulse: (!) 109  Weight: 229 lb 3.2 oz (104 kg)  Height: 5\' 5"  (1.651 m)   Body mass index is 38.14 kg/m.   Wt Readings from Last 3 Encounters:  06/30/22 229 lb 3.2 oz (104 kg)  05/28/22 222 lb (100.7 kg)  04/30/22 219 lb (99.3 kg)     Ht Readings from Last 3 Encounters:  06/30/22 5\' 5"  (1.651 m)  05/28/22 5\' 5"  (1.651 m)  03/24/22 5' 5.5" (1.664 m)      General: The patient is awake, alert and appears not in acute distress. The patient is well groomed. Head: Normocephalic, atraumatic.  Neck is supple. Mallampati 3 plus ,  neck circumference:15.5 inches . Nasal airflow patent.  Retrognathia is not seen.  Dental status:biological   Cardiovascular:  Regular rate and cardiac rhythm by pulse,  without distended neck veins. Respiratory: Lungs are clear to auscultation.  Skin:  Without evidence of ankle edema, or rash. Trunk: The patient's posture is erect.   Neurologic exam : The patient is awake and alert, oriented to place and time.   Memory subjective described as intact.  Attention span & concentration ability appears normal.  Speech is fluent, without dysarthria, dysphonia or aphasia.  Mood and affect are demure/ depressed.    Cranial nerves: no loss of smell or taste reported  Pupils are equal and briskly reactive to light. Funduscopic exam deferred. .  Extraocular movements in vertical and horizontal planes  were intact and without nystagmus. No Diplopia. Visual fields by finger perimetry are intact. Hearing was intact to soft voice and finger rubbing.    Facial sensation intact to fine touch.  Facial motor strength is symmetric and tongue and uvula move midline.   Neck ROM : rotation, tilt and flexion extension were normal for age and shoulder shrug was symmetrical.    Motor exam:  Symmetric bulk, tone and ROM.   Normal tone without cog -wheeling, symmetric grip strength .   Sensory:  Fine touch and vibration were  normal.  Proprioception tested in the upper extremities was normal.   Coordination: Rapid alternating movements in the fingers/hands were of normal speed.  The Finger-to-nose maneuver was intact without evidence of ataxia, dysmetria or tremor.   Gait and station: Patient could rise unassisted from a seated position, walked without assistive device.  Stance is of normal width/ base and the patient turned with 3 steps.  Toe and heel walk were deferred.  Deep tendon reflexes: in the  upper and lower extremities are symmetric and intact.  Babinski response was deferred .       After spending a total time of  40  minutes face to face and additional time for physical and neurologic examination, review of laboratory studies,  personal review of imaging studies, reports and results of other testing and review of referral information / records as far as provided in visit, I have established the following assessments:  1) this patient reports non restorative sleep, non refreshing sleep and excessive daytime sleepiness in the setting of fatigue ( high severity) and prolonged sleep time. Naps are longer than 60 minutes and also not refreshing. Dreams are present but not nightmarish or vivid.  2) excessive daytime sleepiness for years, continued under 50 mg adderall Xr per day- a very high dose.  3) insomnia to initiate sleep-  Hydroxyzine to be taken at bedtime , not at noon !    My Plan is to proceed with:  1) Angelica Weeks , a 51 year old Caucasian female patient presents today with a longstanding history of hypersomnia excessive and chronic fatigue, but also a history of bipolar depression with at times major depressive episodes.  Her sleep  disorder however seems not to be cyclic she describes frequent difficulties to initiate sleep and to remain asleep.  2)She also mentioned at least 3 nocturnal bathroom breaks and after each it can be difficult to reinitiate sleep.  She does never feel refreshed or restored and has not in years.   She has tried various sleep aids and currently has hydroxyzine and trazodone available as well as Xanax.    I3) insomnia: She denies waking up with panic attacks or being highly anxious.  There is also no minute menopausal contribution to her sleep disorder.   She has been on such high doses of Adderall for such a long time that she is now numb to it it does not feel as if it helps her to maintain wakefulness or focus. 4)  she snores.   I will order an attended sleep study for this patient given that weaned to classify the arousals from sleep and to see if her sleep architecture is  normal- SPLIT at [facility] of 10/h, early arrivals , adjustable bed 2 or  pillows.  She cannot afford to wean off adderall she stated, as she feels unsafe to drive.  Her psychiatric care provider Jan Fireman, NP  I would like to thank [name], MD and  Liane Comber, [address] [place],  [place] [address] for allowing me to meet with and to take care of this pleasant patient.   In short, Angelica Weeks is presenting with hypersomnia, excessive fatigue and depression, some may be related to OSA- snoring and nocturia are present, she is a smoker. She denies cataplexy, vivid dreams and sleep paralysis, but sometimes sleep attacks.   I plan to follow up either personally or through our NP within 3 months.   CC: I will share my notes with PCP and [name], PA.  Electronically signed by: Larey Seat, MD 06/30/2022 10:14 AM  [facility] Board certified by The [employer] of Sleep Medicine and Diplomate of the [employer] of Sleep Medicine. Board  certified In Neurology through the [place], Fellow of the [employer] of Neurology. Medical Director of [facility].

## 2022-06-30 NOTE — Patient Instructions (Addendum)
Sleep hygiene:  No caffeine after noon time.  No adderall after noon time.  Take sleep aids at bedtime.  Set bedtime for 9.30 PM and rise time for 8 hours later , avoiding all daytime naps.  The same on week days and weekends. No screen in bed. Hor shower or bath before bedtime. YOUR GUIDE TO BETTER SLEEP, google NIH booklet for insomnia.     Screening for Sleep Apnea  Sleep apnea is a condition in which breathing pauses or becomes shallow during sleep. Sleep apnea screening is a test to determine if you are at risk for sleep apnea. The test includes a series of questions. It will only takes a few minutes. Your health care provider may ask you to have this test in preparation for surgery or as part of a physical exam. What are the symptoms of sleep apnea? Common symptoms of sleep apnea include: Snoring. Waking up often at night. Daytime sleepiness. Pauses in breathing. Choking or gasping during sleep. Irritability. Forgetfulness. Trouble thinking clearly. Depression. Personality changes. Most people with sleep apnea do not know that they have it. What are the advantages of sleep apnea screening? Getting screened for sleep apnea can help: Ensure your safety. It is important for your health care providers to know whether or not you have sleep apnea, especially if you are having surgery or have other long-term (chronic) health conditions. Improve your health and allow you to get a better night's rest. Restful sleep can help you: Have more energy. Lose weight. Improve high blood pressure. Improve diabetes management. Prevent stroke. Prevent car accidents. What happens during the screening? Screening usually includes being asked a list of questions about your sleep quality. Some questions you may be asked include: Do you snore? Is your sleep restless? Do you have daytime sleepiness? Has a partner or spouse told you that you stop breathing during sleep? Have you had trouble  concentrating or memory loss? What is your age? What is your neck circumference? To measure your neck, keep your back straight and gently wrap the tape measure around your neck. Put the tape measure at the middle of your neck, between your chin and collarbone. What is your sex assigned at birth? Do you have or are you being treated for high blood pressure? If your screening test is positive, you are at risk for the condition. Further testing may be needed to confirm a diagnosis of sleep apnea. Where to find more information You can find screening tools online or at your health care clinic. For more information about sleep apnea screening and healthy sleep, visit these websites: Centers for Disease Control and Prevention: http://www.wolf.info/ American Sleep Apnea Association: www.sleepapnea.org Contact a health care provider if: You think that you may have sleep apnea. Summary Sleep apnea screening can help determine if you are at risk for sleep apnea. It is important for your health care providers to know whether or not you have sleep apnea, especially if you are having surgery or have other chronic health conditions. You may be asked to take a screening test for sleep apnea in preparation for surgery or as part of a physical exam. This information is not intended to replace advice given to you by your health care provider. Make sure you discuss any questions you have with your health care provider. Document Revised: 11/09/2020 Document Reviewed: 11/09/2020 Elsevier Patient Education  [place]. Fatigue If you have fatigue, you feel tired all the time and have a lack of energy or a  lack of motivation. Fatigue may make it difficult to start or complete tasks because of exhaustion. Occasional or mild fatigue is often a normal response to activity or life. However, long-term (chronic) or extreme fatigue may be a symptom of a medical condition such as: Depression. Not having enough red blood  cells or hemoglobin in the blood (anemia). A problem with a small gland located in the lower front part of the neck (thyroid disorder). Rheumatologic conditions. These are problems related to the body's defense system (immune system). Infections, especially certain viral infections. Fatigue can also lead to negative health outcomes over time. Follow these instructions at home: Medicines Take over-the-counter and prescription medicines only as told by your health care provider. Take a multivitamin if told by your health care provider. Do not use herbal or dietary supplements unless they are approved by your health care provider. Eating and drinking  Avoid heavy meals in the evening. Eat a well-balanced diet, which includes lean proteins, whole grains, plenty of fruits and vegetables, and low-fat dairy products. Avoid eating or drinking too many products with caffeine in them. Avoid alcohol. Drink enough fluid to keep your urine pale yellow. Activity  Exercise regularly, as told by your health care provider. Use or practice techniques to help you relax, such as yoga, tai chi, meditation, or massage therapy. Lifestyle Change situations that cause you stress. Try to keep your work and personal schedules in balance. Do not use recreational or illegal drugs. General instructions Monitor your fatigue for any changes. Go to bed and get up at the same time every day. Avoid fatigue by pacing yourself during the day and getting enough sleep at night. Maintain a healthy weight. Contact a health care provider if: Your fatigue does not get better. You have a fever. You suddenly lose or gain weight. You have headaches. You have trouble falling asleep or sleeping through the night. You feel angry, guilty, anxious, or sad. You have swelling in your legs or another part of your body. Get help right away if: You feel confused, feel like you might faint, or faint. Your vision is blurry or you have  a severe headache. You have severe pain in your abdomen, your back, or the area between your waist and hips (pelvis). You have chest pain, shortness of breath, or an irregular or fast heartbeat. You are unable to urinate, or you urinate less than normal. You have abnormal bleeding from the rectum, nose, lungs, nipples, or, if you are female, the vagina. You vomit blood. You have thoughts about hurting yourself or others. These symptoms may be an emergency. Get help right away. Call 911. Do not wait to see if the symptoms will go away. Do not drive yourself to the hospital. Get help right away if you feel like you may hurt yourself or others, or have thoughts about taking your own life. Go to your nearest emergency room or: Call 911. Call the [place] at [phone] or 988. This is open 24 hours a day. Text the Crisis Text Line at 787 726 0559. Summary If you have fatigue, you feel tired all the time and have a lack of energy or a lack of motivation. Fatigue may make it difficult to start or complete tasks because of exhaustion. Long-term (chronic) or extreme fatigue may be a symptom of a medical condition. Exercise regularly, as told by your health care provider. Change situations that cause you stress. Try to keep your work and personal schedules in balance. This information  is not intended to replace advice given to you by your health care provider. Make sure you discuss any questions you have with your health care provider. Document Revised: 09/23/2021 Document Reviewed: 09/23/2021 Elsevier Patient Education  [place]. Hypersomnia Hypersomnia is a condition in which a person feels very tired during the day even though the person gets plenty of sleep at night. A person with this condition may take naps during the day and may find it very difficult to wake up from sleep. Hypersomnia may affect a person's ability to think, concentrate, drive, or remember  things. What are the causes? The cause of this condition may not be known. Possible causes include: Taking certain medicines. Using drugs or alcohol. Sleep disorders, such as narcolepsy and sleep apnea. Injury to the head, brain, or spinal cord. Tumors. Certain medical conditions. These include: Depression. Diabetes. Gastroesophageal reflux disease (GERD). An underactive thyroid gland (hypothyroidism). What are the signs or symptoms? The main symptoms of hypersomnia include: Feeling very tired throughout the day, regardless of how much sleep you got the night before. Having trouble waking up. Others may find it difficult to wake you up when you are sleeping. Sleeping for longer and longer periods at a time. Taking naps throughout the day. Other symptoms may include: Feeling restless, anxious, or annoyed. Lacking energy. Having trouble with: Remembering. Speaking. Thinking. Loss of appetite. Seeing, hearing, tasting, smelling, or feeling things that are not real (hallucinations). How is this diagnosed? This condition may be diagnosed based on: Your symptoms and medical history. Your sleeping habits. Your health care provider may ask you to write down your sleeping habits in a daily sleep log, along with any symptoms you have. A series of tests that are done while you sleep (sleep study or polysomnogram). A test that measures how quickly you can fall asleep during the day (daytime nap study or multiple sleep latency test). How is this treated? This condition may be treated by: Following a regular sleep routine. Making lifestyle changes, such as changing your eating habits, getting regular exercise, and avoiding alcohol or caffeinated beverages. Taking medicines to make you more alert (stimulants) during the day. Treating any underlying medical causes of hypersomnia. Follow these instructions at home: Sleep habits Stick to a routine that includes going to bed and waking up at  the same times every day and night. Practice a relaxing bedtime routine. This may include reading, meditation, deep breathing, or taking a warm bath before going to sleep. Exercise regularly as told by your health care provider. However, avoid exercising in the hours right before bedtime. Keep your sleep environment at a cooler temperature, darkened, and quiet. Sleep with pillows and a mattress that are comfortable and supportive. Schedule short 20-minute naps for when you feel sleepiest during the day. Talk with your employer or teachers about your hypersomnia. If possible, adjust your schedule so that: You have a regular daytime work schedule. You can take a scheduled nap during the day. You do not have to work or be active at night. Do not eat a heavy meal for a few hours before bedtime. Eat your meals at about the same times every day. Safety  Do not drive or use machinery if you are sleepy. Ask your health care provider if it is safe for you to drive. Wear a life jacket when swimming or spending time near water. General instructions  Take over-the-counter and prescription medicines only as told by your health care provider. This includes supplements. Avoid drinking  alcohol or caffeinated beverages. Keep a sleep log that will help your health care provider manage your condition. This may include information about: What time you go to bed each night. How often you wake up at night. How many hours you sleep at night. How often and for how long you nap during the day. Any observations from others, such as leg movements during sleep, sleep walking, or snoring. Keep all follow-up visits. This is important. Contact a health care provider if: You have new symptoms. Your symptoms get worse. Get help right away if: You have thoughts about hurting yourself or someone else. Get help right away if you feel like you may hurt yourself or others, or have thoughts about taking your own life. Go to  your nearest emergency room or: Call 911. Call the [place] at [phone] or 988. This is open 24 hours a day. Text the Crisis Text Line at 262-514-8799. Summary Hypersomnia refers to a condition in which you feel very tired during the day even though you get plenty of sleep at night. A person with this condition may take naps during the day and may find it very difficult to wake up from sleep. Hypersomnia may affect a person's ability to think, concentrate, drive, or remember things. Treatment may include a regular sleep routine and making some lifestyle changes. This information is not intended to replace advice given to you by your health care provider. Make sure you discuss any questions you have with your health care provider. Document Revised: 11/11/2021 Document Reviewed: 11/11/2021 Elsevier Patient Education  [place].

## 2022-06-30 NOTE — Addendum Note (Signed)
Addended by: Melvyn Novas on: 06/30/2022 11:03 AM   Modules accepted: Orders

## 2022-07-01 ENCOUNTER — Ambulatory Visit (INDEPENDENT_AMBULATORY_CARE_PROVIDER_SITE_OTHER): Payer: BC Managed Care – PPO | Admitting: Family Medicine

## 2022-07-01 ENCOUNTER — Encounter (INDEPENDENT_AMBULATORY_CARE_PROVIDER_SITE_OTHER): Payer: Self-pay | Admitting: Family Medicine

## 2022-07-01 VITALS — BP 124/84 | HR 94 | Temp 98.0°F | Ht 65.0 in | Wt 226.0 lb

## 2022-07-01 DIAGNOSIS — F39 Unspecified mood [affective] disorder: Secondary | ICD-10-CM

## 2022-07-01 DIAGNOSIS — Z9189 Other specified personal risk factors, not elsewhere classified: Secondary | ICD-10-CM

## 2022-07-01 DIAGNOSIS — Z6837 Body mass index (BMI) 37.0-37.9, adult: Secondary | ICD-10-CM

## 2022-07-01 DIAGNOSIS — R5383 Other fatigue: Secondary | ICD-10-CM | POA: Diagnosis not present

## 2022-07-01 DIAGNOSIS — E66812 Obesity, class 2: Secondary | ICD-10-CM

## 2022-07-01 DIAGNOSIS — E669 Obesity, unspecified: Secondary | ICD-10-CM

## 2022-07-01 DIAGNOSIS — E559 Vitamin D deficiency, unspecified: Secondary | ICD-10-CM

## 2022-07-01 DIAGNOSIS — E538 Deficiency of other specified B group vitamins: Secondary | ICD-10-CM

## 2022-07-01 DIAGNOSIS — R7301 Impaired fasting glucose: Secondary | ICD-10-CM

## 2022-07-02 LAB — COMPREHENSIVE METABOLIC PANEL
ALT: 47 IU/L — ABNORMAL HIGH (ref 0–32)
AST: 33 IU/L (ref 0–40)
Albumin/Globulin Ratio: 2 (ref 1.2–2.2)
Albumin: 4.4 g/dL (ref 3.9–4.9)
Alkaline Phosphatase: 120 IU/L (ref 44–121)
BUN/Creatinine Ratio: 12 (ref 9–23)
BUN: 9 mg/dL (ref 6–24)
Bilirubin Total: 0.3 mg/dL (ref 0.0–1.2)
CO2: 22 mmol/L (ref 20–29)
Calcium: 9.3 mg/dL (ref 8.7–10.2)
Chloride: 96 mmol/L (ref 96–106)
Creatinine, Ser: 0.78 mg/dL (ref 0.57–1.00)
Globulin, Total: 2.2 g/dL (ref 1.5–4.5)
Glucose: 97 mg/dL (ref 70–99)
Potassium: 4.5 mmol/L (ref 3.5–5.2)
Sodium: 134 mmol/L (ref 134–144)
Total Protein: 6.6 g/dL (ref 6.0–8.5)
eGFR: 93 mL/min/{1.73_m2} (ref >59)

## 2022-07-02 LAB — CBC WITH DIFFERENTIAL/PLATELET
Basophils Absolute: 0 10*3/uL (ref 0.0–0.2)
Basos: 1 %
EOS (ABSOLUTE): 0.2 10*3/uL (ref 0.0–0.4)
Eos: 2 %
Hematocrit: 36.4 % (ref 34.0–46.6)
Hemoglobin: 12.8 g/dL (ref 11.1–15.9)
Immature Grans (Abs): 0 10*3/uL (ref 0.0–0.1)
Immature Granulocytes: 0 %
Lymphocytes Absolute: 2.4 10*3/uL (ref 0.7–3.1)
Lymphs: 31 %
MCH: 32.1 pg (ref 26.6–33.0)
MCHC: 35.2 g/dL (ref 31.5–35.7)
MCV: 91 fL (ref 79–97)
Monocytes Absolute: 0.8 10*3/uL (ref 0.1–0.9)
Monocytes: 9 %
Neutrophils Absolute: 4.6 10*3/uL (ref 1.4–7.0)
Neutrophils: 57 %
Platelets: 334 10*3/uL (ref 150–450)
RBC: 3.99 x10E6/uL (ref 3.77–5.28)
RDW: 14.1 % (ref 11.7–15.4)
WBC: 8 10*3/uL (ref 3.4–10.8)

## 2022-07-02 LAB — INSULIN, RANDOM: INSULIN: 9.2 u[IU]/mL (ref 2.6–24.9)

## 2022-07-02 LAB — HEMOGLOBIN A1C
Est. average glucose Bld gHb Est-mCnc: 97 mg/dL
Hgb A1c MFr Bld: 5 % (ref 4.8–5.6)

## 2022-07-02 LAB — LIPID PANEL WITH LDL/HDL RATIO
Cholesterol, Total: 169 mg/dL (ref 100–199)
HDL: 68 mg/dL (ref >39)
LDL Chol Calc (NIH): 85 mg/dL (ref 0–99)
LDL/HDL Ratio: 1.3 ratio (ref 0.0–3.2)
Triglycerides: 84 mg/dL (ref 0–149)
VLDL Cholesterol Cal: 16 mg/dL (ref 5–40)

## 2022-07-02 LAB — VITAMIN D 25 HYDROXY (VIT D DEFICIENCY, FRACTURES): Vit D, 25-Hydroxy: 81 ng/mL (ref 30.0–100.0)

## 2022-07-02 LAB — TSH: TSH: 1.12 u[IU]/mL (ref 0.450–4.500)

## 2022-07-02 LAB — FOLATE: Folate: 6.4 ng/mL (ref >3.0)

## 2022-07-02 LAB — VITAMIN B12: Vitamin B-12: 1946 pg/mL — ABNORMAL HIGH (ref 232–1245)

## 2022-07-02 LAB — T4, FREE: Free T4: 1.29 ng/dL (ref 0.82–1.77)

## 2022-07-03 ENCOUNTER — Other Ambulatory Visit (HOSPITAL_COMMUNITY): Payer: Self-pay

## 2022-07-06 NOTE — Progress Notes (Signed)
Chief Complaint:   OBESITY Angelica Weeks is here to discuss her progress with her obesity treatment plan along with follow-up of her obesity related diagnoses. Angelica Weeks is on the Category 2 Plan and states she is following her eating plan approximately 0% of the time. Angelica Weeks states she is not exercising.  Today's visit was #: 2 Starting weight: 222 lbs Starting date: 05/28/2022 Today's weight: 226 lbs Today's date: 07/01/2022 Total lbs lost to date: 0 Total lbs lost since last in-office visit: +4  Interim History: Last office visit 05/28/2022. Angelica Weeks was seen for the 1st time but thought with her work she would not be able to do program and left after getting her labs.  Today she is fasting and only drank water today and she is ready to recommit to process.  She needs all new paperwork.  She mostly eats chicken and hamburger.   Subjective:   1. Elevated fasting blood sugar Per Angelica Weeks she takes metformin due to elevated blood sugars.  History of diabetes mellitus or prediabetes.  In past fasting insulin level was 20.85 about five months ago.   2. B12 deficiency Per Angelica Weeks she is taking 5000 mcg of B12 weekly.  Complains of fatigue.  3. Vitamin D deficiency Angelica Weeks complains of fatigue.  She is taking 1,000 IU daily of Vitamin D,OTC. She is supposed to get OSA evaluation with Dr Tresa Endo of Cardiology in near future.   4. Mood disorder with emotinal eating Angelica Weeks is being treated by psychiatrist and PCP for medication management of mood.  She has a history of schizophrenia.  At last office visit she said a new job precluded her from coming here, but today she endorses she no longer has a job.   5. At risk for impaired metabolic function Angelica Weeks is at increased risk for impaired metabolic function due to current nutrition and muscle mass.   Assessment/Plan:   Orders Placed This Encounter  Procedures   VITAMIN D 25 Hydroxy (Vit-D Deficiency, Fractures)   TSH   T4, free   Lipid Panel With  LDL/HDL Ratio   Insulin, random   Hemoglobin A1c   Folate   Comprehensive metabolic panel   CBC with Differential/Platelet   Vitamin B12    There are no discontinued medications.   No orders of the defined types were placed in this encounter.    1. Elevated fasting blood sugar Continue medications per PCP.  Continue prudent nutritional plan and weight loss.  Check labs today.  - Lipid Panel With LDL/HDL Ratio - Insulin, random - Hemoglobin A1c  2. B12 deficiency Asked Angelica Weeks to confirm the dosage of her OTC B12 and notify us if it is different than what is reported.  Continue prudent nutritional plan and weight loss.  Check labs today.  - Folate - Vitamin B12  3. Vitamin D deficiency Low Vitamin D level contributes to fatigue and are associated with obesity, breast, and colon cancer. She agrees to continue to take OTC Vitamin D 1,000 IU every week (until labs are resulted) and will follow-up for routine testing of Vitamin D, at least 2-3 times per year to avoid over-replacement. Continue prudent nutritional plan and weight loss.  Check labs today.  - VITAMIN D 25 Hydroxy (Vit-D Deficiency, Fractures)  4. Mood disorder with emotinal eating Check labs today.  Continue management with specialist.   - TSH - T4, free - Comprehensive metabolic panel - CBC with Differential/Platelet  5. At risk for impaired metabolic function Angelica Weeks was given  approximately 9 minutes of impaired  metabolic function prevention counseling today. We discussed intensive lifestyle modifications today with an emphasis on specific nutrition and exercise instructions and strategies.   Repetitive spaced learning was employed today to elicit superior memory formation and behavioral change.   6. Obesity, current BMI 37.6 Meal plan reviewed with Angelica Weeks again and papers given on Category 2.   Angelica Weeks is currently in the action stage of change. As such, her goal is to continue with weight loss efforts.  She has agreed to the Category 2 Plan.   Exercise goals: As is.  Behavioral modification strategies: increasing lean protein intake and decreasing simple carbohydrates.  Makhya has agreed to follow-up with our clinic in 2 weeks. She was informed of the importance of frequent follow-up visits to maximize her success with intensive lifestyle modifications for her multiple health conditions.   Angelica Weeks was informed we would discuss her lab results at her next visit unless there is a critical issue that needs to be addressed sooner. Cathe agreed to keep her next visit at the agreed upon time to discuss these results.  Objective:   Blood pressure 124/84, pulse 94, temperature 98 F (36.7 C), height 5\' 5"  (1.651 m), weight 226 lb (102.5 kg), SpO2 98 %. Body mass index is 37.61 kg/m.  General: Cooperative, alert, well developed, in no acute distress. HEENT: Conjunctivae and lids unremarkable. Cardiovascular: Regular rhythm.  Lungs: Normal work of breathing. Neurologic: No focal deficits.   Lab Results  Component Value Date   CREATININE 0.78 07/01/2022   BUN 9 07/01/2022   NA 134 07/01/2022   K 4.5 07/01/2022   CL 96 07/01/2022   CO2 22 07/01/2022   Lab Results  Component Value Date   ALT 47 (H) 07/01/2022   AST 33 07/01/2022   ALKPHOS 120 07/01/2022   BILITOT 0.3 07/01/2022   Lab Results  Component Value Date   HGBA1C 5.0 07/01/2022   HGBA1C 5.0 01/22/2022   HGBA1C 5.0 01/22/2021   HGBA1C 4.6 10/10/2019   HGBA1C 4.8 10/06/2018   Lab Results  Component Value Date   INSULIN 9.2 07/01/2022   Lab Results  Component Value Date   TSH 1.120 07/01/2022   Lab Results  Component Value Date   CHOL 169 07/01/2022   HDL 68 07/01/2022   LDLCALC 85 07/01/2022   TRIG 84 07/01/2022   CHOLHDL 2.2 01/22/2022   Lab Results  Component Value Date   VD25OH 81.0 07/01/2022   VD25OH 93 01/22/2022   VD25OH 32 10/10/2019   Lab Results  Component Value Date   WBC 8.0 07/01/2022    HGB 12.8 07/01/2022   HCT 36.4 07/01/2022   MCV 91 07/01/2022   PLT 334 07/01/2022    Attestation Statements:   Reviewed by clinician on day of visit: allergies, medications, problem list, medical history, surgical history, family history, social history, and previous encounter notes.  I, Davy Pique, RMA, am acting as Location manager for [employer], DO.   I have reviewed the above documentation for accuracy and completeness, and I agree with the above. Marjory Sneddon, D.O.  The [place] was signed into law in 2016 which includes the topic of electronic health records.  This provides immediate access to information in MyChart.  This includes consultation notes, operative notes, office notes, lab results and pathology reports.  If you have any questions about what you read please let us know at your next visit so we can discuss your concerns and  take corrective action if need be.  We are right here with you.

## 2022-07-10 ENCOUNTER — Telehealth: Payer: Self-pay

## 2022-07-10 NOTE — Telephone Encounter (Signed)
LVM for pt to call back to schedule sleep study.  

## 2022-07-15 ENCOUNTER — Ambulatory Visit (INDEPENDENT_AMBULATORY_CARE_PROVIDER_SITE_OTHER): Payer: BC Managed Care – PPO | Admitting: Adult Health

## 2022-07-15 ENCOUNTER — Encounter (INDEPENDENT_AMBULATORY_CARE_PROVIDER_SITE_OTHER): Payer: Self-pay | Admitting: Adult Health

## 2022-07-15 VITALS — BP 106/73 | HR 109 | Temp 99.0°F | Ht 65.0 in | Wt 219.0 lb

## 2022-07-15 DIAGNOSIS — E538 Deficiency of other specified B group vitamins: Secondary | ICD-10-CM | POA: Diagnosis not present

## 2022-07-15 DIAGNOSIS — E669 Obesity, unspecified: Secondary | ICD-10-CM

## 2022-07-15 DIAGNOSIS — F39 Unspecified mood [affective] disorder: Secondary | ICD-10-CM

## 2022-07-15 DIAGNOSIS — E559 Vitamin D deficiency, unspecified: Secondary | ICD-10-CM | POA: Diagnosis not present

## 2022-07-15 DIAGNOSIS — Z0000 Encounter for general adult medical examination without abnormal findings: Secondary | ICD-10-CM

## 2022-07-15 DIAGNOSIS — Z6836 Body mass index (BMI) 36.0-36.9, adult: Secondary | ICD-10-CM

## 2022-07-15 DIAGNOSIS — Z9189 Other specified personal risk factors, not elsewhere classified: Secondary | ICD-10-CM

## 2022-07-15 DIAGNOSIS — R7401 Elevation of levels of liver transaminase levels: Secondary | ICD-10-CM

## 2022-07-15 DIAGNOSIS — Z7984 Long term (current) use of oral hypoglycemic drugs: Secondary | ICD-10-CM

## 2022-07-15 DIAGNOSIS — E8881 Metabolic syndrome: Secondary | ICD-10-CM

## 2022-07-15 DIAGNOSIS — E88819 Insulin resistance, unspecified: Secondary | ICD-10-CM

## 2022-07-15 NOTE — Telephone Encounter (Signed)
Patient returned the call she is scheduled at [facility] for 08/05/22 at 8 pm  Split- [employer] auth: [id number] (exp. 07/02/22 to 08/30/22)   I mailed packet to the patient.

## 2022-07-16 DIAGNOSIS — F331 Major depressive disorder, recurrent, moderate: Secondary | ICD-10-CM | POA: Diagnosis not present

## 2022-07-16 DIAGNOSIS — F419 Anxiety disorder, unspecified: Secondary | ICD-10-CM | POA: Diagnosis not present

## 2022-07-16 DIAGNOSIS — Z79899 Other long term (current) drug therapy: Secondary | ICD-10-CM | POA: Diagnosis not present

## 2022-07-16 DIAGNOSIS — F902 Attention-deficit hyperactivity disorder, combined type: Secondary | ICD-10-CM | POA: Diagnosis not present

## 2022-07-22 NOTE — Progress Notes (Unsigned)
Chief Complaint:   OBESITY Angelica Weeks is here to discuss her progress with her obesity treatment plan along with follow-up of her obesity related diagnoses. Angelica Weeks is on the Category 1 Plan and states she is following her eating plan approximately 100% of the time. Angelica Weeks states she is exercising 0 minutes 0 times per week.  Today's visit was #:3 Starting weight: 222 lbs Starting date: 05/28/2022 Today's weight: 219 lbs Today's date: 07/15/2022 Total lbs lost to date: 3 lbs Total lbs lost since last in-office visit: 7  Interim History: Per Angelica Weeks, her insurance will not cover GLP-1 therapy for obesity.   Of Note: she was on max dose of Mounjaro 15 mg for 6 months and did not lose a single lb.  Subjective:   1. B12 deficiency On 07/01/22 Angelica Weeks's B12 level was 1,946.  She is currently taking oral B12 5000 mg weekly. Discussed labs with pt today.  2. Transaminitis On 07/01/22, Angelica Weeks's CMP--ALT of 47.  She denies right upper quadrant pain.  She also denies any known history of hepatic steatosis.  Discussed labs with pt today.  3. Healthcare maintenance Lipid Panel     Component Value Date/Time   CHOL 169 07/01/2022 1533   TRIG 84 07/01/2022 1533   HDL 68 07/01/2022 1533   CHOLHDL 2.2 01/22/2022 0956   VLDL 14 04/13/2017 1117   LDLCALC 85 07/01/2022 1533   LDLCALC 65 01/22/2022 0956   LABVLDL 16 07/01/2022 1533    Angelica Weeks smokes a pack a day.  She has been using tobacco for 35 years. ASCVD 1.4%.  Failed with Chantix in the past. Discussed labs with pt today.  4. Vitamin D deficiency On 07/01/22 Angelica Weeks's Vit D level of 81.0.  She is currently taking over the counter Vit D3 10,000 IU daily. Discussed labs with pt today.  5. Insulin resistance Angelica Weeks's PCP manages Metformin XR 500 mg--2 tablets at 6pm. Her BG of 97, A1c was 5.0 and insulin level was 9.2.  Her insulin level on 01/22/22 was 20.8. Discussed labs with pt today.  6. Mood disorder with emotinal  eating Angelica Weeks's thyroid panel 07/01/22 was stable. She is currently taking Lamictal 200 mg--2 tablets every night and Vraylar 6 mg daily.  7. At risk for impaired function of liver Angelica Weeks is at risk for impaired function of liver due to likely diagnosis of fatty liver as evidenced by recent elevated liver enzymes.  Assessment/Plan:   1. B12 deficiency Tais will continue taking B12 5000 mcg every other week.  2. Transaminitis We will monitor labs.  Angelica Weeks will avoid hepatotoxic substances.  3. Healthcare maintenance Canda is to reduce tobacco use.  4. Vitamin D deficiency Continue taking Vit D3 10,000 IU every other day.  5. Insulin resistance Doralyn will continue taking Metformin per PCP.  6. Mood disorder with emotinal eating Angelica Weeks will continue mental health medications per specialist.   7. At risk for impaired function of liver Angelica Weeks was given approximately 15 minutes of counseling today regarding prevention of impaired liver function. Angelica Weeks was educated about her risk of developing NASH or even liver failure and advised that the only proven treatment for NAFLD was weight loss of at least 5-10% of body weight.   8. Obesity, current BMI 36.4 Angelica Weeks is currently in the action stage of change. As such, her goal is to continue with weight loss efforts. She has agreed to the Category 2 Plan.   Exercise goals: No exercise has been prescribed at this time.  Behavioral modification strategies: increasing lean protein intake, decreasing simple carbohydrates, meal planning and cooking strategies, keeping healthy foods in the home, and planning for success.  Angelica Weeks has agreed to follow-up with our clinic in 2 weeks. She was informed of the importance of frequent follow-up visits to maximize her success with intensive lifestyle modifications for her multiple health conditions.   Objective:   Blood pressure 106/73, pulse (!) 109, temperature 99 F (37.2 C), height 5\' 5"  (1.651  m), weight 219 lb (99.3 kg), SpO2 97 %. Body mass index is 36.44 kg/m.  General: Cooperative, alert, well developed, in no acute distress. HEENT: Conjunctivae and lids unremarkable. Cardiovascular: Regular rhythm.  Lungs: Normal work of breathing. Neurologic: No focal deficits.   Lab Results  Component Value Date   CREATININE 0.78 07/01/2022   BUN 9 07/01/2022   NA 134 07/01/2022   K 4.5 07/01/2022   CL 96 07/01/2022   CO2 22 07/01/2022   Lab Results  Component Value Date   ALT 47 (H) 07/01/2022   AST 33 07/01/2022   ALKPHOS 120 07/01/2022   BILITOT 0.3 07/01/2022   Lab Results  Component Value Date   HGBA1C 5.0 07/01/2022   HGBA1C 5.0 01/22/2022   HGBA1C 5.0 01/22/2021   HGBA1C 4.6 10/10/2019   HGBA1C 4.8 10/06/2018   Lab Results  Component Value Date   INSULIN 9.2 07/01/2022   Lab Results  Component Value Date   TSH 1.120 07/01/2022   Lab Results  Component Value Date   CHOL 169 07/01/2022   HDL 68 07/01/2022   LDLCALC 85 07/01/2022   TRIG 84 07/01/2022   CHOLHDL 2.2 01/22/2022   Lab Results  Component Value Date   VD25OH 81.0 07/01/2022   VD25OH 93 01/22/2022   VD25OH 32 10/10/2019   Lab Results  Component Value Date   WBC 8.0 07/01/2022   HGB 12.8 07/01/2022   HCT 36.4 07/01/2022   MCV 91 07/01/2022   PLT 334 07/01/2022   Lab Results  Component Value Date   IRON 92 01/22/2021   TIBC 391 01/22/2021   Attestation Statements:   Reviewed by clinician on day of visit: allergies, medications, problem list, medical history, surgical history, family history, social history, and previous encounter notes.  I, Sharissa Brierley, RMA, am acting as transcriptionist for 03/22/2021, NP.  I have reviewed the above documentation for accuracy and completeness, and I agree with the above. -  Katy d. Danford, NP-C

## 2022-07-23 ENCOUNTER — Encounter (INDEPENDENT_AMBULATORY_CARE_PROVIDER_SITE_OTHER): Payer: Self-pay

## 2022-07-28 ENCOUNTER — Other Ambulatory Visit (HOSPITAL_COMMUNITY): Payer: Self-pay

## 2022-07-28 MED ORDER — AMPHETAMINE-DEXTROAMPHET ER 20 MG PO CP24
20.0000 mg | ORAL_CAPSULE | Freq: Every day | ORAL | 0 refills | Status: DC
  Filled 2022-07-28 (×2): qty 30, 30d supply, fill #0

## 2022-07-28 MED ORDER — AMPHETAMINE-DEXTROAMPHET ER 30 MG PO CP24
30.0000 mg | ORAL_CAPSULE | Freq: Every day | ORAL | 0 refills | Status: DC
  Filled 2022-07-28 (×2): qty 30, 30d supply, fill #0

## 2022-07-31 ENCOUNTER — Ambulatory Visit (INDEPENDENT_AMBULATORY_CARE_PROVIDER_SITE_OTHER): Payer: BC Managed Care – PPO | Admitting: Family Medicine

## 2022-08-04 ENCOUNTER — Other Ambulatory Visit: Payer: Self-pay | Admitting: Nurse Practitioner

## 2022-08-05 ENCOUNTER — Ambulatory Visit (INDEPENDENT_AMBULATORY_CARE_PROVIDER_SITE_OTHER): Payer: BC Managed Care – PPO | Admitting: Neurology

## 2022-08-05 DIAGNOSIS — R0683 Snoring: Secondary | ICD-10-CM | POA: Diagnosis not present

## 2022-08-05 DIAGNOSIS — F5105 Insomnia due to other mental disorder: Secondary | ICD-10-CM

## 2022-08-05 DIAGNOSIS — R351 Nocturia: Secondary | ICD-10-CM

## 2022-08-05 DIAGNOSIS — G4710 Hypersomnia, unspecified: Secondary | ICD-10-CM

## 2022-08-05 DIAGNOSIS — E661 Drug-induced obesity: Secondary | ICD-10-CM

## 2022-08-14 ENCOUNTER — Encounter (INDEPENDENT_AMBULATORY_CARE_PROVIDER_SITE_OTHER): Payer: Self-pay | Admitting: Adult Health

## 2022-08-14 ENCOUNTER — Ambulatory Visit (INDEPENDENT_AMBULATORY_CARE_PROVIDER_SITE_OTHER): Payer: BC Managed Care – PPO | Admitting: Adult Health

## 2022-08-14 VITALS — BP 106/73 | HR 98 | Temp 98.0°F | Ht 65.0 in | Wt 212.0 lb

## 2022-08-14 DIAGNOSIS — E8881 Metabolic syndrome: Secondary | ICD-10-CM | POA: Diagnosis not present

## 2022-08-14 DIAGNOSIS — Z720 Tobacco use: Secondary | ICD-10-CM

## 2022-08-14 DIAGNOSIS — Z6835 Body mass index (BMI) 35.0-35.9, adult: Secondary | ICD-10-CM | POA: Diagnosis not present

## 2022-08-14 DIAGNOSIS — E669 Obesity, unspecified: Secondary | ICD-10-CM

## 2022-08-17 ENCOUNTER — Encounter (INDEPENDENT_AMBULATORY_CARE_PROVIDER_SITE_OTHER): Payer: Self-pay | Admitting: Adult Health

## 2022-08-17 NOTE — Progress Notes (Signed)
Chief Complaint:   OBESITY Angelica Weeks is here to discuss her progress with her obesity treatment plan along with follow-up of her obesity related diagnoses. Angelica Weeks is on the Category 2 Plan and states she is following her eating plan approximately 80% of the time. Angelica Weeks states she is walking 20 minutes 2 times per week.  Today's visit was #: 4 Starting weight: 222 lbs Starting date: 05/28/2022 Today's weight: 212 lbs Today's date: 08/14/2022 Total lbs lost to date: 10 lbs Total lbs lost since last in-office visit: 7 lbs  Interim History:  Ms. Angelica Weeks prefers a plan without meat protein.   Discussed Pescatarian and vegetarian plans.  She has a 31 year old step son and 41 year old daughter. Her daughter is a Holiday representative at [employer].  Subjective:   1. Tobacco use Continues to smoke a pack a day.   She declines smoke cessation.  2. Insulin resistance PCP provides Metformin XR 500 mg BID with meals.   She denies polyphagia with biguanide therapy.  Assessment/Plan:   1. Tobacco use Consider tobacco cessation in near future.  Reduce daily tobacco use.  2. Insulin resistance Continue metformin XR per PCP.   3. Obesity, current BMI 35.3 Angelica Weeks is currently in the action stage of change. As such, her goal is to continue with weight loss efforts. She has agreed to the [employer] and the Vegetarian Plan.   Exercise goals: No exercise has been prescribed at this time.  Behavioral modification strategies: increasing lean protein intake, decreasing simple carbohydrates, meal planning and cooking strategies, keeping healthy foods in the home, and planning for success.  Angelica Weeks has agreed to follow-up with our clinic in 3 weeks. She was informed of the importance of frequent follow-up visits to maximize her success with intensive lifestyle modifications for her multiple health conditions.   Objective:   Blood pressure 106/73, pulse 98, temperature 98 F (36.7 C), height  5\' 5"  (1.651 m), weight 212 lb (96.2 kg), SpO2 96 %. Body mass index is 35.28 kg/m.  General: Cooperative, alert, well developed, in no acute distress. HEENT: Conjunctivae and lids unremarkable. Cardiovascular: Regular rhythm.  Lungs: Normal work of breathing. Neurologic: No focal deficits.   Lab Results  Component Value Date   CREATININE 0.78 07/01/2022   BUN 9 07/01/2022   NA 134 07/01/2022   K 4.5 07/01/2022   CL 96 07/01/2022   CO2 22 07/01/2022   Lab Results  Component Value Date   ALT 47 (H) 07/01/2022   AST 33 07/01/2022   ALKPHOS 120 07/01/2022   BILITOT 0.3 07/01/2022   Lab Results  Component Value Date   HGBA1C 5.0 07/01/2022   HGBA1C 5.0 01/22/2022   HGBA1C 5.0 01/22/2021   HGBA1C 4.6 10/10/2019   HGBA1C 4.8 10/06/2018   Lab Results  Component Value Date   INSULIN 9.2 07/01/2022   Lab Results  Component Value Date   TSH 1.120 07/01/2022   Lab Results  Component Value Date   CHOL 169 07/01/2022   HDL 68 07/01/2022   LDLCALC 85 07/01/2022   TRIG 84 07/01/2022   CHOLHDL 2.2 01/22/2022   Lab Results  Component Value Date   VD25OH 81.0 07/01/2022   VD25OH 93 01/22/2022   VD25OH 32 10/10/2019   Lab Results  Component Value Date   WBC 8.0 07/01/2022   HGB 12.8 07/01/2022   HCT 36.4 07/01/2022   MCV 91 07/01/2022   PLT 334 07/01/2022   Lab Results  Component  Value Date   IRON 92 01/22/2021   TIBC 391 01/22/2021    Attestation Statements:   Reviewed by clinician on day of visit: allergies, medications, problem list, medical history, surgical history, family history, social history, and previous encounter notes.  I, Malcolm Metro, RMA, am acting as Energy manager for William Hamburger, NP.  I have reviewed the above documentation for accuracy and completeness, and I agree with the above. -  Ladanian Kelter d. Edmund Rick, NP-C

## 2022-08-20 NOTE — Procedures (Signed)
[facility]  POLYSOMNOGRAPHY  INTERPRETATION REPORT   STUDY DATE:  08/05/2022     PATIENT NAME:  Angelica Weeks         DATE OF BIRTH:  17-Jun-1971  PATIENT ID:  [id number]    TYPE OF STUDY:  SPLIT was ordered, PSG was performed.   READING PHYSICIAN: Melvyn Novas, MD REFERRED BY: Office of Dr. Oneta Rack., PA [place]. SCORING TECHNICIAN: Engineer, building services, RPSGT   HISTORY:  This 51 year-old Female patient was scheduled for a SPLIT night study on 8-22/23-2023. Angelica Weeks is a 51 year-old Caucasian female smoker with depression, sleeping up to 13 hours a day- seen  on 06/30/2022 from Dr Michaelle Birks office , PA [place] Aly for "hypersomnia with prolonged sleep time" and  extreme fatigue. She had a sleep study over 10 years ago, not sure where and how. Angelica Weeks has a past medical history of ADD (attention deficit disorder), Anxiety, Bipolar 1 disorder, depressed (HCC), Cannabis abuse with psychotic disorder with delusions (HCC) (05/22/2018), Cellulitis of left eyelid (01/2014), Major Depression with psychosis/ Paranoid delusion (HCC) (05/22/2018),  glucose intolerance, smoking unmotivated to quit, and Vitamin D deficiency.? ADDITIONAL INFORMATION:  The Epworth Sleepiness Scale was endorsed at 13 /24 points, and scores above or equal to 10 indicate hypersomnolence. FSS endorsed at  60 /63 points.  Height: 65 in Weight: 229 lb (BMI 38) Neck Size: 16 in  MEDICATIONS: Xanax, Strattera, Cyanocobalamin, Atarax, Lamictal, Magnesium, Glucophage, Zofran, Protonix, Trazodone, Vitamin D, Vraylar TECHNICAL DESCRIPTION: A registered sleep technologist ( RPSGT)  was in attendance for the duration of the recording.  Data collection, scoring, video monitoring, and reporting were performed in compliance with the AASM Manual for the Scoring of Sleep and Associated Events; (Hypopnea is scored based on the criteria listed in Section VIII D. 1b in the AASM Manual V2.6 using a 4% oxygen  desaturation rule or Hypopnea is scored based on the criteria listed in Section VIII D. 1a in the AASM Manual V2.6 using 3% oxygen desaturation and /or arousal rule).   SLEEP CONTINUITY AND SLEEP ARCHITECTURE:  Lights-out was at 21:08: and lights-on at  05:12:, with  8.1 hours of recording time ( 486 minutes) . Total sleep time ( TST) was 455.5 minutes with sleep efficiency at 94.0%. Sleep latency was decreased at 4.0 minutes.  REM sleep latency was increased at 194.5 minutes. Of the total sleep time, the percentage of stage N1 sleep was 3.7%, stage N2 sleep was 58%, stage N3 sleep was 22.7%, and REM sleep was 15.4%.  BODY POSITION:  TST was divided between the following sleep positions: sleep in supine 75 minutes (16%), non-supine 381 minutes (84%); right 194 minutes (43%), left 41 minutes (9%), and prone 145 minutes (32%).  Total supine REM sleep time was 00 minutes (0% of total REM sleep). There were 5 Stage R periods observed on this study night, 14 awakenings (i.e. transitions to Stage W from any sleep stage), and 66 total stage transitions. Wake after sleep onset (WASO) time accounted for 25 minutes . There was no bathroom break during sleep study.  RESPIRATORY MONITORING:  Based on AASM criteria (using a 3% oxygen desaturation and /or arousal rule for scoring hypopneas), there were 341 apneas (35 obstructive; 303 central; 3 mixed), and 147 hypopneas. Apnea index was 44.9/h. Hypopnea index was 19.4/h. The apnea-hypopnea index was 64.3/h overall ( events were :126.4/h in supine, 21/h in non-supine; 20.6/h in REM). There were 0 respiratory effort-related arousals (RERAs).  OXIMETRY: Oxyhemoglobin Saturation Nadir during sleep was at  62% ,  from a mean of 96%.  Of the Total sleep time (TST) in  hypoxemia (=<88%) was present for  0.6 minutes, or 0.1% of total sleep time.  LIMB MOVEMENTS: There were 0 periodic limb movements of sleep (0.0/hr), of which 0 (0.0/hr) were associated with an  arousal. AROUSAL: There were 111 arousals in total, for an arousal index of 15 /hour.  Of these, 62 were identified as respiratory-related arousals (8 /h), 0 were PLM-related arousals (0 /h), and 55 were non-specific arousals (7 /h). There were many occurrences of Cheyne Stokes breathing/ cyclic breathing . Snoring was classified as mild.  EEG:  PSG EEG was of normal amplitude and frequency, with symmetric manifestation of sleep stage related EEG pattern. EKG: The electrocardiogram documented  NSR. The mean heart rate during sleep was 84 bpm.  The heart rate during sleep varied between a minimum of  73 bpm and a maximum of  97 bpm. AUDIO and VIDEO: patient slept without vocalization or complex movements.  IMPRESSION: The patient arrived at the sleep lab and stated she had taken her Trazodone sleep medication, didn't need or bring the Xanax we had prescribed. The patient refused PAP therapy part of this SPLIT PSG study, PAP was supposed to be initiated at AHI 10/h - several interfaces had been given to the patient and tried on, yet none would stay on longer than 5 minutes. The patient reached an AHI of 109/h (!) and finished the study without therapy. 1) Sleep disordered breathing was present: Severe Central dominant Sleep apnea was seen with a total AHI of 64.15/h , cyclic breathing, sparing REM sleep and being worse in supine sleep position.  Periodic breathing dominated the night- no prolonged hypoxemia was noted. 2) Sleep efficiency was high on sleep medication -Total sleep time was 455.5 minutes.  Sleep efficiency was 94.0%.  the patient stated she was still very tired in AM> RECOMMENDATIONS: 1) for a patient with central apnea and periodic breathing, there is limited therapy- only BIPAP or ASV to treat the condition. The patient did not tolerate the interface / masks used for any PAP modality and I have no other treatment to offer.  2) central sleep apnea with cyclic breathing is most often  associated with changes in cardiac function (CHF), followed by opiate medication, and renal or liver failure. The patient indicated she is a smoker and not motivated to quit.     Melvyn Novas,  MD Medical Director of [facility].

## 2022-08-25 ENCOUNTER — Other Ambulatory Visit (HOSPITAL_COMMUNITY): Payer: Self-pay

## 2022-08-25 MED ORDER — AMPHETAMINE-DEXTROAMPHET ER 20 MG PO CP24
20.0000 mg | ORAL_CAPSULE | Freq: Every day | ORAL | 0 refills | Status: DC
Start: 2022-08-25 — End: 2022-09-19
  Filled 2022-08-25: qty 30, 30d supply, fill #0

## 2022-08-25 MED ORDER — AMPHETAMINE-DEXTROAMPHET ER 30 MG PO CP24
30.0000 mg | ORAL_CAPSULE | Freq: Every day | ORAL | 0 refills | Status: DC
Start: 2022-08-25 — End: 2022-09-19
  Filled 2022-08-25: qty 30, 30d supply, fill #0

## 2022-08-26 ENCOUNTER — Other Ambulatory Visit (HOSPITAL_COMMUNITY): Payer: Self-pay

## 2022-08-28 ENCOUNTER — Telehealth: Payer: Self-pay | Admitting: Neurology

## 2022-08-28 ENCOUNTER — Encounter: Payer: Self-pay | Admitting: Neurology

## 2022-08-28 NOTE — Telephone Encounter (Signed)
Called patient to discuss sleep study results. No answer at this time. LVM for the patient to call back.   

## 2022-08-28 NOTE — Telephone Encounter (Signed)
Pt returned the call and I was able to review the sleep study in detail with her. The pat had scheduled an follow up apt to discuss a medication to help her with staying awake during the daytime. Advised the patient that Dr Vickey Huger would recommend first treating the sleep apnea. Advised that the medications that she is referring to are meds that would not be covered through insurance without her being compliant with PAP therapy. Pt verbalized understanding. She states that she could not tolerate the mask. Advised I would recommend avoiding back along with weight loss and smoking cessation can help

## 2022-08-28 NOTE — Telephone Encounter (Signed)
-----   Message from Melvyn Novas, MD sent at 08/20/2022  5:15 PM EDT ----- The patient reached an AHI of 109/h (!) and finished the study without therapy. 1) Sleep disordered breathing was present: Severe Central dominant Sleep apnea was seen with a total AHI of 64.15/h , cyclic breathing, sparing REM sleep and being worse in supine sleep position. Periodic breathing dominated the night- no prolonged hypoxemia was noted but the 02 nadir was 62%. 2)Sleep efficiency washigh on sleep medication -Total sleep time was 455. . Sleep efficiency was 94.0%. the patient stated she was still very tired in AM> RECOMMENDATIONS: 1) for a patient with central apnea and periodic breathing, there is limited therapy- only BIPAP or ASV to treat the condition. The patient did not tolerate the interface / masks used for any PAP modality and I have no other treatment to offer.  2) central sleep apnea with cyclic breathing is most often associated with changes in cardiac function (CHF), followed by opiate medication, and renal or liver failure. The patient indicated she is a smoker and not motivated to quit.

## 2022-09-01 ENCOUNTER — Encounter: Payer: Self-pay | Admitting: Neurology

## 2022-09-02 DIAGNOSIS — F411 Generalized anxiety disorder: Secondary | ICD-10-CM | POA: Diagnosis not present

## 2022-09-02 DIAGNOSIS — F3132 Bipolar disorder, current episode depressed, moderate: Secondary | ICD-10-CM | POA: Diagnosis not present

## 2022-09-02 DIAGNOSIS — F900 Attention-deficit hyperactivity disorder, predominantly inattentive type: Secondary | ICD-10-CM | POA: Diagnosis not present

## 2022-09-11 ENCOUNTER — Ambulatory Visit (INDEPENDENT_AMBULATORY_CARE_PROVIDER_SITE_OTHER): Payer: BC Managed Care – PPO | Admitting: Adult Health

## 2022-09-16 ENCOUNTER — Ambulatory Visit: Payer: BC Managed Care – PPO | Admitting: Neurology

## 2022-09-17 ENCOUNTER — Ambulatory Visit (INDEPENDENT_AMBULATORY_CARE_PROVIDER_SITE_OTHER): Payer: BC Managed Care – PPO | Admitting: Adult Health

## 2022-09-19 ENCOUNTER — Other Ambulatory Visit (HOSPITAL_COMMUNITY): Payer: Self-pay

## 2022-09-19 MED ORDER — ATOMOXETINE HCL 25 MG PO CAPS
50.0000 mg | ORAL_CAPSULE | Freq: Every day | ORAL | 1 refills | Status: DC
  Filled 2022-09-19: qty 30, 15d supply, fill #0
  Filled 2022-09-19: qty 150, 75d supply, fill #0

## 2022-09-19 MED ORDER — AMPHETAMINE-DEXTROAMPHET ER 30 MG PO CP24
30.0000 mg | ORAL_CAPSULE | Freq: Every day | ORAL | 0 refills | Status: DC
  Filled 2022-09-19 – 2022-09-23 (×3): qty 30, 30d supply, fill #0

## 2022-09-19 MED ORDER — AMPHETAMINE-DEXTROAMPHET ER 20 MG PO CP24
20.0000 mg | ORAL_CAPSULE | Freq: Every day | ORAL | 0 refills | Status: DC
  Filled 2022-09-19 – 2022-09-23 (×3): qty 30, 30d supply, fill #0

## 2022-09-23 ENCOUNTER — Other Ambulatory Visit (HOSPITAL_COMMUNITY): Payer: Self-pay

## 2022-10-13 ENCOUNTER — Ambulatory Visit (INDEPENDENT_AMBULATORY_CARE_PROVIDER_SITE_OTHER): Payer: BC Managed Care – PPO | Admitting: Adult Health

## 2022-10-13 ENCOUNTER — Encounter (INDEPENDENT_AMBULATORY_CARE_PROVIDER_SITE_OTHER): Payer: Self-pay | Admitting: Adult Health

## 2022-10-13 VITALS — BP 117/80 | HR 102 | Temp 98.1°F | Ht 65.0 in | Wt 203.0 lb

## 2022-10-13 DIAGNOSIS — Z6833 Body mass index (BMI) 33.0-33.9, adult: Secondary | ICD-10-CM

## 2022-10-13 DIAGNOSIS — E559 Vitamin D deficiency, unspecified: Secondary | ICD-10-CM

## 2022-10-13 DIAGNOSIS — E669 Obesity, unspecified: Secondary | ICD-10-CM | POA: Diagnosis not present

## 2022-10-13 DIAGNOSIS — E88819 Insulin resistance, unspecified: Secondary | ICD-10-CM | POA: Diagnosis not present

## 2022-10-13 DIAGNOSIS — G4733 Obstructive sleep apnea (adult) (pediatric): Secondary | ICD-10-CM | POA: Diagnosis not present

## 2022-10-14 DIAGNOSIS — G4733 Obstructive sleep apnea (adult) (pediatric): Secondary | ICD-10-CM | POA: Insufficient documentation

## 2022-10-14 NOTE — Progress Notes (Unsigned)
Chief Complaint:   OBESITY Angelica Weeks is here to discuss her progress with her obesity treatment plan along with follow-up of her obesity related diagnoses. Angelica Weeks is on the [employer] and states she is following her eating plan approximately 85% of the time. Angelica Weeks states she is not exercising.   Today's visit was #: 5 Starting weight: 222 lbs Starting date: 05/28/2022 Today's weight: 203 lbs Today's date: 10/13/2022 Total lbs lost to date: 19 lbs Total lbs lost since last in-office visit: 9 lbs  Interim History: She endorses more challenged to follow meal plan due to her husband cooking off plan, pasta, [place] food.  Bioimpedance results, muscle mass -3.2 lbs, adipose mass -5.2 lbs.    Subjective:   1. Insulin resistance Insulin level fluctuates between 4.4-20.8.  Most recent level 9.2 07/01/2022. She has titrated up to 2 tablets of metformin XR 500 mg daily.    2. Vitamin D deficiency 07/01/2022, Vitamin D level 81.0.  She is on OTC multivitamin and OTC magnesium.   3. OSA (obstructive sleep apnea) 09/01/2022 ***  Assessment/Plan:   1. Insulin resistance Managed by PCP. Continue metformin therapy per PCP.   2. Vitamin D deficiency Continue OTC supplementation.  3. OSA (obstructive sleep apnea) Continue with weight loss efforts.   4. Obesity, current BMI 33.9 Handouts:  thanksgiving strategies, holiday recipes.   Angelica Weeks is currently in the action stage of change. As such, her goal is to continue with weight loss efforts. She has agreed to the Category 1 Plan.   Exercise goals:  Walking the dog, [place]  ***  Behavioral modification strategies: increasing lean protein intake, decreasing simple carbohydrates, meal planning and cooking strategies, keeping healthy foods in the home, and planning for success.  Angelica Weeks has agreed to follow-up with our clinic in 3 weeks. She was informed of the importance of frequent follow-up visits to maximize her success  with intensive lifestyle modifications for her multiple health conditions.   Objective:   Blood pressure 117/80, pulse (!) 102, temperature 98.1 F (36.7 C), height 5\' 5"  (1.651 m), weight 203 lb (92.1 kg), SpO2 98 %. Body mass index is 33.78 kg/m.  General: Cooperative, alert, well developed, in no acute distress. HEENT: Conjunctivae and lids unremarkable. Cardiovascular: Regular rhythm.  Lungs: Normal work of breathing. Neurologic: No focal deficits.   Lab Results  Component Value Date   CREATININE 0.78 07/01/2022   BUN 9 07/01/2022   NA 134 07/01/2022   K 4.5 07/01/2022   CL 96 07/01/2022   CO2 22 07/01/2022   Lab Results  Component Value Date   ALT 47 (H) 07/01/2022   AST 33 07/01/2022   ALKPHOS 120 07/01/2022   BILITOT 0.3 07/01/2022   Lab Results  Component Value Date   HGBA1C 5.0 07/01/2022   HGBA1C 5.0 01/22/2022   HGBA1C 5.0 01/22/2021   HGBA1C 4.6 10/10/2019   HGBA1C 4.8 10/06/2018   Lab Results  Component Value Date   INSULIN 9.2 07/01/2022   Lab Results  Component Value Date   TSH 1.120 07/01/2022   Lab Results  Component Value Date   CHOL 169 07/01/2022   HDL 68 07/01/2022   LDLCALC 85 07/01/2022   TRIG 84 07/01/2022   CHOLHDL 2.2 01/22/2022   Lab Results  Component Value Date   VD25OH 81.0 07/01/2022   VD25OH 93 01/22/2022   VD25OH 32 10/10/2019   Lab Results  Component Value Date   WBC 8.0 07/01/2022   HGB 12.8 07/01/2022  HCT 36.4 07/01/2022   MCV 91 07/01/2022   PLT 334 07/01/2022   Lab Results  Component Value Date   IRON 92 01/22/2021   TIBC 391 01/22/2021   Attestation Statements:   Reviewed by clinician on day of visit: allergies, medications, problem list, medical history, surgical history, family history, social history, and previous encounter notes.  I, Malcolm Metro, RMA, am acting as Energy manager for William Hamburger, NP.  I have reviewed the above documentation for accuracy and completeness, and I agree with  the above. -  ***

## 2022-10-15 ENCOUNTER — Other Ambulatory Visit: Payer: Self-pay | Admitting: Nurse Practitioner

## 2022-10-17 ENCOUNTER — Other Ambulatory Visit (HOSPITAL_COMMUNITY): Payer: Self-pay

## 2022-10-17 DIAGNOSIS — F331 Major depressive disorder, recurrent, moderate: Secondary | ICD-10-CM | POA: Diagnosis not present

## 2022-10-17 DIAGNOSIS — Z79899 Other long term (current) drug therapy: Secondary | ICD-10-CM | POA: Diagnosis not present

## 2022-10-17 DIAGNOSIS — F902 Attention-deficit hyperactivity disorder, combined type: Secondary | ICD-10-CM | POA: Diagnosis not present

## 2022-10-17 DIAGNOSIS — F419 Anxiety disorder, unspecified: Secondary | ICD-10-CM | POA: Diagnosis not present

## 2022-10-17 MED ORDER — AMPHETAMINE-DEXTROAMPHET ER 30 MG PO CP24
30.0000 mg | ORAL_CAPSULE | Freq: Two times a day (BID) | ORAL | 0 refills | Status: DC
  Filled 2022-10-17 – 2022-10-20 (×5): qty 60, 30d supply, fill #0

## 2022-10-20 ENCOUNTER — Other Ambulatory Visit (HOSPITAL_COMMUNITY): Payer: Self-pay

## 2022-10-20 MED ORDER — AMPHETAMINE-DEXTROAMPHETAMINE 20 MG PO TABS
20.0000 mg | ORAL_TABLET | Freq: Three times a day (TID) | ORAL | 0 refills | Status: AC
  Filled 2022-10-20: qty 90, 30d supply, fill #0

## 2022-10-21 ENCOUNTER — Other Ambulatory Visit (HOSPITAL_COMMUNITY): Payer: Self-pay

## 2022-10-21 DIAGNOSIS — Z79899 Other long term (current) drug therapy: Secondary | ICD-10-CM | POA: Diagnosis not present

## 2022-11-10 ENCOUNTER — Ambulatory Visit (INDEPENDENT_AMBULATORY_CARE_PROVIDER_SITE_OTHER): Payer: BC Managed Care – PPO | Admitting: Family Medicine

## 2022-11-10 ENCOUNTER — Encounter (INDEPENDENT_AMBULATORY_CARE_PROVIDER_SITE_OTHER): Payer: Self-pay | Admitting: Family Medicine

## 2022-11-10 VITALS — BP 129/83 | HR 102 | Temp 98.0°F | Ht 65.0 in | Wt 201.0 lb

## 2022-11-10 DIAGNOSIS — Z6833 Body mass index (BMI) 33.0-33.9, adult: Secondary | ICD-10-CM | POA: Diagnosis not present

## 2022-11-10 DIAGNOSIS — E559 Vitamin D deficiency, unspecified: Secondary | ICD-10-CM

## 2022-11-10 DIAGNOSIS — E669 Obesity, unspecified: Secondary | ICD-10-CM | POA: Diagnosis not present

## 2022-11-19 NOTE — Progress Notes (Signed)
Chief Complaint:   OBESITY Angelica Weeks is here to discuss her progress with her obesity treatment plan along with follow-up of her obesity related diagnoses. [name] is on the Category 1 Plan and states she is following her eating plan approximately 85% of the time. [name] states she is exercising 0 minutes 0 times per week.  Today's visit was #: 6 Starting weight: 222 lbs Starting date: 05/28/2022 Today's weight: 201 lbs Today's date: 11/10/2022 Total lbs lost to date: 21 lbs Total lbs lost since last in-office visit: 2  Interim History: This is [name]'s first appointment with me.  She has been eating out more frequently.  Recognizes she has been eating salad but that she may not be getting enough protein in.  Initial RMR was 1915 on 05/28/2022.  Able to get all food on category 2.  No upcoming plans for travel or events.  Subjective:   1. Vitamin D deficiency [name] is on vitamin D 10K IUs daily.  Her last vitamin D level of 81. Denies any nausea, vomiting or muscle weakness. She notes fatigue.  Assessment/Plan:   1. Vitamin D deficiency [name] advised to decrease to 10 K IUs every other day.  Will recheck vitamin D in January.  2. Obesity with current BMI of 33.6 [name] is currently in the action stage of change. As such, her goal is to continue with weight loss efforts. She has agreed to the Category 3 Plan.  Increase to category 3 with 6 to 8 ounces at dinner she is to decrease calorie deficient to 400 to 500 cal.  Exercise goals: All adults should avoid inactivity. Some physical activity is better than none, and adults who participate in any amount of physical activity gain some health benefits.  We discussed creating a more structured plan for activity to implement in January.  Behavioral modification strategies: increasing lean protein intake, meal planning and cooking strategies, keeping healthy foods in the home, and planning for success.  [name] has agreed to follow-up  with our clinic in 3 weeks. She was informed of the importance of frequent follow-up visits to maximize her success with intensive lifestyle modifications for her multiple health conditions.   Objective:   Blood pressure 129/83, pulse (!) 102, temperature 98 F (36.7 C), height 5\' 5"  (1.651 m), weight 201 lb (91.2 kg), SpO2 99 %. Body mass index is 33.45 kg/m.  General: Cooperative, alert, well developed, in no acute distress. HEENT: Conjunctivae and lids unremarkable. Cardiovascular: Regular rhythm.  Lungs: Normal work of breathing. Neurologic: No focal deficits.   Lab Results  Component Value Date   CREATININE 0.78 07/01/2022   BUN 9 07/01/2022   NA 134 07/01/2022   K 4.5 07/01/2022   CL 96 07/01/2022   CO2 22 07/01/2022   Lab Results  Component Value Date   ALT 47 (H) 07/01/2022   AST 33 07/01/2022   ALKPHOS 120 07/01/2022   BILITOT 0.3 07/01/2022   Lab Results  Component Value Date   HGBA1C 5.0 07/01/2022   HGBA1C 5.0 01/22/2022   HGBA1C 5.0 01/22/2021   HGBA1C 4.6 10/10/2019   HGBA1C 4.8 10/06/2018   Lab Results  Component Value Date   INSULIN 9.2 07/01/2022   Lab Results  Component Value Date   TSH 1.120 07/01/2022   Lab Results  Component Value Date   CHOL 169 07/01/2022   HDL 68 07/01/2022   LDLCALC 85 07/01/2022   TRIG 84 07/01/2022   CHOLHDL 2.2 01/22/2022   Lab Results  Component Value Date   VD25OH 81.0 07/01/2022   VD25OH 93 01/22/2022   VD25OH 32 10/10/2019   Lab Results  Component Value Date   WBC 8.0 07/01/2022   HGB 12.8 07/01/2022   HCT 36.4 07/01/2022   MCV 91 07/01/2022   PLT 334 07/01/2022   Lab Results  Component Value Date   IRON 92 01/22/2021   TIBC 391 01/22/2021   Attestation Statements:   Reviewed by clinician on day of visit: allergies, medications, problem list, medical history, surgical history, family history, social history, and previous encounter notes.  I, Fortino Sic, RMA am acting as transcriptionist  for Reuben Likes, MD.  I have reviewed the above documentation for accuracy and completeness, and I agree with the above. - Reuben Likes, MD

## 2022-12-02 ENCOUNTER — Ambulatory Visit (INDEPENDENT_AMBULATORY_CARE_PROVIDER_SITE_OTHER): Payer: BC Managed Care – PPO | Admitting: Family Medicine

## 2022-12-02 DIAGNOSIS — F411 Generalized anxiety disorder: Secondary | ICD-10-CM | POA: Diagnosis not present

## 2022-12-02 DIAGNOSIS — F900 Attention-deficit hyperactivity disorder, predominantly inattentive type: Secondary | ICD-10-CM | POA: Diagnosis not present

## 2022-12-02 DIAGNOSIS — F3132 Bipolar disorder, current episode depressed, moderate: Secondary | ICD-10-CM | POA: Diagnosis not present

## 2022-12-22 ENCOUNTER — Encounter: Payer: Self-pay | Admitting: Nurse Practitioner

## 2022-12-22 ENCOUNTER — Ambulatory Visit (INDEPENDENT_AMBULATORY_CARE_PROVIDER_SITE_OTHER): Payer: BC Managed Care – PPO | Admitting: Nurse Practitioner

## 2022-12-22 VITALS — BP 112/68 | HR 94 | Temp 97.7°F | Ht 65.0 in | Wt 199.2 lb

## 2022-12-22 DIAGNOSIS — M25561 Pain in right knee: Secondary | ICD-10-CM

## 2022-12-22 DIAGNOSIS — W19XXXD Unspecified fall, subsequent encounter: Secondary | ICD-10-CM

## 2022-12-22 DIAGNOSIS — R61 Generalized hyperhidrosis: Secondary | ICD-10-CM

## 2022-12-22 MED ORDER — CYCLOBENZAPRINE HCL 10 MG PO TABS: ORAL_TABLET | ORAL | 0 refills | Status: DC

## 2022-12-22 MED ORDER — GLYCOPYRROLATE 1 MG PO TABS: 1.0000 mg | ORAL_TABLET | Freq: Three times a day (TID) | ORAL | 0 refills | Status: DC

## 2022-12-22 NOTE — Progress Notes (Signed)
Assessment and Plan:  Angelica Weeks was seen today for an episodic visit.  Diagnoses and all order for this visit:  Acute pain of right knee RICE Method Start Flexeril as directed. Cautioned SE of drowsiness.  Discussed MRI if s/s fail to improve  - cyclobenzaprine (FLEXERIL) 10 MG tablet; Take 1/2 to 1 tablet 3 x /day as needed for Muscle Spasm  Dispense: 30 tablet; Refill: 0  Fall, subsequent encounter   Hyperhidrosis Start tmt with Robinul Continue to monitor  - glycopyrrolate (ROBINUL) 1 MG tablet; Take 1 tablet (1 mg total) by mouth 3 (three) times daily.  Dispense: 90 tablet; Refill: 0  All medications discussed and reviewed in full. All questions and concerns regarding medications addressed.    Will f/u in 1 month at scheduled CPE.  Otherwise reach out via MyChart.   Notify office for further evaluation and treatment, questions or concerns if s/s fail to improve. The risks and benefits of my recommendations, as well as other treatment options were discussed with the patient today. Questions were answered.  Further disposition pending results of labs. Discussed med's effects and SE's.    Over 20 minutes of exam, counseling, chart review, and critical decision making was performed.   Future Appointments  Date Time Provider [place]  12/22/2022  3:30 PM Darrol Jump, NP [facility] None  12/24/2022  3:00 PM Laqueta Linden, MD [facility] None  01/22/2023  3:00 PM Darrol Jump, NP [facility] None    ------------------------------------------------------------------------------------------------------------------   HPI   52 y.o.female presents for evaluation of right knee pain.  States that she fell approximately 1 month ago.  She does not remember falling on the knee.  Since then she has noticed an achy muscle pain.  Denies numbness, tingling, or arthritis type pain.  Pain is most notable when bending at the knee.  She also stands daily at her job.   Denies swelling, redness, warmth.    She is also concerned for excessive sweating along her face.  Reports this has been an issue since she was a teenager.  She does not report excessive sweating in the underarms or scalp.    Past Medical History:  Diagnosis Date   ADD (attention deficit disorder)    Anxiety    Bipolar 1 disorder, depressed ([place])    Cannabis abuse with psychotic disorder with delusions ([place]) 05/22/2018   Cellulitis of left eyelid 01/2014   Depression    Dysmenorrhea    Paranoid delusion ([place]) 05/22/2018   Schizophrenia ([place])    Vitamin D deficiency      Allergies  Allergen Reactions   Aspirin Other (See Comments)    Burns stomach    Current Outpatient Medications on File Prior to Visit  Medication Sig   ALPRAZolam (XANAX) 1 MG tablet Take 1 mg by mouth at bedtime as needed for anxiety.   amphetamine-dextroamphetamine (ADDERALL XR) 20 MG 24 hr capsule Take 1 capsule (20 mg total) by mouth daily.   amphetamine-dextroamphetamine (ADDERALL) 20 MG tablet Take 1 tablet (20 mg total) by mouth 3 (three) times daily.   atomoxetine (STRATTERA) 25 MG capsule Take 25 mg by mouth daily. Pt takes 50 mg daily   atomoxetine (STRATTERA) 25 MG capsule Take 2 capsules (50 mg total) by mouth daily.   Cyanocobalamin 5000 MCG SUBL Place 1 tablet (5,000 mcg total) under the tongue once a week.   hydrOXYzine (ATARAX) 50 MG tablet Take 50 mg by mouth daily at 12 noon.   lamoTRIgine (LAMICTAL) 200  MG tablet Take 2 tablets (400 mg total) by mouth at bedtime.   Magnesium 500 MG CAPS Take by mouth.   metFORMIN (GLUCOPHAGE-XR) 500 MG 24 hr tablet Take 1 tab daily with largest meal of the day for 4 weeks; if tolerating well increase to twice daily with meals for insulin resistance.   ondansetron (ZOFRAN) 4 MG tablet TAKE 1 TABLET BY MOUTH 3 TIMES A DAY IF NEEDED FOR NAUSEA   pantoprazole (PROTONIX) 40 MG tablet Take 1 tablet (40 mg total) by mouth daily.   traZODone (DESYREL) 100 MG tablet  Take 100 mg by mouth. Takes 200 mg at bedtime.   VITAMIN D PO Take 10,000 Units by mouth daily.   VRAYLAR 6 MG CAPS TAKE 1 CAPSULE BY MOUTH DAILY   No current facility-administered medications on file prior to visit.    ROS: all negative except what is noted in the HPI.   Physical Exam:  LMP  (LMP Unknown) Comment: "I've not had a period in 3-4 years"  General Appearance: NAD.  Awake, conversant and cooperative. Eyes: PERRLA, EOMs intact.  Sclera white.  Conjunctiva without erythema. Sinuses: No frontal/maxillary tenderness.  No nasal discharge. Nares patent.  ENT/Mouth: Ext aud canals clear.  Bilateral TMs w/DOL and without erythema or bulging. Hearing intact.  Posterior pharynx without swelling or exudate.  Tonsils without swelling or erythema.  Neck: Supple.  No masses, nodules or thyromegaly. Respiratory: Effort is regular with non-labored breathing. Breath sounds are equal bilaterally without rales, rhonchi, wheezing or stridor.  Cardio: RRR with no MRGs. Brisk peripheral pulses without edema.  Abdomen: Active BS in all four quadrants.  Soft and non-tender without guarding, rebound tenderness, hernias or masses. Lymphatics: Non tender without lymphadenopathy.  Musculoskeletal: Right knee pain with bending.  Skin: Dewy face.  Appropriate color for ethnicity. Warm without rashes, lesions, ecchymosis, ulcers.  Neuro: CN II-XII grossly normal. Normal muscle tone without cerebellar symptoms and intact sensation.   Psych: AO X 3,  appropriate mood and affect, insight and judgment.    LMP  (LMP Unknown) Comment: "I've not had a period in 3-4 years"  Angelica Glimpse, NP 2:45 PM [facility]

## 2022-12-22 NOTE — Patient Instructions (Signed)
Hyperhidrosis Hyperhidrosis is a condition in which the body sweats a lot more than normal (excessively). Sweating is a necessary function for a human body. It is normal to sweat when you are hot, physically active, or anxious. However, hyperhidrosis is sweating to an excessive degree. Although the condition is not a serious one, it can cause embarrassment. There are two kinds of hyperhidrosis: Primary hyperhidrosis. The sweating usually localizes in one part of your body, such as your underarms, or in a few areas, such as your feet, face, underarms, and hands. This is the more common kind of hyperhidrosis. Secondary hyperhidrosis. This type usually affects your entire body. What are the causes? The cause of this condition depends on the kind of hyperhidrosis that you have. Primary hyperhidrosis may be caused by sweat glands that are more active than normal. Secondary hyperhidrosis may be caused by an underlying condition or by taking certain medicines, such as antidepressants or diabetes medicines. Possible conditions that may cause secondary hyperhidrosis include: Diabetes. Hot flashes during menopause. Certain types of cancers. Obesity. Overactive thyroid (hyperthyroidism). Nervous system injury or disorders. What increases the risk? You are more likely to develop primary hyperhidrosis if you have a family history of the condition. What are the signs or symptoms? Symptoms of this condition include: Feeling like you are sweating constantly, even while you are not being active. Having skin that peels or gets paler or softer in the areas where you sweat the most. Being able to see sweat on your skin. Other symptoms depend on the kind of hyperhidrosis that you have. Symptoms of primary hyperhidrosis may include: Sweating in the same location on both sides of your body. Sweating only during the day and not while you are sleeping. Sweating in specific areas, such as your underarms, palms,  feet, and face. Symptoms of secondary hyperhidrosis may include: Sweating all over your body. Sweating even while you sleep. How is this diagnosed? This condition may be diagnosed by: Medical history. Physical exam. You may also have other tests, including: Tests to measure the amount of sweat you produce and to show the areas where you sweat the most. These tests may involve: Using color-changing chemicals to show patterns of sweating on the skin. Weighing paper that has been applied to the skin. This will show the amount of sweat that your body produces. Measuring the amount of water that evaporates from the skin. Using infrared technology to show patterns of sweating on the skin. Tests to check for other conditions that may be causing excess sweating. These tests may include blood, urine, or imaging tests. How is this treated? Treatment for this condition depends on the kind of hyperhidrosis that you have and the areas of your body that are affected. Your health care provider will also treat any underlying conditions. Treatment may include: Medicines, such as: Antiperspirants. These are medicines that stop sweat. Injectable medicines. These may include small injections of botulinum toxin. Oral medicines. These are taken by mouth to treat underlying conditions and other symptoms. A procedure to: Temporarily turn off the sweat glands in your hands and feet (iontophoresis). Remove your sweat glands. Cut or destroy the nerves so that they do not send a signal to the sweat glands (sympathectomy). Follow these instructions at home: Lifestyle  Limit or avoid foods or beverages that may increase your risk of sweating, such as: Spicy food. Caffeine. Alcohol. Foods that contain monosodium glutamate (MSG). If your feet sweat: Wear sandals when possible. Do not wear cotton socks. Wear socks   that remove or wick moisture from your feet. Wear leather shoes. Avoid wearing the same pair of  shoes for two days in a row. Try placing sweat pads under your clothes to prevent underarm sweat from showing. Keep a journal of your sweat symptoms and when they occur. This may help you identify things that trigger your sweating. General instructions Take over-the-counter and prescription medicines only as told by your health care provider. Use antiperspirants as told by your health care provider. Consider joining a hyperhidrosis support group. Where to find more information American Academy [employer] D[employer]at[employer] Association: MemberVerification.ca Contact a health care provider if: You have new symptoms. Your symptoms get worse. Summary Hyperhidrosis is a condition in which the body sweats a lot more than normal. With primary hyperhidrosis, the sweating usually localizes in one part of your body, such as your underarms, or in a few areas, such as your feet, face, underarms, and hands. It is caused by overactive sweat glands in the affected area. With secondary hyperhidrosis, the sweating affects your entire body. This is caused by an underlying condition or by taking certain medicines. Treatment for this condition depends on the kind of hyperhidrosis that you have and the parts of your body that are affected. This information is not intended to replace advice given to you by your health care provider. Make sure you discuss any questions you have with your health care provider. Document Revised: 01/28/2022 Document Reviewed: 01/28/2022 Elsevier Patient Education  [place].

## 2022-12-24 ENCOUNTER — Ambulatory Visit (INDEPENDENT_AMBULATORY_CARE_PROVIDER_SITE_OTHER): Payer: BC Managed Care – PPO | Admitting: Family Medicine

## 2022-12-24 ENCOUNTER — Encounter (INDEPENDENT_AMBULATORY_CARE_PROVIDER_SITE_OTHER): Payer: Self-pay | Admitting: Family Medicine

## 2022-12-24 VITALS — BP 108/70 | HR 119 | Temp 98.5°F | Ht 65.0 in | Wt 194.0 lb

## 2022-12-24 DIAGNOSIS — Z6832 Body mass index (BMI) 32.0-32.9, adult: Secondary | ICD-10-CM

## 2022-12-24 DIAGNOSIS — E669 Obesity, unspecified: Secondary | ICD-10-CM | POA: Diagnosis not present

## 2022-12-24 DIAGNOSIS — G4733 Obstructive sleep apnea (adult) (pediatric): Secondary | ICD-10-CM | POA: Diagnosis not present

## 2022-12-31 ENCOUNTER — Other Ambulatory Visit: Payer: Self-pay | Admitting: Nurse Practitioner

## 2022-12-31 DIAGNOSIS — M25561 Pain in right knee: Secondary | ICD-10-CM

## 2023-01-05 ENCOUNTER — Other Ambulatory Visit: Payer: Self-pay | Admitting: Nurse Practitioner

## 2023-01-13 ENCOUNTER — Ambulatory Visit (INDEPENDENT_AMBULATORY_CARE_PROVIDER_SITE_OTHER): Payer: BC Managed Care – PPO | Admitting: Adult Health

## 2023-01-13 ENCOUNTER — Encounter: Payer: Self-pay | Admitting: Adult Health

## 2023-01-13 VITALS — BP 116/76 | HR 87 | Temp 98.1°F | Ht 65.0 in | Wt 200.0 lb

## 2023-01-13 DIAGNOSIS — G4733 Obstructive sleep apnea (adult) (pediatric): Secondary | ICD-10-CM

## 2023-01-13 DIAGNOSIS — G4710 Hypersomnia, unspecified: Secondary | ICD-10-CM

## 2023-01-13 NOTE — Progress Notes (Signed)
@Patient  ID: Angelica Weeks, female    DOB: 01-17-1971, 52 y.o.   MRN: [id number]    Referring provider: Laqueta Linden, MD  HPI: 52 year old female seen for sleep consult January 13, 2023 to establish for sleep apnea  TEST/EVENTS :  Sleep study August 05, 2022 AHI 64.3/hour (126.4/hour in supine position), SpO2 low at 62%, central sleep apnea was noted, cheyne stokes breathing - recommended trial of BIPAP or ASV.   01/13/23 Sleep consult  Patient presents for a sleep consult today.  Kindly referred by Mina Marble, NP .  Patient is here for a second opinion for sleep apnea.  Patient was seen by neurology with Dr. Brett Fairy set up for a sleep study done on August 05, 2022 that showed severe sleep apnea with AHI at 64.3/hour, 126.4/hour in supine position and SpO2 low at 62%.  Central sleep apneic events were noted.  Patient was recommended to begin CPAP therapy.  However wanted to see if there was an alternative for CPAP.  We discussed her sleep study in detail and patient education was given on untreated sleep apnea potential complications along with potential complications of nocturnal hypoxemia.  Also went over in detail how CPAP, BiPAP and noninvasive sleep devices can help with underlying sleep apnea. Patient typically goes to bed about 8 PM.  Takes about 30 minutes to go to sleep.  Is up 3 or more times at night.  Gets up at 6:30 AM.  Patient's weight is down 30 pounds over the last 2 years.  Current weight is at 200 pounds with a BMI at 33.  Patient says she is not sure if she will be able to wear a mask as she has claustrophobia.  She also has underlying anxiety depression and bipolar disorder.  Patient says she typically does not nap.  She has no history of known trauma.  No symptoms suspicious for cataplexy or sleep paralysis.  No known history of congestive heart failure.  She does have ADD and is on Strattera.  She does take trazodone, Lamictal, Robinul and Vraylar.  Epworth  score is 5 out of 24.  Typically gets sleepy if she sits down to watch TV and in the evening hours.  Social history patient is single.  Patient has children.  She smokes 1 pack/day.  Denies any current alcohol.  No current illegal drug use.  Family history none  Surgical history none  Allergies  Allergen Reactions   Aspirin Other (See Comments)    Burns stomach    Immunization History  Administered Date(s) Administered   PFIZER(Purple Top)SARS-COV-2 Vaccination 04/13/2020, 07/04/2020   PPD Test 02/13/2015, 04/13/2017   Pneumococcal Polysaccharide-23 10/06/2018   Tdap 02/13/2015    Past Medical History:  Diagnosis Date   ADD (attention deficit disorder)    Anxiety    Bipolar 1 disorder, depressed ([place])    Cannabis abuse with psychotic disorder with delusions ([place]) 05/22/2018   Cellulitis of left eyelid 01/2014   Depression    Dysmenorrhea    Paranoid delusion ([place]) 05/22/2018   Schizophrenia ([place])    Vitamin D deficiency     Tobacco History: Social History   Tobacco Use  Smoking Status Every Day   Packs/day: 1.00   Years: 33.00   Total pack years: 33.00   Types: Cigarettes   Start date: 24   Passive exposure: Current  Smokeless Tobacco Never   Ready to quit: Not Answered Counseling given: Not Answered   Outpatient Medications Prior to Visit  Medication Sig Dispense Refill   ALPRAZolam (XANAX) 1 MG tablet Take 1 mg by mouth at bedtime as needed for anxiety.     amphetamine-dextroamphetamine (ADDERALL) 20 MG tablet Take 1 tablet (20 mg total) by mouth 3 (three) times daily. 90 tablet 0   atomoxetine (STRATTERA) 25 MG capsule Take 2 capsules (50 mg total) by mouth daily. 180 capsule 1   Cyanocobalamin 5000 MCG SUBL Place 1 tablet (5,000 mcg total) under the tongue once a week.     glycopyrrolate (ROBINUL) 1 MG tablet Take 1 tablet (1 mg total) by mouth 3 (three) times daily. 90 tablet 0   lamoTRIgine (LAMICTAL) 200 MG tablet Take 2 tablets (400 mg total) by  mouth at bedtime.     Magnesium 500 MG CAPS Take by mouth.     metFORMIN (GLUCOPHAGE-XR) 500 MG 24 hr tablet Take 1 tab daily with largest meal of the day for 4 weeks; if tolerating well increase to twice daily with meals for insulin resistance. 180 tablet 3   ondansetron (ZOFRAN) 4 MG tablet TAKE 1 TABLET BY MOUTH 3 TIMES A DAY IF NEEDED FOR NAUSEA 30 tablet 0   traZODone (DESYREL) 100 MG tablet Take 100 mg by mouth. Takes 200 mg at bedtime.     VITAMIN D PO Take 250 mcg by mouth daily.     VRAYLAR 6 MG CAPS TAKE 1 CAPSULE BY MOUTH DAILY 30 capsule 3   amphetamine-dextroamphetamine (ADDERALL XR) 20 MG 24 hr capsule Take 1 capsule (20 mg total) by mouth daily. 30 capsule 0   atomoxetine (STRATTERA) 25 MG capsule Take 25 mg by mouth daily. Pt takes 50 mg daily     cyclobenzaprine (FLEXERIL) 10 MG tablet Take 1/2 to 1 tablet 3 x /day as needed for Muscle Spasm 30 tablet 0   hydrOXYzine (ATARAX) 50 MG tablet Take 50 mg by mouth daily at 12 noon.     pantoprazole (PROTONIX) 40 MG tablet Take 1 tablet (40 mg total) by mouth daily. 90 tablet 1   No facility-administered medications prior to visit.     Review of Systems:   Constitutional:   No  weight loss, night sweats,  Fevers, chills, fatigue, or  lassitude.  HEENT:   No headaches,  Difficulty swallowing,  Tooth/dental problems, or  Sore throat,                No sneezing, itching, ear ache, nasal congestion, post nasal drip,   CV:  No chest pain,  Orthopnea, PND, swelling in lower extremities, anasarca, dizziness, palpitations, syncope.   GI  No heartburn, indigestion, abdominal pain, nausea, vomiting, diarrhea, change in bowel habits, loss of appetite, bloody stools.   Resp: No shortness of breath with exertion or at rest.  No excess mucus, no productive cough,  No non-productive cough,  No coughing up of blood.  No change in color of mucus.  No wheezing.  No chest wall deformity  Skin: no rash or lesions.  GU: no dysuria, change in  color of urine, no urgency or frequency.  No flank pain, no hematuria   MS:  No joint pain or swelling.  No decreased range of motion.  No back pain.    Physical Exam  BP 116/76 (BP Location: Left Arm, Patient Position: Sitting, Cuff Size: Large)   Pulse 87   Temp 98.1 F (36.7 C) (Oral)   Ht 5\' 5"  (1.651 m)   Wt 200 lb (90.7 kg)   LMP  (LMP Unknown) Comment: "  I've not had a period in 3-4 years"  SpO2 98% Comment: RA  BMI 33.28 kg/m   GEN: A/Ox3; pleasant , NAD, well nourished    HEENT:  [place]/AT,  EACs-clear, TMs-wnl, NOSE-clear, THROAT-clear, no lesions, no postnasal drip or exudate noted.   NECK:  Supple w/ fair ROM; no JVD; normal carotid impulses w/o bruits; no thyromegaly or nodules palpated; no lymphadenopathy.    RESP  Clear  P & A; w/o, wheezes/ rales/ or rhonchi. no accessory muscle use, no dullness to percussion  CARD:  RRR, no m/r/g, no peripheral edema, pulses intact, no cyanosis or clubbing.  GI:   Soft & nt; nml bowel sounds; no organomegaly or masses detected.   Musco: Warm bil, no deformities or joint swelling noted.   Neuro: alert, no focal deficits noted.    Skin: Warm, no lesions or rashes    Lab Results:  CBC    Component Value Date/Time   WBC 8.0 07/01/2022 1533   WBC 6.8 01/22/2022 0956   RBC 3.99 07/01/2022 1533   RBC 4.71 01/22/2022 0956   HGB 12.8 07/01/2022 1533   HCT 36.4 07/01/2022 1533   PLT 334 07/01/2022 1533   MCV 91 07/01/2022 1533   MCH 32.1 07/01/2022 1533   MCH 30.6 01/22/2022 0956   MCHC 35.2 07/01/2022 1533   MCHC 33.3 01/22/2022 0956   RDW 14.1 07/01/2022 1533   LYMPHSABS 2.4 07/01/2022 1533   MONOABS 420 04/13/2017 1117   EOSABS 0.2 07/01/2022 1533   BASOSABS 0.0 07/01/2022 1533    BMET    Component Value Date/Time   NA 134 07/01/2022 1533   K 4.5 07/01/2022 1533   CL 96 07/01/2022 1533   CO2 22 07/01/2022 1533   GLUCOSE 97 07/01/2022 1533   GLUCOSE 84 01/22/2022 0956   BUN 9 07/01/2022 1533   CREATININE  0.78 07/01/2022 1533   CREATININE 0.95 01/22/2022 0956   CALCIUM 9.3 07/01/2022 1533   GFRNONAA 73 01/22/2021 0951   GFRAA 85 01/22/2021 0951    BNP No results found for: "BNP"  ProBNP No results found for: "PROBNP"  Imaging: No results found.        No data to display          No results found for: "NITRICOXIDE"      Assessment & Plan:   No problem-specific Assessment & Plan notes found for this encounter.     Rexene Edison, NP 01/13/2023

## 2023-01-13 NOTE — Patient Instructions (Signed)
Set up for BIPAP titration study.  Healthy sleep regimen  Work on healthy weight loss  Follow up with Dr. Ander Slade or Sakari Raisanen NP in 4-6 weeks to discuss results and treatment plan

## 2023-01-14 ENCOUNTER — Other Ambulatory Visit: Payer: Self-pay | Admitting: Nurse Practitioner

## 2023-01-14 DIAGNOSIS — R61 Generalized hyperhidrosis: Secondary | ICD-10-CM

## 2023-01-16 NOTE — Assessment & Plan Note (Signed)
Severe sleep apnea noted on sleep study August 05, 2022 with AHI at 64.3/hour positive central events and Cheyne-Stokes breathing.  Patient was recommended for trial of CPAP or BiPAP or ASV . Would recommend a in lab titration study.  Will set up for a BiPAP titration study.  Long discussion with patient regarding sleep apnea, nocturnal hypoxemia and how CPAP and BiPAP devices can help.  She is willing to go forward with a BiPAP titration study.  Plan Patient Instructions  Set up for BIPAP titration study.  Healthy sleep regimen  Work on healthy weight loss  Follow up with Dr. Ander Slade or Claude Waldman NP in 4-6 weeks to discuss results and treatment plan

## 2023-01-16 NOTE — Assessment & Plan Note (Signed)
Suspect is multifactorial with underlying severe sleep apnea that is untreated.  Will set up for a titration study and then once treatment is started then reevaluate.

## 2023-01-21 ENCOUNTER — Ambulatory Visit (INDEPENDENT_AMBULATORY_CARE_PROVIDER_SITE_OTHER): Payer: BC Managed Care – PPO | Admitting: Family Medicine

## 2023-01-21 DIAGNOSIS — F419 Anxiety disorder, unspecified: Secondary | ICD-10-CM | POA: Diagnosis not present

## 2023-01-21 DIAGNOSIS — F902 Attention-deficit hyperactivity disorder, combined type: Secondary | ICD-10-CM | POA: Diagnosis not present

## 2023-01-21 DIAGNOSIS — Z79899 Other long term (current) drug therapy: Secondary | ICD-10-CM | POA: Diagnosis not present

## 2023-01-21 DIAGNOSIS — F331 Major depressive disorder, recurrent, moderate: Secondary | ICD-10-CM | POA: Diagnosis not present

## 2023-01-21 NOTE — Progress Notes (Unsigned)
Complete Physical  Assessment and Plan:  Angelica Weeks was seen today for annual exam. Diagnoses and all orders for this visit:  Encounter for Annual Physical Exam with abnormal findings Due annually  Health Maintenance reviewed Healthy lifestyle reviewed and goals set - Schedule GYN, mammogram, colonoscopy   Gastroesophageal reflux disease, unspecified whether esophagitis present Frequent breakthrough; try famotidine 40 mg at night Follow up if not improving in 2 weeks -     TSH -     Magnesium  Vitamin D deficiency Continue supplementation -     VITAMIN D 25 Hydroxy (Vit-D Deficiency)  Bipolar 1 disorder, depressed (HCC) Recurrent major depressive disorder, in partial remission (Angelica Weeks) Follows with Dr Angelica Weeks Taking Vraylar and lamictal Discussed distraction: increase walking, walking dog, exercise -     CBC with Differential/Platelet -     COMPLETE METABOLIC PANEL WITH GFR  Attention deficit disorder (ADD) without hyperactivity Doing well at this time Psych managing adderall   Smoker unmotivated to quit One pack a day Discussed smoking cessation Not ready to quit at this time  History of cannabis dependence/abuse ([place]) Denies any current use           Obesity - BMI 36 Long discussion about weight loss, diet, and exercise Recommended diet heavy in fruits and veggies and low in animal meats, cheeses, and dairy products, appropriate calorie intake Increase mounjaro to 15 mg Patient will work on increasing protein intake with each meal; protein handout given and reviewed Discussed appropriate weight for height  Follow up in 3 months   History of elevated prolactin Had normal MRI, no checks in last 2 years  Denies nipple discharge - prolactin       Screening cholesterol level -     Lipid panel  Screening for thyroid disorder -     TSH  Screening for diabetes mellitus -     Hemoglobin A1c -     Insulin, random  Screening for blood or protein in urine -      Urinalysis w microscopic -     Microalbumin   Screening for cardiovascular condition -     EKG 12-Lead  B12 deficiency - on supplement, recheck levels  Colon cancer screening Adopted, doesn't know family history, GI referral placed for colonoscopy after discussion.   Hyperhydrosis  OSA Follows Angelica Weeks Pulmonology Sleep study 08/05/2202 with centraol sleep apnea, Sp)2 62%, cheyne stokes breathing.  Recommended trial of BIPAP or ASV.    No orders of the defined types were placed in this encounter.     Discussed med's effects and SE's. Screening labs and tests as requested with regular follow-up as recommended. Over 40 minutes of interview, exam, counseling, chart review, and complex, high level critical decision making was performed this visit.   Future Appointments  Date Time Provider Angelica Weeks  01/22/2023  3:00 PM Darrol Jump, NP [facility] None  01/26/2023  3:00 PM Danford, Berna Spare, NP [facility] None  02/17/2023  3:00 PM Parrett, Fonnie Mu, NP [facility] None     HPI  52 y.o. female  presents for a complete physical and follow up for has ADD (attention deficit disorder); Bipolar 1 disorder, depressed (Angelica Weeks); GERD (gastroesophageal reflux disease); Smoker unmotivated to quit; Obesity (BMI 30-39.9); Elevated prolactin level; Insulin resistance; Fatigue; Menopause; Metabolic syndrome; Moderate recurrent major depression (Angelica Weeks); Sleep disturbance; Daytime somnolence; Elevated fasting blood sugar; Vitamin D deficiency; B12 deficiency; Hypersomnia, persistent; Fatigue due to depression; Insomnia due to other mental disorder; Nocturia; Snoring; Class 2 drug-induced  obesity with serious comorbidity and body mass index (BMI) of 38.0 to 38.9 in adult; and OSA (obstructive sleep apnea) on their problem list..  She is with long term partner, 2 kids 23 and 74.  She is a Materials engineer.   She is due to see GYN, last 12/2018, Dr. Hale Bogus. She has   She has  been having GERD, not  controlled by tums, ranitidine worked well prior to being taken off the market.   She is a current smoker and smokes a pack a day since 1990, thinking about about reducing but not ready, fear of weight gain and admits to habitual use. Has failed chantix and wellbutrin in the past.   She is seeing Angelica Good, MD for bipolar depression, ADD, anxiety.  Bipolar currently doing well on vraylar and lamictal, adderall XR for ADD, and PRN xanax. She is on trazodone in the evening. She reports doing much better with current regimen though does report ongoing fatigue.   She has hx of elevated prolactin up to 25 in 12/17/2018, had normal MRI 01/15/2019.   BMI is There is no height or weight on file to calculate BMI., she has been working on diet and exercise. Walks with her dog, just restartedCut out alcohol, eating a lot of salad, coffee with no sugar, drinking only water. No bread, rare pasta.  She has gained about 65 lb since 2020, was stable around 160 lb, most likely due to antipsychotic.  Insurance doesn't cover weight loss meds, was started on mounjaro with coupon by another provider, recently on 10 mg/week, would like to increase to 15 mg.  Wt Readings from Last 3 Encounters:  01/13/23 200 lb (90.7 kg)  12/24/22 194 lb (88 kg)  12/22/22 199 lb 3.2 oz (90.4 kg)   Her blood pressure has been controlled at home, today their BP is   She does workout. She denies chest pain, shortness of breath, dizziness.   She is not on cholesterol medication and denies myalgias. Her cholesterol is at goal. The cholesterol last visit was:   Lab Results  Component Value Date   CHOL 169 07/01/2022   HDL 68 07/01/2022   LDLCALC 85 07/01/2022   TRIG 84 07/01/2022   CHOLHDL 2.2 01/22/2022   She has been working on diet and exercise for glucose management, she is on mounjaro 10 mg/week, she is not on bASA, she is not on ACE/ARB and denies increased appetite, nausea, paresthesia of the feet, polydipsia, polyuria, visual  disturbances, vomiting and weight loss. Last A1C in the office was:  Lab Results  Component Value Date   HGBA1C 5.0 07/01/2022   Last GFR: Lab Results  Component Value Date   EGFR 93 07/01/2022    Patient is on Vitamin D supplement, taking 10000 IU Lab Results  Component Value Date   VD25OH 81.0 07/01/2022     She reports newly on B12 supplement -  Lab Results  Component Value Date   VITAMINB12 1,946 (H) 07/01/2022     Current Medications:  Current Outpatient Medications on File Prior to Visit  Medication Sig Dispense Refill   ALPRAZolam (XANAX) 1 MG tablet Take 1 mg by mouth at bedtime as needed for anxiety.     amphetamine-dextroamphetamine (ADDERALL) 20 MG tablet Take 1 tablet (20 mg total) by mouth 3 (three) times daily. 90 tablet 0   atomoxetine (STRATTERA) 25 MG capsule Take 2 capsules (50 mg total) by mouth daily. 180 capsule 1   Cyanocobalamin 5000 MCG SUBL  Place 1 tablet (5,000 mcg total) under the tongue once a week.     glycopyrrolate (ROBINUL) 1 MG tablet TAKE 1 TABLET BY MOUTH 3 TIMES DAILY. 270 tablet 1   lamoTRIgine (LAMICTAL) 200 MG tablet Take 2 tablets (400 mg total) by mouth at bedtime.     Magnesium 500 MG CAPS Take by mouth.     metFORMIN (GLUCOPHAGE-XR) 500 MG 24 hr tablet Take 1 tab daily with largest meal of the day for 4 weeks; if tolerating well increase to twice daily with meals for insulin resistance. 180 tablet 3   ondansetron (ZOFRAN) 4 MG tablet TAKE 1 TABLET BY MOUTH 3 TIMES A DAY IF NEEDED FOR NAUSEA 30 tablet 0   traZODone (DESYREL) 100 MG tablet Take 100 mg by mouth. Takes 200 mg at bedtime.     VITAMIN D PO Take 250 mcg by mouth daily.     VRAYLAR 6 MG CAPS TAKE 1 CAPSULE BY MOUTH DAILY 30 capsule 3   No current facility-administered medications on file prior to visit.   Allergies:  Allergies  Allergen Reactions   Aspirin Other (See Comments)    Burns stomach   Medical History:  She has ADD (attention deficit disorder); Bipolar 1  disorder, depressed (HCC); GERD (gastroesophageal reflux disease); Smoker unmotivated to quit; Obesity (BMI 30-39.9); Elevated prolactin level; Insulin resistance; Fatigue; Menopause; Metabolic syndrome; Moderate recurrent major depression (HCC); Sleep disturbance; Daytime somnolence; Elevated fasting blood sugar; Vitamin D deficiency; B12 deficiency; Hypersomnia, persistent; Fatigue due to depression; Insomnia due to other mental disorder; Nocturia; Snoring; Class 2 drug-induced obesity with serious comorbidity and body mass index (BMI) of 38.0 to 38.9 in adult; and OSA (obstructive sleep apnea) on their problem list.  Health Maintenance:   Immunization History  Administered Date(s) Administered   PFIZER(Purple Top)SARS-COV-2 Vaccination 04/13/2020, 07/04/2020   PPD Test 02/13/2015, 04/13/2017   Pneumococcal Polysaccharide-23 10/06/2018   Tdap 02/13/2015   Health Maintenance  Topic Date Due   COLONOSCOPY (Pts 45-63yrs Insurance coverage will need to be confirmed)  Never done   COVID-19 Vaccine (3 - Pfizer risk series) 08/01/2020   PAP SMEAR-Modifier  12/17/2021   INFLUENZA VACCINE  Never done   MAMMOGRAM  03/21/2023   DTaP/Tdap/Td (2 - Td or Tdap) 02/12/2025   HPV VACCINES  Aged Out   Hepatitis C Screening  Discontinued   HIV Screening  Discontinued    Influenza: Declines  Pneumococcal: 2019-smoker  Last colonoscopy: DUE - GI referral placed  Last mammogram: never, order placed for breast center Last pap smear/pelvic exam: 12/2018 - postmenopausal, Ammenorrhea 3-4years. Dr Hyacinth Meeker    Names of Other Physician/Practitioners you currently use: 1. [facility] here for primary care 2. Eye Exam: last 2022, goes annually  3. Dental Exam [facility], last 2022, going q35m   Patient Care Team: [name], MD as PCP - General (Internal Medicine) Sallye Lat, MD as Consulting Physician (Optometry) Mitchel Honour, DO as  Consulting Physician (Obstetrics and Gynecology) Andee Poles, MD as Consulting Physician (Psychiatry) Jerene Bears, MD as Consulting Physician (Obstetrics and Gynecology)  Surgical History:  She has a past surgical history that includes Wisdom tooth extraction. Family History:  HerShe was adopted. Family history is unknown by patient. Social History:  She reports that she has been smoking cigarettes. She started smoking about 34 years ago. She has a 33.00 pack-year smoking history. She has been exposed to tobacco smoke. She has never used smokeless tobacco. She reports that she does  not currently use alcohol. She reports that she does not currently use drugs after having used the following drugs: Marijuana.  Review of Systems: Review of Systems  Constitutional:  Positive for malaise/fatigue (chronic). Negative for weight loss.  HENT:  Negative for hearing loss, sore throat and tinnitus.   Eyes:  Negative for blurred vision and double vision.  Respiratory:  Negative for cough, sputum production, shortness of breath and wheezing.   Cardiovascular:  Negative for chest pain, palpitations, orthopnea, claudication, leg swelling and PND.  Gastrointestinal:  Positive for heartburn (worse at night). Negative for abdominal pain, blood in stool, constipation, diarrhea, melena, nausea and vomiting.  Genitourinary: Negative.   Musculoskeletal:  Negative for falls, joint pain and myalgias.  Skin:  Negative for rash.  Neurological:  Negative for dizziness, tingling, sensory change, weakness and headaches.  Endo/Heme/Allergies:  Negative for polydipsia.  Psychiatric/Behavioral:  Negative for depression (improved), memory loss, substance abuse and suicidal ideas. The patient is not nervous/anxious and does not have insomnia.   All other systems reviewed and are negative.   Physical Exam: Estimated body mass index is 33.28 kg/m as calculated from the following:   Height as of 01/13/23: 5\' 5"   (1.651 m).   Weight as of 01/13/23: 200 lb (90.7 kg). LMP  (LMP Unknown) Comment: "I've not had a period in 3-4 years" General Appearance: Well nourished, in no apparent distress.  Eyes: PERRLA, EOMs, conjunctiva no swelling or erythema Sinuses: No Frontal/maxillary tenderness  ENT/Mouth: Ext aud canals clear, normal TM bilaterally. Weeks dentition. No erythema, swelling, or exudate on post pharynx. Tonsils not swollen or erythematous. Hearing normal.  Neck: Supple, thyroid normal. No bruits  Respiratory: Respiratory effort normal, BS equal bilaterally without rales, rhonchi, wheezing or stridor.  Cardio: RRR without murmurs, rubs or gallops. Brisk peripheral pulses without edema.  Chest: symmetric, with normal excursions and percussion.  Breasts: breasts appear normal, no suspicious masses, no skin or nipple changes or axillary nodes. Abdomen: Soft, obese, nontender, no guarding, rebound, hernias, masses, or organomegaly.  Lymphatics: Non tender without lymphadenopathy.  Genitourinary: Defer to GYN, no concerns Musculoskeletal: Full ROM all peripheral extremities,5/5 strength, and normal gait.  Skin: Warm, Angelica without rashes, lesions, ecchymosis. Neuro: Cranial nerves intact, reflexes equal bilaterally. Normal muscle tone, no cerebellar symptoms. Sensation intact.  Psych: Awake and oriented X 3, normal affect, Insight and Judgment appropriate.    EKG: Sinus tachycardia  Darrol Jump, NP 10:21 AM [facility]

## 2023-01-22 ENCOUNTER — Encounter: Payer: Self-pay | Admitting: Nurse Practitioner

## 2023-01-22 ENCOUNTER — Ambulatory Visit (INDEPENDENT_AMBULATORY_CARE_PROVIDER_SITE_OTHER): Payer: BC Managed Care – PPO | Admitting: Nurse Practitioner

## 2023-01-22 VITALS — BP 110/70 | HR 106 | Temp 97.3°F | Ht 65.0 in | Wt 197.0 lb

## 2023-01-22 DIAGNOSIS — Z79899 Other long term (current) drug therapy: Secondary | ICD-10-CM

## 2023-01-22 DIAGNOSIS — Z1389 Encounter for screening for other disorder: Secondary | ICD-10-CM | POA: Diagnosis not present

## 2023-01-22 DIAGNOSIS — Z13220 Encounter for screening for lipoid disorders: Secondary | ICD-10-CM | POA: Diagnosis not present

## 2023-01-22 DIAGNOSIS — R61 Generalized hyperhidrosis: Secondary | ICD-10-CM

## 2023-01-22 DIAGNOSIS — E559 Vitamin D deficiency, unspecified: Secondary | ICD-10-CM

## 2023-01-22 DIAGNOSIS — F17200 Nicotine dependence, unspecified, uncomplicated: Secondary | ICD-10-CM

## 2023-01-22 DIAGNOSIS — Z136 Encounter for screening for cardiovascular disorders: Secondary | ICD-10-CM

## 2023-01-22 DIAGNOSIS — G4733 Obstructive sleep apnea (adult) (pediatric): Secondary | ICD-10-CM

## 2023-01-22 DIAGNOSIS — Z0000 Encounter for general adult medical examination without abnormal findings: Secondary | ICD-10-CM

## 2023-01-22 DIAGNOSIS — Z1329 Encounter for screening for other suspected endocrine disorder: Secondary | ICD-10-CM

## 2023-01-22 DIAGNOSIS — Z0001 Encounter for general adult medical examination with abnormal findings: Secondary | ICD-10-CM

## 2023-01-22 DIAGNOSIS — W1840XA Slipping, tripping and stumbling without falling, unspecified, initial encounter: Secondary | ICD-10-CM

## 2023-01-22 DIAGNOSIS — R7989 Other specified abnormal findings of blood chemistry: Secondary | ICD-10-CM

## 2023-01-22 DIAGNOSIS — Z1231 Encounter for screening mammogram for malignant neoplasm of breast: Secondary | ICD-10-CM

## 2023-01-22 DIAGNOSIS — K219 Gastro-esophageal reflux disease without esophagitis: Secondary | ICD-10-CM

## 2023-01-22 DIAGNOSIS — Z131 Encounter for screening for diabetes mellitus: Secondary | ICD-10-CM | POA: Diagnosis not present

## 2023-01-22 DIAGNOSIS — I10 Essential (primary) hypertension: Secondary | ICD-10-CM | POA: Diagnosis not present

## 2023-01-22 DIAGNOSIS — F319 Bipolar disorder, unspecified: Secondary | ICD-10-CM

## 2023-01-22 DIAGNOSIS — Z1211 Encounter for screening for malignant neoplasm of colon: Secondary | ICD-10-CM

## 2023-01-22 DIAGNOSIS — E661 Drug-induced obesity: Secondary | ICD-10-CM

## 2023-01-22 DIAGNOSIS — F988 Other specified behavioral and emotional disorders with onset usually occurring in childhood and adolescence: Secondary | ICD-10-CM

## 2023-01-22 DIAGNOSIS — E538 Deficiency of other specified B group vitamins: Secondary | ICD-10-CM

## 2023-01-22 DIAGNOSIS — G8929 Other chronic pain: Secondary | ICD-10-CM

## 2023-01-22 DIAGNOSIS — R296 Repeated falls: Secondary | ICD-10-CM

## 2023-01-22 DIAGNOSIS — F1221 Cannabis dependence, in remission: Secondary | ICD-10-CM

## 2023-01-22 MED ORDER — METHOCARBAMOL 750 MG PO TABS: 750.0000 mg | ORAL_TABLET | Freq: Two times a day (BID) | ORAL | 0 refills | Status: DC

## 2023-01-23 LAB — COMPLETE METABOLIC PANEL WITH GFR
AG Ratio: 2.2 (calc) (ref 1.0–2.5)
ALT: 12 U/L (ref 6–29)
AST: 14 U/L (ref 10–35)
Albumin: 4.6 g/dL (ref 3.6–5.1)
Alkaline phosphatase (APISO): 88 U/L (ref 31–125)
BUN: 20 mg/dL (ref 7–25)
CO2: 28 mmol/L (ref 20–32)
Calcium: 9.9 mg/dL (ref 8.6–10.2)
Chloride: 101 mmol/L (ref 98–110)
Creat: 0.88 mg/dL (ref 0.50–0.99)
Globulin: 2.1 g/dL (calc) (ref 1.9–3.7)
Glucose, Bld: 88 mg/dL (ref 65–99)
Potassium: 4.2 mmol/L (ref 3.5–5.3)
Sodium: 138 mmol/L (ref 135–146)
Total Bilirubin: 0.2 mg/dL (ref 0.2–1.2)
Total Protein: 6.7 g/dL (ref 6.1–8.1)
eGFR: 81 mL/min/{1.73_m2} (ref >=60)

## 2023-01-23 LAB — URINALYSIS, ROUTINE W REFLEX MICROSCOPIC
Bilirubin Urine: NEGATIVE
Glucose, UA: NEGATIVE
Hgb urine dipstick: NEGATIVE
Ketones, ur: NEGATIVE
Leukocytes,Ua: NEGATIVE
Nitrite: NEGATIVE
Protein, ur: NEGATIVE
Specific Gravity, Urine: 1.017 (ref 1.001–1.035)
pH: <=5 (ref 5.0–8.0)

## 2023-01-23 LAB — CBC WITH DIFFERENTIAL/PLATELET
Absolute Monocytes: 681 cells/uL (ref 200–950)
Basophils Absolute: 50 cells/uL (ref 0–200)
Basophils Relative: 0.6 %
Eosinophils Absolute: 91 cells/uL (ref 15–500)
Eosinophils Relative: 1.1 %
HCT: 37.7 % (ref 35.0–45.0)
Hemoglobin: 13 g/dL (ref 11.7–15.5)
Lymphs Abs: 2747 cells/uL (ref 850–3900)
MCH: 32.3 pg (ref 27.0–33.0)
MCHC: 34.5 g/dL (ref 32.0–36.0)
MCV: 93.8 fL (ref 80.0–100.0)
MPV: 9 fL (ref 7.5–12.5)
Monocytes Relative: 8.2 %
Neutro Abs: 4731 cells/uL (ref 1500–7800)
Neutrophils Relative %: 57 %
Platelets: 303 10*3/uL (ref 140–400)
RBC: 4.02 10*6/uL (ref 3.80–5.10)
RDW: 12.6 % (ref 11.0–15.0)
Total Lymphocyte: 33.1 %
WBC: 8.3 10*3/uL (ref 3.8–10.8)

## 2023-01-23 LAB — LIPID PANEL
Cholesterol: 144 mg/dL (ref <200)
HDL: 65 mg/dL (ref >=50)
LDL Cholesterol (Calc): 61 mg/dL (calc)
Non-HDL Cholesterol (Calc): 79 mg/dL (calc) (ref <130)
Total CHOL/HDL Ratio: 2.2 (calc) (ref <5.0)
Triglycerides: 93 mg/dL (ref <150)

## 2023-01-23 LAB — HEMOGLOBIN A1C
Hgb A1c MFr Bld: 5.3 % of total Hgb (ref <5.7)
Mean Plasma Glucose: 105 mg/dL
eAG (mmol/L): 5.8 mmol/L

## 2023-01-23 LAB — INSULIN, RANDOM: Insulin: 10.9 u[IU]/mL

## 2023-01-23 LAB — MICROALBUMIN / CREATININE URINE RATIO
Creatinine, Urine: 106 mg/dL (ref 20–275)
Microalb Creat Ratio: 8 mcg/mg creat (ref <30)
Microalb, Ur: 0.9 mg/dL

## 2023-01-23 LAB — VITAMIN D 25 HYDROXY (VIT D DEFICIENCY, FRACTURES): Vit D, 25-Hydroxy: 122 ng/mL — ABNORMAL HIGH (ref 30–100)

## 2023-01-23 LAB — TSH: TSH: 1.18 m[IU]/L

## 2023-01-25 ENCOUNTER — Encounter: Payer: Self-pay | Admitting: Nurse Practitioner

## 2023-01-25 DIAGNOSIS — M25561 Pain in right knee: Secondary | ICD-10-CM

## 2023-01-25 DIAGNOSIS — W19XXXD Unspecified fall, subsequent encounter: Secondary | ICD-10-CM

## 2023-01-26 ENCOUNTER — Encounter (INDEPENDENT_AMBULATORY_CARE_PROVIDER_SITE_OTHER): Payer: Self-pay | Admitting: Adult Health

## 2023-01-26 ENCOUNTER — Ambulatory Visit (INDEPENDENT_AMBULATORY_CARE_PROVIDER_SITE_OTHER): Payer: BC Managed Care – PPO | Admitting: Adult Health

## 2023-01-26 VITALS — BP 118/77 | HR 108 | Temp 98.1°F | Ht 65.0 in | Wt 194.0 lb

## 2023-01-26 DIAGNOSIS — Z6832 Body mass index (BMI) 32.0-32.9, adult: Secondary | ICD-10-CM | POA: Diagnosis not present

## 2023-01-26 DIAGNOSIS — E669 Obesity, unspecified: Secondary | ICD-10-CM | POA: Diagnosis not present

## 2023-01-26 DIAGNOSIS — R000 Tachycardia, unspecified: Secondary | ICD-10-CM | POA: Diagnosis not present

## 2023-01-26 DIAGNOSIS — E559 Vitamin D deficiency, unspecified: Secondary | ICD-10-CM | POA: Diagnosis not present

## 2023-01-26 NOTE — Progress Notes (Signed)
Office: [phone]  /  Fax: [phone]  WEIGHT SUMMARY AND BIOMETRICS  Medical Weight Loss Height: 5' 5"$  (1.651 m) Weight: 194 lb (88 kg) Temp: 98.1 F (36.7 C) Pulse Rate: (!) 108 BP: 118/77 SpO2: 97 % Fasting: No Labs: No Today's Visit #: 8 Weight at Last VIsit: 194 lb Weight Lost Since Last Visit: 0  Body Fat %: 44.4 % Fat Mass (lbs): 86.2 lbs Muscle Mass (lbs): 102.6 lbs Total Body Water (lbs): 74.2 lbs Visceral Fat Rating : 10    HPI  Chief Complaint: OBESITY  Angelica Weeks is here to discuss her progress with her obesity treatment plan. She is on the the Category 3 Plan and states she is following her eating plan approximately 85 % of the time.  She states she is exercising not currently exercising.   Interval History:  Since last office visit she endorses increased sugar/CHO intake. Specifically in the form of pasta and cake in the evenings.  2024 Health Goals 1) Increase regular exercise 2) Start to loss weight  Pharmacotherapy: Metformin XR 558m BID with meals per PCP  PHYSICAL EXAM:  Blood pressure 118/77, pulse (!) 108, temperature 98.1 F (36.7 C), height 5' 5"$  (1.651 m), weight 194 lb (88 kg), SpO2 97 %. Body mass index is 32.28 kg/m.  General: She is overweight, cooperative, alert, well developed, and in no acute distress. PSYCH: Has normal mood, affect and thought process.   HEENT: EOMI, sclerae are anicteric. Lungs: Normal breathing effort, no conversational dyspnea. Extremities: No edema.  Neurologic: No gross sensory or motor deficits. No tremors or fasciculations noted.    DIAGNOSTIC DATA REVIEWED:  BMET    Component Value Date/Time   NA 138 01/22/2023 1605   NA 134 07/01/2022 1533   K 4.2 01/22/2023 1605   CL 101 01/22/2023 1605   CO2 28 01/22/2023 1605   GLUCOSE 88 01/22/2023 1605   BUN 20 01/22/2023 1605   BUN 9 07/01/2022 1533   CREATININE 0.88 01/22/2023 1605   CALCIUM 9.9 01/22/2023 1605   GFRNONAA 73 01/22/2021 0951    GFRAA 85 01/22/2021 0951   Lab Results  Component Value Date   HGBA1C 5.3 01/22/2023   HGBA1C 5.6 02/13/2015   Lab Results  Component Value Date   INSULIN 9.2 07/01/2022   Lab Results  Component Value Date   TSH 1.18 01/22/2023   CBC    Component Value Date/Time   WBC 8.3 01/22/2023 1605   RBC 4.02 01/22/2023 1605   HGB 13.0 01/22/2023 1605   HGB 12.8 07/01/2022 1533   HCT 37.7 01/22/2023 1605   HCT 36.4 07/01/2022 1533   PLT 303 01/22/2023 1605   PLT 334 07/01/2022 1533   MCV 93.8 01/22/2023 1605   MCV 91 07/01/2022 1533   MCH 32.3 01/22/2023 1605   MCHC 34.5 01/22/2023 1605   RDW 12.6 01/22/2023 1605   RDW 14.1 07/01/2022 1533   Iron Studies    Component Value Date/Time   IRON 92 01/22/2021 0951   TIBC 391 01/22/2021 0951   IRONPCTSAT 24 01/22/2021 0951   Lipid Panel     Component Value Date/Time   CHOL 144 01/22/2023 1605   CHOL 169 07/01/2022 1533   TRIG 93 01/22/2023 1605   HDL 65 01/22/2023 1605   HDL 68 07/01/2022 1533   CHOLHDL 2.2 01/22/2023 1605   VLDL 14 04/13/2017 1117   LDLCALC 61 01/22/2023 1605   Hepatic Function Panel     Component Value Date/Time   PROT  6.7 01/22/2023 1605   PROT 6.6 07/01/2022 1533   ALBUMIN 4.4 07/01/2022 1533   AST 14 01/22/2023 1605   ALT 12 01/22/2023 1605   ALKPHOS 120 07/01/2022 1533   BILITOT 0.2 01/22/2023 1605   BILITOT 0.3 07/01/2022 1533   BILIDIR 0.1 04/13/2017 1117   IBILI 0.1 (L) 04/13/2017 1117      Component Value Date/Time   TSH 1.18 01/22/2023 1605   Nutritional Lab Results  Component Value Date   VD25OH 122 (H) 01/22/2023   VD25OH 81.0 07/01/2022   VD25OH 93 01/22/2022     ASSESSMENT AND PLAN  TREATMENT PLAN FOR OBESITY:  Recommended Dietary Goals  Angelica Weeks is currently in the action stage of change. As such, her goal is to continue weight management plan. She has agreed to the Category 3 Plan.  Behavioral Intervention  We discussed the following Behavioral Modification  Strategies today: increasing lean protein intake, decreasing simple carbohydrates , increasing vegetables, increasing fiber rich foods, increase water intake, decreasing sodium intake, work on meal planning and easy cooking plans, think about ways to increase physical activity, and decreasing caffeine or other stimulant drinks/foods .  Additional resources provided today: Cat 3 meal plan with additional breakfast options.  Recommended Physical Activity Goals  Angelica Weeks has been advised to work up to 150 minutes of moderate intensity aerobic activity a week and strengthening exercises 2-3 times per week for cardiovascular health, weight loss maintenance and preservation of muscle mass.   She has agreed to walk her dog "Marley" once per week and once per weekend.  Pharmacotherapy We discussed various medication options to help Ader with her weight loss efforts and we both agreed to continue Metformin XR 556m BID with meals per PCP. STOP ALL VIT D SUPPLEMENTATION  ASSOCIATED CONDITIONS ADDRESSED TODAY 1) Increased Heart Rate EPIC review demonstrated HR range from upper 60s to upper 110s She denies palpitations or CP. She is on Adderall 259mBID to TID, verified by PDMP. She reports drinking 2 x 8 ox cups coffe in am, no other caffeine/stimulants during day. 2) Vit D Def  01/22/23 Vit D Level- 122- OVERREPLACED. Per pt- she was taking OTC Vit D3 10,000 IU daily. She denies N/V/Muscle Weakness Instructed to STOP all vit D3 supplementation and recheck labs in 8 weeks.  No follow-ups on file.She was informed of the importance of frequent follow up visits to maximize her success with intensive lifestyle modifications for her multiple health conditions.  ATTESTASTION STATEMENTS:  Reviewed by clinician on day of visit: allergies, medications, problem list, medical history, surgical history, family history, social history, and previous encounter notes.   Time spent on visit including pre-visit  chart review and post-visit care and charting was 28 minutes.    [place] GrandchildNP

## 2023-02-02 ENCOUNTER — Ambulatory Visit
Admission: RE | Admit: 2023-02-02 | Discharge: 2023-02-02 | Disposition: A | Discharge status: Home / Self Care | Payer: BC Managed Care – PPO | Source: Ambulatory Visit | Attending: Nurse Practitioner | Admitting: Nurse Practitioner

## 2023-02-02 ENCOUNTER — Other Ambulatory Visit: Payer: Self-pay | Admitting: Nurse Practitioner

## 2023-02-02 DIAGNOSIS — M47816 Spondylosis without myelopathy or radiculopathy, lumbar region: Secondary | ICD-10-CM | POA: Diagnosis not present

## 2023-02-02 DIAGNOSIS — M25561 Pain in right knee: Secondary | ICD-10-CM

## 2023-02-02 DIAGNOSIS — W1840XA Slipping, tripping and stumbling without falling, unspecified, initial encounter: Secondary | ICD-10-CM

## 2023-02-02 DIAGNOSIS — M48061 Spinal stenosis, lumbar region without neurogenic claudication: Secondary | ICD-10-CM | POA: Diagnosis not present

## 2023-02-02 DIAGNOSIS — W19XXXD Unspecified fall, subsequent encounter: Secondary | ICD-10-CM

## 2023-02-02 DIAGNOSIS — M5136 Other intervertebral disc degeneration, lumbar region: Secondary | ICD-10-CM | POA: Diagnosis not present

## 2023-02-02 DIAGNOSIS — G8929 Other chronic pain: Secondary | ICD-10-CM

## 2023-02-02 DIAGNOSIS — R296 Repeated falls: Secondary | ICD-10-CM

## 2023-02-02 MED ORDER — CELECOXIB 100 MG PO CAPS: 100.0000 mg | ORAL_CAPSULE | Freq: Two times a day (BID) | ORAL | 0 refills | Status: DC

## 2023-02-06 ENCOUNTER — Other Ambulatory Visit: Payer: Self-pay | Admitting: Nurse Practitioner

## 2023-02-06 DIAGNOSIS — G8929 Other chronic pain: Secondary | ICD-10-CM

## 2023-02-06 MED ORDER — METHOCARBAMOL 750 MG PO TABS: 750.0000 mg | ORAL_TABLET | Freq: Two times a day (BID) | ORAL | 2 refills | Status: DC | PRN

## 2023-02-12 ENCOUNTER — Telehealth: Payer: Self-pay | Admitting: Adult Health

## 2023-02-12 NOTE — Telephone Encounter (Signed)
PT states she has an appt tomorrow but has not had a sleep study done. States no one has called her. Please call PT to advise.   I will keep appt open just in case so please cancel if we need to sched future sleep study.   Her # is [phone]

## 2023-02-13 NOTE — Telephone Encounter (Signed)
Appointment was canceled yesterday. Study not done for titration as of yet. Nothing further needed

## 2023-02-17 ENCOUNTER — Ambulatory Visit: Payer: BC Managed Care – PPO | Admitting: Adult Health

## 2023-02-19 NOTE — Progress Notes (Signed)
Chief Complaint:   OBESITY Angelica Weeks is here to discuss her progress with her obesity treatment plan along with follow-up of her obesity related diagnoses. Angelica Weeks is on the Category 3 Plan and states she is following her eating plan approximately 85% of the time. [name] states she is not exercising.  Today's visit was #: 7 Starting weight: 222 lbs Starting date: 05/28/2022 Today's weight: 194 lbs Today's date: 12/24/2022 Total lbs lost to date: 28 lbs Total lbs lost since last in-office visit: 7 lbs  Interim History: Patient has good December, stayed local.  Patient forgot her paperwork at goal the clinic at last appointment.  Eating all proteins at meals but following category 2.  She reverted back to category 3.  She mentioned she could start walking her dog.  Subjective:   1. OSA (obstructive sleep apnea) Patient has severe sleep apnea on sleep study 08/2022.  Patient could not tolerate CPAP.  Assessment/Plan:   1. OSA (obstructive sleep apnea) Refer to the follow-up pulmonology for evaluation of inspire device.  Discussed at length pathophysiology of HFpEF, leading to HFrEF.   Referral - Ambulatory referral to Pulmonology  2. Obesity with current BMI of 32.4 Angelica Weeks is currently in the action stage of change. As such, her goal is to continue with weight loss efforts. She has agreed to the Category 3 Plan will 6 to 8 ounces of protein at supper.  Exercise goals: All adults should avoid inactivity. Some physical activity is better than none, and adults who participate in any amount of physical activity gain some health benefits.  10 to 15 minutes 3 times per week  Behavioral modification strategies: increasing lean protein intake, meal planning and cooking strategies, keeping healthy foods in the home, and planning for success.  Angelica Weeks has agreed to follow-up with our clinic in 3-4 weeks. She was informed of the importance of frequent follow-up visits to maximize her success  with intensive lifestyle modifications for her multiple health conditions.   Objective:   Blood pressure 108/70, pulse (!) 119, temperature 98.5 F (36.9 C), height 5\' 5"  (1.651 m), weight 194 lb (88 kg), SpO2 99 %. Body mass index is 32.28 kg/m.  General: Cooperative, alert, well developed, in no acute distress. HEENT: Conjunctivae and lids unremarkable. Cardiovascular: Regular rhythm.  Lungs: Normal work of breathing. Neurologic: No focal deficits.   Lab Results  Component Value Date   CREATININE 0.88 01/22/2023   BUN 20 01/22/2023   NA 138 01/22/2023   K 4.2 01/22/2023   CL 101 01/22/2023   CO2 28 01/22/2023   Lab Results  Component Value Date   ALT 12 01/22/2023   AST 14 01/22/2023   ALKPHOS 120 07/01/2022   BILITOT 0.2 01/22/2023   Lab Results  Component Value Date   HGBA1C 5.3 01/22/2023   HGBA1C 5.0 07/01/2022   HGBA1C 5.0 01/22/2022   HGBA1C 5.0 01/22/2021   HGBA1C 4.6 10/10/2019   Lab Results  Component Value Date   INSULIN 9.2 07/01/2022   Lab Results  Component Value Date   TSH 1.18 01/22/2023   Lab Results  Component Value Date   CHOL 144 01/22/2023   HDL 65 01/22/2023   LDLCALC 61 01/22/2023   TRIG 93 01/22/2023   CHOLHDL 2.2 01/22/2023   Lab Results  Component Value Date   VD25OH 122 (H) 01/22/2023   VD25OH 81.0 07/01/2022   VD25OH 93 01/22/2022   Lab Results  Component Value Date   WBC 8.3 01/22/2023  HGB 13.0 01/22/2023   HCT 37.7 01/22/2023   MCV 93.8 01/22/2023   PLT 303 01/22/2023   Lab Results  Component Value Date   IRON 92 01/22/2021   TIBC 391 01/22/2021   Attestation Statements:   Reviewed by clinician on day of visit: allergies, medications, problem list, medical history, surgical history, family history, social history, and previous encounter notes.  I, Davy Pique, RMA, am acting as transcriptionist for Coralie Common, MD.  I have reviewed the above documentation for accuracy and completeness, and I agree  with the above. - Coralie Common, MD

## 2023-02-23 ENCOUNTER — Ambulatory Visit (INDEPENDENT_AMBULATORY_CARE_PROVIDER_SITE_OTHER): Payer: BC Managed Care – PPO | Admitting: Adult Health

## 2023-03-02 NOTE — Telephone Encounter (Signed)
Bipap titration study has been scheduled for 4/3.  I have left pt a vm to call me for appt info.  Will route to triage so pt can be made aware thru MyChart and can call me at [phone].

## 2023-03-03 DIAGNOSIS — F900 Attention-deficit hyperactivity disorder, predominantly inattentive type: Secondary | ICD-10-CM | POA: Diagnosis not present

## 2023-03-03 DIAGNOSIS — F411 Generalized anxiety disorder: Secondary | ICD-10-CM | POA: Diagnosis not present

## 2023-03-03 DIAGNOSIS — F3132 Bipolar disorder, current episode depressed, moderate: Secondary | ICD-10-CM | POA: Diagnosis not present

## 2023-03-08 ENCOUNTER — Other Ambulatory Visit: Payer: Self-pay | Admitting: Nurse Practitioner

## 2023-03-08 DIAGNOSIS — W19XXXD Unspecified fall, subsequent encounter: Secondary | ICD-10-CM

## 2023-03-08 DIAGNOSIS — M545 Low back pain, unspecified: Secondary | ICD-10-CM

## 2023-03-08 DIAGNOSIS — M25561 Pain in right knee: Secondary | ICD-10-CM

## 2023-03-16 NOTE — Progress Notes (Unsigned)
Assessment and Plan: Latrease was seen today for hearing problem.  Diagnoses and all orders for this visit:  Obesity Long discussion about weight loss, diet, and exercise Recommended diet heavy in fruits and veggies and low in animal meats, cheeses, and dairy products, appropriate calorie intake Patient will work on decreasing saturated fats, simple carbs. Current activity is limited due to right knee pain Follow up at next visit  Acute pain of right knee Continue ice/heat alternation, wear pull on brace as needed Continue Methocarbamol as needed Call to set up PT Meloxicam once a day- do not take any other NSAIDS , can supplement wit Tylenol -     Meloxicam 15 MG TBDP; Take 15 mg by mouth daily.  Hearing loss due to cerumen impaction, right Cerumen impaction of right ear removed with ear lavage Monitor and notify office if symptoms return -     Ear Lavage       Further disposition pending results of labs. Discussed med's effects and SE's.   Over 30 minutes of exam, counseling, chart review, and critical decision making was performed.   Future Appointments  Date Time Provider [place]  03/18/2023  8:00 PM Rigoberto Noel, MD [facility]  03/23/2023  3:00 PM [facility] MM 3 GI[facility]BCGMM [facility] CE  03/24/2023  3:00 PM Danford, Valetta Fuller D, NP MWM-[facility]WM None  05/19/2023  3:30 PM Cranford, Kenney Houseman, NP [facility] None  01/25/2024  3:00 PM Cranford, Kenney Houseman, NP [facility] None    ------------------------------------------------------------------------------------------------------------------   HPI Pulse (!) 103   Temp 97.9 F (36.6 C)   Ht 5\' 5"  (1.651 m)   Wt 192 lb 12.8 oz (87.5 kg)   LMP  (LMP Unknown) Comment: "I've not had a period in 3-4 years"  SpO2 96%   BMI 32.08 kg/m   52 y.o.female presents for difficulty hearing out of both ears which began yesterday.  Denies congestion, cough, sinus pressure, body aches, fevers. She is not currently taking any allergy medications   BMI  is Body mass index is 32.08 kg/m., she has been working on diet and exercise. Wt Readings from Last 3 Encounters:  03/17/23 192 lb 12.8 oz (87.5 kg)  01/26/23 194 lb (88 kg)  01/22/23 197 lb (89.4 kg)   She was evaluated for right knee pain after a fall 12/22/22- she was started on methocarbamol and referred for PT.  States she continues to have pain on both sides of right knee.  Can not afford to get a MRI. Wondering if anything else can be used for pain   Past Medical History:  Diagnosis Date   ADD (attention deficit disorder)    Anxiety    Bipolar 1 disorder, depressed    Cannabis abuse with psychotic disorder with delusions 05/22/2018   Cellulitis of left eyelid 01/2014   Depression    Dysmenorrhea    Paranoid delusion 05/22/2018   Schizophrenia    Vitamin D deficiency      Allergies  Allergen Reactions   Aspirin Other (See Comments)    Burns stomach    Current Outpatient Medications on File Prior to Visit  Medication Sig   ALPRAZolam (XANAX) 1 MG tablet Take 1 mg by mouth at bedtime as needed for anxiety.   amphetamine-dextroamphetamine (ADDERALL) 20 MG tablet Take 1 tablet (20 mg total) by mouth 3 (three) times daily.   atomoxetine (STRATTERA) 25 MG capsule Take 2 capsules (50 mg total) by mouth daily.   Cyanocobalamin 5000 MCG SUBL Place 1 tablet (5,000 mcg total)  under the tongue once a week.   glycopyrrolate (ROBINUL) 1 MG tablet TAKE 1 TABLET BY MOUTH 3 TIMES DAILY.   lamoTRIgine (LAMICTAL) 200 MG tablet Take 2 tablets (400 mg total) by mouth at bedtime.   Magnesium 500 MG CAPS Take by mouth.   metFORMIN (GLUCOPHAGE-XR) 500 MG 24 hr tablet Take 1 tab daily with largest meal of the day for 4 weeks; if tolerating well increase to twice daily with meals for insulin resistance.   methocarbamol (ROBAXIN) 750 MG tablet Take 1 tablet (750 mg total) by mouth 2 (two) times daily as needed for muscle spasms.   ondansetron (ZOFRAN) 4 MG tablet TAKE 1 TABLET BY MOUTH 3 TIMES A  DAY IF NEEDED FOR NAUSEA   traZODone (DESYREL) 100 MG tablet Take 100 mg by mouth. Takes 200 mg at bedtime.   VITAMIN D PO Take 250 mcg by mouth daily.   VRAYLAR 6 MG CAPS TAKE 1 CAPSULE BY MOUTH DAILY   No current facility-administered medications on file prior to visit.    ROS: all negative except above.   Physical Exam:  Pulse (!) 103   Temp 97.9 F (36.6 C)   Ht 5\' 5"  (1.651 m)   Wt 192 lb 12.8 oz (87.5 kg)   LMP  (LMP Unknown) Comment: "I've not had a period in 3-4 years"  SpO2 96%   BMI 32.08 kg/m   General Appearance: Well nourished, in no apparent distress. Eyes: PERRLA, EOMs, conjunctiva no swelling or erythema Sinuses: No Frontal/maxillary tenderness ENT/Mouth: Ext aud canals clear, L TM no bulging or erythema. R TM cerumen impaction- ear lavage performed, TM visualized with no bulging or erythema. No erythema, swelling, or exudate on post pharynx.  Tonsils not swollen or erythematous. Hearing normal.  Neck: Supple, thyroid normal.  Respiratory: Respiratory effort normal, BS equal bilaterally without rales, rhonchi, wheezing or stridor.  Cardio: RRR with no MRGs. Brisk peripheral pulses without edema.  Abdomen: Soft, + BS.  Non tender, no guarding, rebound, hernias, masses. Lymphatics: Non tender without lymphadenopathy.  Musculoskeletal: Full ROM, 5/5 strength,. Mild tenderness of medial and lateral right knee with palpation. Antalgic gait Skin: Warm, dry without rashes, lesions, ecchymosis.  Neuro: Cranial nerves intact. Normal muscle tone, no cerebellar symptoms. Sensation intact.  Psych: Awake and oriented X 3, normal affect, Insight and Judgment appropriate.     Alycia Rossetti, NP 9:47 AM [facility]

## 2023-03-17 ENCOUNTER — Other Ambulatory Visit: Payer: Self-pay | Admitting: Nurse Practitioner

## 2023-03-17 ENCOUNTER — Encounter: Payer: Self-pay | Admitting: Nurse Practitioner

## 2023-03-17 ENCOUNTER — Ambulatory Visit (INDEPENDENT_AMBULATORY_CARE_PROVIDER_SITE_OTHER): Payer: BC Managed Care – PPO | Admitting: Nurse Practitioner

## 2023-03-17 DIAGNOSIS — M25561 Pain in right knee: Secondary | ICD-10-CM

## 2023-03-17 DIAGNOSIS — H6121 Impacted cerumen, right ear: Secondary | ICD-10-CM

## 2023-03-17 MED ORDER — MELOXICAM 15 MG PO TBDP: 15.0000 mg | ORAL_TABLET | Freq: Every day | ORAL | 0 refills | Status: DC

## 2023-03-17 NOTE — Patient Instructions (Signed)
Acute Knee Pain, Adult Acute knee pain is sudden and may be caused by damage, swelling, or irritation of the muscles and tissues that support the knee. Pain may result from: A fall. An injury to the knee from twisting motions. A hit to the knee. Infection. Acute knee pain may go away on its own with time and rest. If it does not, your health care provider may order tests to find the cause of the pain. These may include: Imaging tests, such as an X-ray, MRI, CT scan, or ultrasound. Joint aspiration. In this test, fluid is removed from the knee and evaluated. Arthroscopy. In this test, a lighted tube is inserted into the knee and an image is projected onto a TV screen. Biopsy. In this test, a sample of tissue is removed from the body and studied under a microscope. Follow these instructions at home: If you have a knee sleeve or brace:  Wear the knee sleeve or brace as told by your health care provider. Remove it only as told by your health care provider. Loosen it if your toes tingle, become numb, or turn cold and blue. Keep it clean. If the knee sleeve or brace is not waterproof: Do not let it get wet. Cover it with a watertight covering when you take a bath or shower. Activity Rest your knee. Do not do things that cause pain or make pain worse. Avoid high-impact activities or exercises, such as running, jumping rope, or doing jumping jacks. Work with a physical therapist to make a safe exercise program, as recommended by your health care provider. Do exercises as told by your physical therapist. Managing pain, stiffness, and swelling  If directed, put ice on the affected knee. To do this: If you have a removable knee sleeve or brace, remove it as told by your health care provider. Put ice in a plastic bag. Place a towel between your skin and the bag. Leave the ice on for 20 minutes, 2-3 times a day. Remove the ice if your skin turns bright red. This is very important. If you cannot  feel pain, heat, or cold, you have a greater risk of damage to the area. If directed, use an elastic bandage to put pressure (compression) on your injured knee. This may control swelling, give support, and help with discomfort. Raise (elevate) your knee above the level of your heart while you are sitting or lying down. Sleep with a pillow under your knee. General instructions Take over-the-counter and prescription medicines only as told by your health care provider. Do not use any products that contain nicotine or tobacco, such as cigarettes, e-cigarettes, and chewing tobacco. If you need help quitting, ask your health care provider. If you are overweight, work with your health care provider and a dietitian to set a weight-loss goal that is healthy and reasonable for you. Extra weight can put pressure on your knee. Pay attention to any changes in your symptoms. Keep all follow-up visits. This is important. Contact a health care provider if: Your knee pain continues, changes, or gets worse. You have a fever along with knee pain. Your knee feels warm to the touch or is red. Your knee buckles or locks up. Get help right away if: Your knee swells, and the swelling becomes worse. You cannot move your knee. You have severe pain in your knee that cannot be managed with pain medicine. Summary Acute knee pain can be caused by a fall, an injury, an infection, or damage, swelling, or irritation   of the tissues that support your knee. Your health care provider may perform tests to find out the cause of the pain. Pay attention to any changes in your symptoms. Relieve your pain with rest, medicines, light activity, and the use of ice. Get help right away if your knee swells, you cannot move your knee, or you have severe pain that cannot be managed with medicine. This information is not intended to replace advice given to you by your health care provider. Make sure you discuss any questions you have with  your health care provider. Document Revised: 05/16/2020 Document Reviewed: 05/16/2020 Elsevier Patient Education  2023 Elsevier Inc.  

## 2023-03-18 ENCOUNTER — Ambulatory Visit (HOSPITAL_BASED_OUTPATIENT_CLINIC_OR_DEPARTMENT_OTHER): Payer: BC Managed Care – PPO | Attending: Adult Health | Admitting: Pulmonary Disease

## 2023-03-18 VITALS — Ht 65.0 in | Wt 191.0 lb

## 2023-03-18 DIAGNOSIS — G4731 Primary central sleep apnea: Secondary | ICD-10-CM

## 2023-03-18 DIAGNOSIS — G4733 Obstructive sleep apnea (adult) (pediatric): Secondary | ICD-10-CM | POA: Insufficient documentation

## 2023-03-23 ENCOUNTER — Ambulatory Visit
Admission: RE | Admit: 2023-03-23 | Discharge: 2023-03-23 | Disposition: A | Discharge status: Home / Self Care | Payer: BC Managed Care – PPO | Source: Ambulatory Visit | Attending: Nurse Practitioner | Admitting: Nurse Practitioner

## 2023-03-23 ENCOUNTER — Other Ambulatory Visit: Payer: Self-pay | Admitting: Nurse Practitioner

## 2023-03-23 DIAGNOSIS — K219 Gastro-esophageal reflux disease without esophagitis: Secondary | ICD-10-CM

## 2023-03-23 DIAGNOSIS — Z1231 Encounter for screening mammogram for malignant neoplasm of breast: Secondary | ICD-10-CM

## 2023-03-23 DIAGNOSIS — Z79899 Other long term (current) drug therapy: Secondary | ICD-10-CM

## 2023-03-23 DIAGNOSIS — Z6838 Body mass index (BMI) 38.0-38.9, adult: Secondary | ICD-10-CM

## 2023-03-23 DIAGNOSIS — Z13220 Encounter for screening for lipoid disorders: Secondary | ICD-10-CM

## 2023-03-23 DIAGNOSIS — E538 Deficiency of other specified B group vitamins: Secondary | ICD-10-CM

## 2023-03-23 DIAGNOSIS — F319 Bipolar disorder, unspecified: Secondary | ICD-10-CM

## 2023-03-23 DIAGNOSIS — Z1389 Encounter for screening for other disorder: Secondary | ICD-10-CM

## 2023-03-23 DIAGNOSIS — F988 Other specified behavioral and emotional disorders with onset usually occurring in childhood and adolescence: Secondary | ICD-10-CM

## 2023-03-23 DIAGNOSIS — Z131 Encounter for screening for diabetes mellitus: Secondary | ICD-10-CM

## 2023-03-23 DIAGNOSIS — F1221 Cannabis dependence, in remission: Secondary | ICD-10-CM

## 2023-03-23 DIAGNOSIS — R296 Repeated falls: Secondary | ICD-10-CM

## 2023-03-23 DIAGNOSIS — M545 Low back pain, unspecified: Secondary | ICD-10-CM

## 2023-03-23 DIAGNOSIS — Z1329 Encounter for screening for other suspected endocrine disorder: Secondary | ICD-10-CM

## 2023-03-23 DIAGNOSIS — W1840XA Slipping, tripping and stumbling without falling, unspecified, initial encounter: Secondary | ICD-10-CM

## 2023-03-23 DIAGNOSIS — Z0001 Encounter for general adult medical examination with abnormal findings: Secondary | ICD-10-CM

## 2023-03-23 DIAGNOSIS — G4733 Obstructive sleep apnea (adult) (pediatric): Secondary | ICD-10-CM

## 2023-03-23 DIAGNOSIS — E559 Vitamin D deficiency, unspecified: Secondary | ICD-10-CM

## 2023-03-23 DIAGNOSIS — R7989 Other specified abnormal findings of blood chemistry: Secondary | ICD-10-CM

## 2023-03-23 DIAGNOSIS — Z1211 Encounter for screening for malignant neoplasm of colon: Secondary | ICD-10-CM

## 2023-03-23 DIAGNOSIS — Z136 Encounter for screening for cardiovascular disorders: Secondary | ICD-10-CM

## 2023-03-23 DIAGNOSIS — F17200 Nicotine dependence, unspecified, uncomplicated: Secondary | ICD-10-CM

## 2023-03-23 DIAGNOSIS — R61 Generalized hyperhidrosis: Secondary | ICD-10-CM

## 2023-03-24 ENCOUNTER — Ambulatory Visit (INDEPENDENT_AMBULATORY_CARE_PROVIDER_SITE_OTHER): Payer: BC Managed Care – PPO | Admitting: Adult Health

## 2023-03-25 DIAGNOSIS — L814 Other melanin hyperpigmentation: Secondary | ICD-10-CM | POA: Diagnosis not present

## 2023-03-30 DIAGNOSIS — G4733 Obstructive sleep apnea (adult) (pediatric): Secondary | ICD-10-CM | POA: Diagnosis not present

## 2023-03-30 DIAGNOSIS — G4731 Primary central sleep apnea: Secondary | ICD-10-CM

## 2023-03-30 NOTE — Procedures (Signed)
Patient Name: Angelica Weeks, Badertscher Date: 03/18/2023 Gender: Female D.O.B: Jun 03, 1971 Age (years): 65 Referring Provider: Babette Relic Parrett Height (inches): 65 Interpreting Physician: Cyril Mourning MD, ABSM Weight (lbs): 191 RPSGT: Ulyess Mort BMI: 32 MRN: [id number] Neck Size: 14.50 <br> <br> CLINICAL INFORMATION The patient is referred for a BiPAP titration to treat sleep apnea.    Date of NPSG :  August 05, 2022 AHI 64.3/hour (126.4/hour in supine position), SpO2 low at 62%, central sleep apnea was noted, cheyne stokes breathing   SLEEP STUDY TECHNIQUE As per the AASM Manual for the Scoring of Sleep and Associated Events v2.3 (April 2016) with a hypopnea requiring 4% desaturations.  The channels recorded and monitored were frontal, central and occipital EEG, electrooculogram (EOG), submentalis EMG (chin), nasal and oral airflow, thoracic and abdominal wall motion, anterior tibialis EMG, snore microphone, electrocardiogram, and pulse oximetry. Bilevel positive airway pressure (BPAP) was initiated at the beginning of the study and titrated to treat sleep-disordered breathing.  MEDICATIONS Medications self-administered by patient taken the night of the study : LAMICTAL, MAGNESIUM, METFORMIN, TRAZODONE, XANAX  RESPIRATORY PARAMETERS Optimal IPAP Pressure (cm): 22 AHI at Optimal Pressure (/hr) 0 Optimal EPAP Pressure (cm): 18   Overall Minimal O2 (%): 91.0 Minimal O2 at Optimal Pressure (%): 91.0 SLEEP ARCHITECTURE Start Time: 10:01:20 PM Stop Time: 4:28:05 AM Total Time (min): 386.7 Total Sleep Time (min): 347.5 Sleep Latency (min): 4.5 Sleep Efficiency (%): 89.9% REM Latency (min): 301.0 WASO (min): 34.8 Stage N1 (%): 11.4% Stage N2 (%): 81.4% Stage N3 (%): 0.0% Stage R (%): 7.2 Supine (%): 52.81 Arousal Index (/hr): 35.6     CARDIAC DATA The 2 lead EKG demonstrated sinus rhythm. The mean heart rate was 71.4 beats per minute. Other EKG findings include: None.   LEG MOVEMENT  DATA The total Periodic Limb Movements of Sleep (PLMS) were 0. The PLMS index was 0.0. A PLMS index of <15 is considered normal in adults.  IMPRESSIONS - An optimal PAP pressure was selected for this patient ( 22 / 18cm of water) - Mild Central Sleep Apnea was noted during this titration (CAI = 7.6/h). - Significant oxygen desaturations were not observed during this titration (min O2 = 91.0%). - The patient snored with moderate snoring volume. - No cardiac abnormalities were observed during this study. - Clinically significant periodic limb movements were not noted during this study. Arousals associated with PLMs were rare.   DIAGNOSIS - Central  Sleep Apnea - Obstructive sleep apnea (G47.33)   RECOMMENDATIONS - Trial of BiPAP ST therapy on 22/18 cm H2O with a Extra Small size [employer]kel Full Face Evora Full mask and heated humidification. Back up RR of 8 - Avoid alcohol, sedatives and other CNS depressants that may worsen sleep apnea and disrupt normal sleep architecture. - Sleep hygiene should be reviewed to assess factors that may improve sleep quality. - Weight management and regular exercise should be initiated or continued. - Return to Sleep Center for re-evaluation after 4 weeks of therapy   Cyril Mourning MD Board Certified in Sleep medicine

## 2023-03-31 ENCOUNTER — Telehealth: Payer: Self-pay | Admitting: Adult Health

## 2023-03-31 NOTE — Telephone Encounter (Signed)
Titration study  results. Please set up office visit or virtual visit to review results and go over treatment plan . :  Trial of BiPAP ST therapy on 22/18 cm H2O with a Extra Small size [employer]kel Full Face Evora Full mask and heated humidification. Back up RR of 8

## 2023-04-01 NOTE — Progress Notes (Signed)
ATC x1.  LVM to return call and schedule f/u OV to review test results.

## 2023-04-06 NOTE — Telephone Encounter (Signed)
ATC patient x1.  Left detailed message per DPR.

## 2023-04-08 ENCOUNTER — Telehealth: Payer: Self-pay | Admitting: Nurse Practitioner

## 2023-04-08 ENCOUNTER — Other Ambulatory Visit: Payer: Self-pay | Admitting: Nurse Practitioner

## 2023-04-08 DIAGNOSIS — E88819 Insulin resistance, unspecified: Secondary | ICD-10-CM

## 2023-04-08 MED ORDER — METFORMIN HCL ER 500 MG PO TB24
ORAL_TABLET | ORAL | 3 refills | Status: DC
Start: 2023-04-08 — End: 2023-11-09

## 2023-04-08 NOTE — Telephone Encounter (Signed)
Pt is needing metformin sent to [facility] on [address]

## 2023-04-09 NOTE — Progress Notes (Signed)
ATC x2.  Left VM to return call to schedule f/u.

## 2023-04-10 ENCOUNTER — Telehealth: Payer: Self-pay | Admitting: Adult Health

## 2023-04-10 NOTE — Telephone Encounter (Signed)
I believe this would fall to the responsibility of the clinic where the procedure will be performed.  The PCCs do not provide any monetary estimates for anything.  We only obtain prior authorizations.  We NEVER provide any estimates at all.

## 2023-04-10 NOTE — Telephone Encounter (Signed)
ATC the patient. LVM for the patient to return my call. I have placed a letter in the mail for the patient to contact our office to schedule an appointment.  Nothing further needed.

## 2023-04-10 NOTE — Telephone Encounter (Signed)
Patient called highly upset that she was sent from bipap titration and was told by our office, she states, that it would be covered. I contacted billing and was told she is still in her deductible-that is why they did not pay the full amount. Informed patient of this and she again states she was told by our office it would be covered. I spoke with the sleep center and confirmed that they do not do estimates or inform patient of out of pocket cost. I am unsure what to do or tell the patient. Looks like an authorization was obtained. How can we prevent this from happening?

## 2023-04-12 ENCOUNTER — Other Ambulatory Visit: Payer: Self-pay | Admitting: Nurse Practitioner

## 2023-04-12 DIAGNOSIS — M25561 Pain in right knee: Secondary | ICD-10-CM

## 2023-04-13 ENCOUNTER — Ambulatory Visit (INDEPENDENT_AMBULATORY_CARE_PROVIDER_SITE_OTHER): Payer: BC Managed Care – PPO | Admitting: Adult Health

## 2023-04-13 ENCOUNTER — Encounter (INDEPENDENT_AMBULATORY_CARE_PROVIDER_SITE_OTHER): Payer: Self-pay | Admitting: Adult Health

## 2023-04-13 VITALS — BP 107/72 | HR 102 | Temp 98.2°F | Ht 65.0 in | Wt 189.0 lb

## 2023-04-13 DIAGNOSIS — R000 Tachycardia, unspecified: Secondary | ICD-10-CM | POA: Diagnosis not present

## 2023-04-13 DIAGNOSIS — E559 Vitamin D deficiency, unspecified: Secondary | ICD-10-CM

## 2023-04-13 DIAGNOSIS — G4733 Obstructive sleep apnea (adult) (pediatric): Secondary | ICD-10-CM | POA: Diagnosis not present

## 2023-04-13 DIAGNOSIS — E669 Obesity, unspecified: Secondary | ICD-10-CM | POA: Diagnosis not present

## 2023-04-13 DIAGNOSIS — Z6831 Body mass index (BMI) 31.0-31.9, adult: Secondary | ICD-10-CM

## 2023-04-13 NOTE — Telephone Encounter (Signed)
Left patient a detailed message per DPR that an authorization was obtained for the BIPAP Titration, however the amount not covered by insurance went toward deductible and sent detailed mychart message. Discussed with [facility] about verbiage.  Nothing further needed.

## 2023-04-13 NOTE — Telephone Encounter (Signed)
Can we possibly add in our verbiage to the patient that it is the responsibility of the facility where they go to provide any kind of estimates? Just want to try to prevent this.

## 2023-04-13 NOTE — Telephone Encounter (Signed)
Patient returned call and has a f/u on 5/15 at 11 am.  Nothing further needed.

## 2023-04-13 NOTE — Progress Notes (Signed)
WEIGHT SUMMARY AND BIOMETRICS  Vitals Temp: 98.2 F (36.8 C) BP: 107/72 Pulse Rate: (!) 102 SpO2: 99 %   Anthropometric Measurements Height: 5\' 5"  (1.651 m) Weight: 189 lb (85.7 kg) BMI (Calculated): 31.45 Weight at Last Visit: 194LB Weight Lost Since Last Visit: 5lb Weight Gained Since Last Visit: 0 Starting Weight: 222LB Total Weight Loss (lbs): 33 lb (15 kg)   Body Composition  Body Fat %: 43.4 % Fat Mass (lbs): 82.2 lbs Muscle Mass (lbs): 102 lbs Total Body Water (lbs): 72.4 lbs Visceral Fat Rating : 10   Other Clinical Data Fasting: no Labs: NO Today's Visit #: 9 Starting Date: 05/28/22    Chief Complaint:   OBESITY Angelica Weeks is here to discuss her progress with her obesity treatment plan. She is on the the Category 3 Plan and states she is following her eating plan approximately 50 % of the time.  She states she is not currently exercising.   Interim History:  Angelica Weeks provided the following food recall that is typical of a day: Breakfast:45 cal toast with eggs Lunch: Meat protein sandwich Dinner: Baked meat (usually chicken) with vegetables She will consume either pizza or pasta 1-2 times per week with her family She lives with her boyfriend, and her son (19 yrs) and daughter (17 yrs)  Mid may trip to [place] with her family- dicussed travel eating strategies.  Reviewed Bioimpedance Results with pt: Muscle Mass: -0.6 lbs Adipose Mass: - 4 lbs  Subjective:   1. OSA (obstructive sleep apnea) 03/18/2023 Date of NPSG :  August 05, 2022 AHI 64.3/hour (126.4/hour in supine position), SpO2 low at 62%, central sleep apnea was noted, cheyne stokes breathing    SLEEP STUDY TECHNIQUE As per the AASM Manual for the Scoring of Sleep and Associated Events v2.3 (April 2016) with a hypopnea requiring 4% desaturations.   The channels recorded and monitored were frontal, central and occipital EEG, electrooculogram (EOG), submentalis EMG (chin), nasal  and oral airflow, thoracic and abdominal wall motion, anterior tibialis EMG, snore microphone, electrocardiogram, and pulse oximetry. Bilevel positive airway pressure (BPAP) was initiated at the beginning of the study and titrated to treat sleep-disordered breathing.   MEDICATIONS Medications self-administered by patient taken the night of the study : LAMICTAL, MAGNESIUM, METFORMIN, TRAZODONE, XANAX   RESPIRATORY PARAMETERS Optimal IPAP Pressure (cm): 22        AHI at Optimal Pressure (/hr) 0 Optimal EPAP Pressure (cm):            18           Overall Minimal O2 (%):         91.0     Minimal O2 at Optimal Pressure (%): 91.0 SLEEP ARCHITECTURE Start Time:      10:01:20 PM    Stop Time:       4:28:05 AM      Total Time (min):         386.7   Total Sleep Time (min):      347.5 Sleep Latency (min):   4.5       Sleep Efficiency (%):   89.9%  REM Latency (min):    301.0   WASO (min):  34.8 Stage N1 (%): 11.4%  Stage N2 (%): 81.4%  Stage N3 (%): 0.0%    Stage R (%):   7.2 Supine (%):     52.81   Arousal Index (/hr):     35.6  CARDIAC DATA The 2 lead EKG demonstrated sinus rhythm. The mean heart rate was 71.4 beats per minute. Other EKG findings include: None.     LEG MOVEMENT DATA The total Periodic Limb Movements of Sleep (PLMS) were 0. The PLMS index was 0.0. A PLMS index of <15 is considered normal in adults.   IMPRESSIONS - An optimal PAP pressure was selected for this patient ( 22 / 18cm of water) - Mild Central Sleep Apnea was noted during this titration (CAI = 7.6/h). - Significant oxygen desaturations were not observed during this titration (min O2 = 91.0%). - The patient snored with moderate snoring volume. - No cardiac abnormalities were observed during this study. - Clinically significant periodic limb movements were not noted during this study. Arousals associated with PLMs were rare. DIAGNOSIS - Central  Sleep Apnea - Obstructive sleep apnea (G47.33)  2. Vitamin D  deficiency  Latest Reference Range & Units 01/22/23 16:05  Vitamin D, 25-Hydroxy 30 - 100 ng/mL 122 (H)  (H): Data is abnormally high 01/26/2023- She stopped OTC Vit D3 10,000 IU QD She then re-started OTC Vit D 3 5,000 IU QD on/about February 13, 2023 She denies N/V/Muscle Weakness  3. Increased heart rate HR slightly elevated. She reports 1-2 cups coffee each morning and Adderall 20mg  TID. She reports taking Adderall 20mg - 0800, 1200, 1530 She denies acute cardiac sx's at present  Assessment/Plan:   1. OSA (obstructive sleep apnea) F/u with Pulm- contact information provided  2. Vitamin D deficiency Check Labs - VITAMIN D 25 Hydroxy (Vit-D Deficiency, Fractures)  3. Increased heart rate Limit caffine Increase daily walking  4. Obesity with current BMI of 31.45  Angelica Weeks is currently in the action stage of change. As such, her goal is to continue with weight loss efforts. She has agreed to the Category 3 Plan.   Exercise goals: All adults should avoid inactivity. Some physical activity is better than none, and adults who participate in any amount of physical activity gain some health benefits.  Behavioral modification strategies: increasing lean protein intake, decreasing simple carbohydrates, increasing vegetables, increasing water intake, decreasing sodium intake, no skipping meals, meal planning and cooking strategies, keeping healthy foods in the home, dealing with family or coworker sabotage, travel eating strategies, and planning for success.  Angelica Weeks has agreed to follow-up with our clinic in 4 weeks. She was informed of the importance of frequent follow-up visits to maximize her success with intensive lifestyle modifications for her multiple health conditions.   Angelica Weeks was informed we would discuss her lab results at her next visit unless there is a critical issue that needs to be addressed sooner. Angelica Weeks agreed to keep her next visit at the agreed upon time to discuss these  results.  Objective:   Blood pressure 107/72, pulse (!) 102, temperature 98.2 F (36.8 C), height 5\' 5"  (1.651 m), weight 189 lb (85.7 kg), SpO2 99 %. Body mass index is 31.45 kg/m.  General: Cooperative, alert, well developed, in no acute distress. HEENT: Conjunctivae and lids unremarkable. Cardiovascular: Regular rhythm.  Lungs: Normal work of breathing. Neurologic: No focal deficits.   Lab Results  Component Value Date   CREATININE 0.88 01/22/2023   BUN 20 01/22/2023   NA 138 01/22/2023   K 4.2 01/22/2023   CL 101 01/22/2023   CO2 28 01/22/2023   Lab Results  Component Value Date   ALT 12 01/22/2023   AST 14 01/22/2023   ALKPHOS 120 07/01/2022   BILITOT 0.2 01/22/2023  Lab Results  Component Value Date   HGBA1C 5.3 01/22/2023   HGBA1C 5.0 07/01/2022   HGBA1C 5.0 01/22/2022   HGBA1C 5.0 01/22/2021   HGBA1C 4.6 10/10/2019   Lab Results  Component Value Date   INSULIN 9.2 07/01/2022   Lab Results  Component Value Date   TSH 1.18 01/22/2023   Lab Results  Component Value Date   CHOL 144 01/22/2023   HDL 65 01/22/2023   LDLCALC 61 01/22/2023   TRIG 93 01/22/2023   CHOLHDL 2.2 01/22/2023   Lab Results  Component Value Date   VD25OH 122 (H) 01/22/2023   VD25OH 81.0 07/01/2022   VD25OH 93 01/22/2022   Lab Results  Component Value Date   WBC 8.3 01/22/2023   HGB 13.0 01/22/2023   HCT 37.7 01/22/2023   MCV 93.8 01/22/2023   PLT 303 01/22/2023   Lab Results  Component Value Date   IRON 92 01/22/2021   TIBC 391 01/22/2021    Attestation Statements:   Reviewed by clinician on day of visit: allergies, medications, problem list, medical history, surgical history, family history, social history, and previous encounter notes.  I have reviewed the above documentation for accuracy and completeness, and I agree with the above. -  Blanche Scovell d. Dellas Guard, NP-C

## 2023-04-14 ENCOUNTER — Encounter (INDEPENDENT_AMBULATORY_CARE_PROVIDER_SITE_OTHER): Payer: Self-pay | Admitting: Adult Health

## 2023-04-14 LAB — VITAMIN D 25 HYDROXY (VIT D DEFICIENCY, FRACTURES): Vit D, 25-Hydroxy: 114 ng/mL — ABNORMAL HIGH (ref 30.0–100.0)

## 2023-04-14 NOTE — Progress Notes (Signed)
Patient has an appointment to f/u with Tammy.  Nothing further needed.

## 2023-04-20 DIAGNOSIS — Z79899 Other long term (current) drug therapy: Secondary | ICD-10-CM | POA: Diagnosis not present

## 2023-04-20 DIAGNOSIS — F902 Attention-deficit hyperactivity disorder, combined type: Secondary | ICD-10-CM | POA: Diagnosis not present

## 2023-04-20 DIAGNOSIS — F419 Anxiety disorder, unspecified: Secondary | ICD-10-CM | POA: Diagnosis not present

## 2023-04-20 DIAGNOSIS — F331 Major depressive disorder, recurrent, moderate: Secondary | ICD-10-CM | POA: Diagnosis not present

## 2023-04-22 ENCOUNTER — Encounter: Payer: Self-pay | Admitting: Nurse Practitioner

## 2023-04-22 DIAGNOSIS — W19XXXD Unspecified fall, subsequent encounter: Secondary | ICD-10-CM

## 2023-04-22 DIAGNOSIS — M25561 Pain in right knee: Secondary | ICD-10-CM

## 2023-04-29 ENCOUNTER — Ambulatory Visit: Payer: BC Managed Care – PPO | Admitting: Adult Health

## 2023-04-29 VITALS — BP 116/80 | HR 99 | Ht 65.0 in | Wt 195.0 lb

## 2023-04-29 DIAGNOSIS — G4733 Obstructive sleep apnea (adult) (pediatric): Secondary | ICD-10-CM

## 2023-04-29 NOTE — Patient Instructions (Signed)
[facility] request or payment plan for BIPAP .  Begin BIPAP At bedtime. Wear all night long .  Healthy sleep regimen  Work on healthy weight loss  Follow up with Dr. Wynona Neat or Kiannah Grunow NP in 3 months and As needed

## 2023-04-29 NOTE — Assessment & Plan Note (Signed)
Severe OSA , Central sleep apnea  -BiPAP titration study shows optimal control on BiPAP ST with backup rate Begin BiPAP ST IPAP maximum 22, EPAP minimum 18 with a backup rate of 8. Have contacted homecare company for possible hardship or payment plan options for patient as this is a lifesaving treatment for her.   Plan  Patient Instructions  [facility] request or payment plan for BIPAP .  Begin BIPAP At bedtime. Wear all night long .  Healthy sleep regimen  Work on healthy weight loss  Follow up with Dr. Wynona Neat or Taro Hidrogo NP in 3 months and As needed

## 2023-04-29 NOTE — Progress Notes (Signed)
@Patient  ID: Angelica Weeks, female    DOB: August 31, 1971, 52 y.o.   MRN: [id number]  Chief Complaint  Patient presents with   Follow-up    Referring provider: [name], MD  HPI: 52 year old female seen for sleep consult January 13, 2023 to establish for sleep apnea  TEST/EVENTS :  NPSG August 05, 2022 shows severe sleep apnea the AHI is 64.3/hr  (126.4/hour in supine position), central sleep apnea was noted, Cheyne-Stokes breathing was noted  BiPAP titration study March 18, 2023 optimal pressure 22/18 cm H2O.,  Mild central sleep apnea noted CAI = 7.6/hour no significant oxygen desaturations were noted.-Recommended BiPAP ST therapy on 22/18 cm H2O with extra small size [employer] fullface evora ,. BR 8 bpm .   04/29/2023 Follow up : OSA  Patient presents for follow-up visit.  Patient was seen in January for sleep consult to assess for sleep apnea.  She had been diagnosed with severe sleep apnea on an in lab sleep study in August 2023.  Sleep study shows severe sleep apnea with AHI 64.3/hour, AHI of 1:26 position.  Central apnea and Cheyne-Stokes breathing was not noted.  Last visit patient was set up for a BiPAP titration study that was done on March 18, 2023 that showed optimal pressure at 22/18.  Central sleep apnea noted CAI equal 7.6/hour.  Patient was recommended for BiPAP ST therapy on 22 IPAP max at 18 cm EPAP minimum.  Backup rate of 8 bpm.  We discussed her sleep study results in detail.  Unfortunately patient says that she has a high deductible plan has been told by the homecare company that she will be responsible for over $3000 of the cost of the BiPAP machine.  She says she absolutely cannot afford this.  We went over in detail potential complications of untreated severe sleep apnea also discussed the complexity of her sleep apnea with central sleep apnea and Cheyne-Stokes breathing along with severe nocturnal hypoxemia.  Patient says that she just does not have the  money.  We have reached out to the DME for options for hardship and possible payment plan.     Allergies  Allergen Reactions   Aspirin Other (See Comments)    Burns stomach    Immunization History  Administered Date(s) Administered   PFIZER(Purple Top)SARS-COV-2 Vaccination 04/13/2020, 07/04/2020   PPD Test 02/13/2015, 04/13/2017   Pneumococcal Polysaccharide-23 10/06/2018   Tdap 02/13/2015    Past Medical History:  Diagnosis Date   ADD (attention deficit disorder)    Anxiety    Bipolar 1 disorder, depressed (HCC)    Cannabis abuse with psychotic disorder with delusions (HCC) 05/22/2018   Cellulitis of left eyelid 01/2014   Depression    Dysmenorrhea    Paranoid delusion (HCC) 05/22/2018   Schizophrenia (HCC)    Vitamin D deficiency     Tobacco History: Social History   Tobacco Use  Smoking Status Every Day   Packs/day: 1.00   Years: 33.00   Additional pack years: 0.00   Total pack years: 33.00   Types: Cigarettes   Start date: 23   Passive exposure: Current  Smokeless Tobacco Never   Ready to quit: Not Answered Counseling given: Not Answered   Outpatient Medications Prior to Visit  Medication Sig Dispense Refill   ALPRAZolam (XANAX) 1 MG tablet Take 1 mg by mouth at bedtime as needed for anxiety.     amphetamine-dextroamphetamine (ADDERALL) 20 MG tablet Take 1 tablet (20 mg total) by mouth 3 (three)  times daily. 90 tablet 0   atomoxetine (STRATTERA) 25 MG capsule Take 2 capsules (50 mg total) by mouth daily. 180 capsule 1   Cyanocobalamin 5000 MCG SUBL Place 1 tablet (5,000 mcg total) under the tongue once a week.     glycopyrrolate (ROBINUL) 1 MG tablet TAKE 1 TABLET BY MOUTH 3 TIMES DAILY. 270 tablet 1   lamoTRIgine (LAMICTAL) 200 MG tablet Take 2 tablets (400 mg total) by mouth at bedtime.     Magnesium 500 MG CAPS Take by mouth.     meloxicam (MOBIC) 15 MG tablet TAKE 1 TABLET BY MOUTH EVERY DAY 30 tablet 0   metFORMIN (GLUCOPHAGE-XR) 500 MG 24 hr  tablet Take 1 tab daily with largest meal of the day for 4 weeks; if tolerating well increase to twice daily with meals for insulin resistance. 180 tablet 3   methocarbamol (ROBAXIN) 750 MG tablet Take 1 tablet (750 mg total) by mouth 2 (two) times daily as needed for muscle spasms. 60 tablet 2   ondansetron (ZOFRAN) 4 MG tablet TAKE 1 TABLET BY MOUTH 3 TIMES A DAY IF NEEDED FOR NAUSEA 30 tablet 0   traZODone (DESYREL) 100 MG tablet Take 100 mg by mouth. Takes 200 mg at bedtime.     VITAMIN D PO Take 250 mcg by mouth daily.     VRAYLAR 6 MG CAPS TAKE 1 CAPSULE BY MOUTH DAILY 30 capsule 3   No facility-administered medications prior to visit.     Review of Systems:   Constitutional:   No  weight loss, night sweats,  Fevers, chills,  +fatigue, or  lassitude.  HEENT:   No headaches,  Difficulty swallowing,  Tooth/dental problems, or  Sore throat,                No sneezing, itching, ear ache, nasal congestion, post nasal drip,   CV:  No chest pain,  Orthopnea, PND, swelling in lower extremities, anasarca, dizziness, palpitations, syncope.   GI  No heartburn, indigestion, abdominal pain, nausea, vomiting, diarrhea, change in bowel habits, loss of appetite, bloody stools.   Resp: No shortness of breath with exertion or at rest.  No excess mucus, no productive cough,  No non-productive cough,  No coughing up of blood.  No change in color of mucus.  No wheezing.  No chest wall deformity  Skin: no rash or lesions.  GU: no dysuria, change in color of urine, no urgency or frequency.  No flank pain, no hematuria   MS:  No joint pain or swelling.  No decreased range of motion.  No back pain.    Physical Exam  BP 116/80 (BP Location: Left Arm, Cuff Size: Large)   Pulse 99   Ht 5\' 5"  (1.651 m)   Wt 195 lb (88.5 kg)   LMP  (LMP Unknown) Comment: "I've not had a period in 3-4 years"  SpO2 99%   BMI 32.45 kg/m   GEN: A/Ox3; pleasant , NAD, well nourished    HEENT:  [place]/AT,  EACs-clear,  TMs-wnl, NOSE-clear, THROAT-clear, no lesions, no postnasal drip or exudate noted. Class 2-3 MP airway   NECK:  Supple w/ fair ROM; no JVD; normal carotid impulses w/o bruits; no thyromegaly or nodules palpated; no lymphadenopathy.    RESP  Clear  P & A; w/o, wheezes/ rales/ or rhonchi. no accessory muscle use, no dullness to percussion  CARD:  RRR, no m/r/g, no peripheral edema, pulses intact, no cyanosis or clubbing.  GI:   Soft &  nt; nml bowel sounds; no organomegaly or masses detected.   Musco: Warm bil, no deformities or joint swelling noted.   Neuro: alert, no focal deficits noted.    Skin: Warm, no lesions or rashes    Lab Results:  CBC    Component Value Date/Time   WBC 8.3 01/22/2023 1605   RBC 4.02 01/22/2023 1605   HGB 13.0 01/22/2023 1605   HGB 12.8 07/01/2022 1533   HCT 37.7 01/22/2023 1605   HCT 36.4 07/01/2022 1533   PLT 303 01/22/2023 1605   PLT 334 07/01/2022 1533   MCV 93.8 01/22/2023 1605   MCV 91 07/01/2022 1533   MCH 32.3 01/22/2023 1605   MCHC 34.5 01/22/2023 1605   RDW 12.6 01/22/2023 1605   RDW 14.1 07/01/2022 1533   LYMPHSABS 2,747 01/22/2023 1605   LYMPHSABS 2.4 07/01/2022 1533   MONOABS 420 04/13/2017 1117   EOSABS 91 01/22/2023 1605   EOSABS 0.2 07/01/2022 1533   BASOSABS 50 01/22/2023 1605   BASOSABS 0.0 07/01/2022 1533    BMET    Component Value Date/Time   NA 138 01/22/2023 1605   NA 134 07/01/2022 1533   K 4.2 01/22/2023 1605   CL 101 01/22/2023 1605   CO2 28 01/22/2023 1605   GLUCOSE 88 01/22/2023 1605   BUN 20 01/22/2023 1605   BUN 9 07/01/2022 1533   CREATININE 0.88 01/22/2023 1605   CALCIUM 9.9 01/22/2023 1605   GFRNONAA 73 01/22/2021 0951   GFRAA 85 01/22/2021 0951    BNP No results found for: "BNP"  ProBNP No results found for: "PROBNP"  Imaging: No results found.        No data to display          No results found for: "NITRICOXIDE"      Assessment & Plan:   OSA (obstructive sleep  apnea) Severe OSA , Central sleep apnea  -BiPAP titration study shows optimal control on BiPAP ST with backup rate Begin BiPAP ST IPAP maximum 22, EPAP minimum 18 with a backup rate of 8. Have contacted homecare company for possible hardship or payment plan options for patient as this is a lifesaving treatment for her.   Plan  Patient Instructions  [facility] request or payment plan for BIPAP .  Begin BIPAP At bedtime. Wear all night long .  Healthy sleep regimen  Work on healthy weight loss  Follow up with Dr. Wynona Neat or Abel Hageman NP in 3 months and As needed        Rubye Oaks, NP 04/29/2023

## 2023-05-12 ENCOUNTER — Encounter (INDEPENDENT_AMBULATORY_CARE_PROVIDER_SITE_OTHER): Payer: Self-pay | Admitting: Adult Health

## 2023-05-12 ENCOUNTER — Ambulatory Visit (INDEPENDENT_AMBULATORY_CARE_PROVIDER_SITE_OTHER): Payer: BC Managed Care – PPO | Admitting: Adult Health

## 2023-05-12 VITALS — BP 112/74 | HR 78 | Temp 98.1°F | Ht 65.0 in | Wt 190.0 lb

## 2023-05-12 DIAGNOSIS — E88819 Insulin resistance, unspecified: Secondary | ICD-10-CM

## 2023-05-12 DIAGNOSIS — E669 Obesity, unspecified: Secondary | ICD-10-CM

## 2023-05-12 DIAGNOSIS — G4733 Obstructive sleep apnea (adult) (pediatric): Secondary | ICD-10-CM

## 2023-05-12 DIAGNOSIS — Z6831 Body mass index (BMI) 31.0-31.9, adult: Secondary | ICD-10-CM

## 2023-05-12 DIAGNOSIS — E559 Vitamin D deficiency, unspecified: Secondary | ICD-10-CM

## 2023-05-12 NOTE — Progress Notes (Signed)
WEIGHT SUMMARY AND BIOMETRICS  Vitals Temp: 98.1 F (36.7 C) BP: 112/74 Pulse Rate: 78 SpO2: 97 %   Anthropometric Measurements Height: 5\' 5"  (1.651 m) Weight: 190 lb (86.2 kg) BMI (Calculated): 31.62 Weight at Last Visit: 189lb Weight Lost Since Last Visit: 0 Weight Gained Since Last Visit: 1lb Starting Weight: 222lb Total Weight Loss (lbs): 32 lb (14.5 kg)   Body Composition  Body Fat %: 42.1 % Fat Mass (lbs): 80 lbs Muscle Mass (lbs): 104.4 lbs Total Body Water (lbs): 70.4 lbs Visceral Fat Rating : 10   Other Clinical Data Fasting: no Labs: no Today's Visit #: 10 Starting Date: 05/28/22    Chief Complaint:   OBESITY Angelica Weeks is here to discuss her progress with her obesity treatment plan. She is on the the Category 3 Plan and states she is following her eating plan approximately 50 % of the time. She states she is not currently exercising.   Interim History:  04/13/2023 Vit D Level 114 She was informed to STOP all Vit D supplementation- per pt was she was taking OTC Vit D3 5,000 IU QD )Per PCP instructions).  Stress- Her insurance coverage changed in 2024- now her copays have increased and she has a high deductible plan- unable to afford home BiPAP.  Sleep- unable to afford home BiPAP  Exercise-not currently exercising, agreeable to start walking "Alison Stalling" (52 yr old [place])  Hydration-she estimates to drink >64 oz water/day  Subjective:   1. Insulin resistance  Latest Reference Range & Units 01/22/22 09:56 07/01/22 15:33 01/22/23 16:05  INSULIN uIU/mL 20.8 (H) 9.2 10.9  (H): Data is abnormally high She endorses stable appetite levels. Her insurance will not cover GLP-1 or GIP/GLP-1 for Obesity tx.  2. OSA (obstructive sleep apnea) 04/29/2023 Pulmonology OV: NPSG August 05, 2022 shows severe sleep apnea the AHI is 64.3/hr  (126.4/hour in supine position), central sleep apnea was noted, Cheyne-Stokes breathing was noted   BiPAP titration study  March 18, 2023 optimal pressure 22/18 cm H2O.,  Mild central sleep apnea noted CAI = 7.6/hour no significant oxygen desaturations were noted.-Recommended BiPAP ST therapy on 22/18 cm H2O with extra small size Fisher [employer] fullface evora ,. BR 8 bpm .    04/29/2023 Follow up : OSA  Patient presents for follow-up visit.  Patient was seen in January for sleep consult to assess for sleep apnea.  She had been diagnosed with severe sleep apnea on an in lab sleep study in August 2023.  Sleep study shows severe sleep apnea with AHI 64.3/hour, AHI of 1:26 position.  Central apnea and Cheyne-Stokes breathing was not noted.  Last visit patient was set up for a BiPAP titration study that was done on March 18, 2023 that showed optimal pressure at 22/18.  Central sleep apnea noted CAI equal 7.6/hour.  Patient was recommended for BiPAP ST therapy on 22 IPAP max at 18 cm EPAP minimum.  Backup rate of 8 bpm.  We discussed her sleep study results in detail.  Unfortunately patient says that she has a high deductible plan has been told by the homecare company that she will be responsible for over $3000 of the cost of the BiPAP machine.  She says she absolutely cannot afford this.  We went over in detail potential complications of untreated severe sleep apnea also discussed the complexity of her sleep apnea with central sleep apnea and Cheyne-Stokes breathing along with severe nocturnal hypoxemia.  Patient says that she just does not have  the money.  We have reached out to the DME for options for hardship and possible payment plan.    3. Vit D Def  Latest Reference Range & Units 04/13/23 15:33  Vitamin D, 25-Hydroxy 30.0 - 100.0 ng/mL 114.0 (H)  (H): Data is abnormally highVitamin D deficiency 01/26/2023- She stopped OTC Vit D3 10,000 IU QD per [facility] instructions She then re-started OTC Vit D 3 5,000 IU QD on/about February 13, 2023 per PCP instructions. INSTRUCTED BY [facility] TO STOP ALL VIT D SUPPLEMENTATION on 04/14/23- she has  remained off since then. She denies N/V/Muscle Weakness  Assessment/Plan:   1. Insulin resistance Increase protein and regular walking.  2. OSA (obstructive sleep apnea) OSA (obstructive sleep apnea) Severe OSA , Central sleep apnea  -BiPAP titration study shows optimal control on BiPAP ST with backup rate Begin BiPAP ST IPAP maximum 22, EPAP minimum 18 with a backup rate of 8. Have contacted homecare company for possible hardship or payment plan options for patient as this is a lifesaving treatment for her.    Plan  Patient Instructions  [facility] request or payment plan for BIPAP .  Begin BIPAP At bedtime. Wear all night long .  Healthy sleep regimen  Work on healthy weight loss   3. Vitamin D deficiency Remain OFF all Vit D supplementation- check labs at next OV  4. Obesity with current BMI of 31.62  Angelica Weeks is currently in the action stage of change. As such, her goal is to continue with weight loss efforts. She has agreed to the Category 3 Plan.   Exercise goals: For substantial health benefits, adults should do at least 150 minutes (2 hours and 30 minutes) a week of moderate-intensity, or 75 minutes (1 hour and 15 minutes) a week of vigorous-intensity aerobic physical activity, or an equivalent combination of moderate- and vigorous-intensity aerobic activity. Aerobic activity should be performed in episodes of at least 10 minutes, and preferably, it should be spread throughout the week.  Walk "[place]" at least 69ming/day  Behavioral modification strategies: increasing lean protein intake, decreasing simple carbohydrates, increasing vegetables, increasing water intake, no skipping meals, meal planning and cooking strategies, and planning for success.  Angelica Weeks has agreed to follow-up with our clinic in 4 weeks. She was informed of the importance of frequent follow-up visits to maximize her success with intensive lifestyle modifications for her multiple health conditions.     Objective:   Blood pressure 112/74, pulse 78, temperature 98.1 F (36.7 C), height 5\' 5"  (1.651 m), weight 190 lb (86.2 kg), SpO2 97 %. Body mass index is 31.62 kg/m.  General: Cooperative, alert, well developed, in no acute distress. HEENT: Conjunctivae and lids unremarkable. Cardiovascular: Regular rhythm.  Lungs: Normal work of breathing. Neurologic: No focal deficits.   Lab Results  Component Value Date   CREATININE 0.88 01/22/2023   BUN 20 01/22/2023   NA 138 01/22/2023   K 4.2 01/22/2023   CL 101 01/22/2023   CO2 28 01/22/2023   Lab Results  Component Value Date   ALT 12 01/22/2023   AST 14 01/22/2023   ALKPHOS 120 07/01/2022   BILITOT 0.2 01/22/2023   Lab Results  Component Value Date   HGBA1C 5.3 01/22/2023   HGBA1C 5.0 07/01/2022   HGBA1C 5.0 01/22/2022   HGBA1C 5.0 01/22/2021   HGBA1C 4.6 10/10/2019   Lab Results  Component Value Date   INSULIN 9.2 07/01/2022   Lab Results  Component Value Date   TSH 1.18 01/22/2023   Lab  Results  Component Value Date   CHOL 144 01/22/2023   HDL 65 01/22/2023   LDLCALC 61 01/22/2023   TRIG 93 01/22/2023   CHOLHDL 2.2 01/22/2023   Lab Results  Component Value Date   VD25OH 114.0 (H) 04/13/2023   VD25OH 122 (H) 01/22/2023   VD25OH 81.0 07/01/2022   Lab Results  Component Value Date   WBC 8.3 01/22/2023   HGB 13.0 01/22/2023   HCT 37.7 01/22/2023   MCV 93.8 01/22/2023   PLT 303 01/22/2023   Lab Results  Component Value Date   IRON 92 01/22/2021   TIBC 391 01/22/2021   Attestation Statements:   Reviewed by clinician on day of visit: allergies, medications, problem list, medical history, surgical history, family history, social history, and previous encounter notes.  Time spent on visit including pre-visit chart review and post-visit care and charting was 28 minutes.   I have reviewed the above documentation for accuracy and completeness, and I agree with the above. -  Tifini Reeder d. Chardae Mulkern, NP-C

## 2023-05-13 ENCOUNTER — Telehealth: Payer: Self-pay | Admitting: Adult Health

## 2023-05-13 ENCOUNTER — Other Ambulatory Visit: Payer: Self-pay | Admitting: Nurse Practitioner

## 2023-05-13 DIAGNOSIS — M25561 Pain in right knee: Secondary | ICD-10-CM

## 2023-05-13 DIAGNOSIS — G4731 Primary central sleep apnea: Secondary | ICD-10-CM

## 2023-05-15 NOTE — Telephone Encounter (Signed)
Tammy, please advise on this with the diagnoses stated.

## 2023-05-19 ENCOUNTER — Ambulatory Visit: Payer: BC Managed Care – PPO | Admitting: Nurse Practitioner

## 2023-05-20 DIAGNOSIS — F900 Attention-deficit hyperactivity disorder, predominantly inattentive type: Secondary | ICD-10-CM | POA: Diagnosis not present

## 2023-05-20 DIAGNOSIS — F411 Generalized anxiety disorder: Secondary | ICD-10-CM | POA: Diagnosis not present

## 2023-05-20 DIAGNOSIS — F3132 Bipolar disorder, current episode depressed, moderate: Secondary | ICD-10-CM | POA: Diagnosis not present

## 2023-05-25 NOTE — Telephone Encounter (Signed)
That is fine, it says central sleep apnea in my HPI. Where does the dx need [place] ?

## 2023-05-26 ENCOUNTER — Encounter: Payer: Self-pay | Admitting: Nurse Practitioner

## 2023-05-26 ENCOUNTER — Ambulatory Visit (INDEPENDENT_AMBULATORY_CARE_PROVIDER_SITE_OTHER): Payer: BC Managed Care – PPO | Admitting: Nurse Practitioner

## 2023-05-26 VITALS — BP 110/72 | HR 103 | Temp 97.9°F | Ht 65.0 in | Wt 196.2 lb

## 2023-05-26 DIAGNOSIS — R61 Generalized hyperhidrosis: Secondary | ICD-10-CM | POA: Diagnosis not present

## 2023-05-26 DIAGNOSIS — M25561 Pain in right knee: Secondary | ICD-10-CM

## 2023-05-26 DIAGNOSIS — W19XXXD Unspecified fall, subsequent encounter: Secondary | ICD-10-CM

## 2023-05-26 DIAGNOSIS — M545 Low back pain, unspecified: Secondary | ICD-10-CM | POA: Diagnosis not present

## 2023-05-26 DIAGNOSIS — R296 Repeated falls: Secondary | ICD-10-CM

## 2023-05-26 NOTE — Progress Notes (Signed)
Ibu 800 take with tylenol Refer to pain mangement Send for new mri to [facility]   Complete Physical  Assessment and Plan:  Denajah was seen today for annual exam. Diagnoses and all orders for this visit:  Encounter for Annual Physical Exam with abnormal findings Due annually  Health Maintenance reviewed Healthy lifestyle reviewed and goals set   Gastroesophageal reflux disease, unspecified whether esophagitis present No suspected reflux complications (Barret/stricture). Lifestyle modification:  wt loss, avoid meals 2-3h before bedtime. Consider eliminating food triggers:  chocolate, caffeine, EtOH, acid/spicy food.   Vitamin D deficiency Continue supplementation Monitor levels  Bipolar 1 disorder, depressed (HCC) Recurrent major depressive disorder, in partial remission (HCC) Follows with Dr Deatra Robinson Continue medications as directed Continue to monitor  Attention deficit disorder (ADD) without hyperactivity Doing well at this time Psych managing adderall   Smoker unmotivated to quit One pack a day Smoking cessation instruction/counseling given:  counseled patient on the dangers of tobacco use, advised patient to stop smoking, and reviewed strategies to maximize success Not ready to quit at this time  History of cannabis dependence/abuse (HCC) Denies any current use           Obesity - BMI 36 Discussed appropriate BMI Diet modification. Physical activity. Encouraged/praised to build confidence.  History of elevated prolactin Had normal MRI, no checks in last 2 years  Denies nipple discharge      Screening cholesterol level -     Lipid panel  Screening for thyroid disorder -     TSH  Screening for diabetes mellitus -     Hemoglobin A1c -     Insulin, random  Screening for blood or protein in urine -     Urinalysis w microscopic -     Microalbumin   Screening for cardiovascular condition -     EKG 12-Lead  B12 deficiency Continue  supplement Monitor levels  Colon cancer screening Adopted, doesn't know family history Defers colonoscopy - cologuard ordered  Hyperhidrosis Improved Increase Glycopyrrolate to 1 mg TID but  may increase to 2 mg during the HS dose. Continue to monitor  OSA Follows [facility] Has Bipap machine study scheduled Unable to tolerate CPAP machine.   Low back pain Lumbar x-ray Start Methocarbamol as directed Continue to monitor  Screening for breast cancer Mammogram ordered   No orders of the defined types were placed in this encounter.    No orders of the defined types were placed in this encounter.   Notify office for further evaluation and treatment, questions or concerns if any reported s/s fail to improve.   The patient was advised to call back or seek an in-person evaluation if any symptoms worsen or if the condition fails to improve as anticipated.   Further disposition pending results of labs. Discussed med's effects and SE's.    I discussed the assessment and treatment plan with the patient. The patient was provided an opportunity to ask questions and all were answered. The patient agreed with the plan and demonstrated an understanding of the instructions.  Discussed med's effects and SE's. Screening labs and tests as requested with regular follow-up as recommended.  I provided 35 minutes of face-to-face time during this encounter including counseling, chart review, and critical decision making was preformed.   Future Appointments  Date Time Provider Department Center  06/22/2023  3:00 PM Julaine Fusi, NP [facility] None  01/25/2024  3:00 PM [name], NP [facility] None     HPI  52 y.o. female  presents for a complete physical and follow up for has ADD (attention deficit disorder); Bipolar 1 disorder, depressed (HCC); GERD (gastroesophageal reflux disease); Smoker unmotivated to quit; Obesity (BMI 30-39.9); Elevated prolactin level; Insulin  resistance; Fatigue; Menopause; Metabolic syndrome; Moderate recurrent major depression (HCC); Sleep disturbance; Daytime somnolence; Elevated fasting blood sugar; Vitamin D deficiency; B12 deficiency; Hypersomnia, persistent; Fatigue due to depression; Insomnia due to other mental disorder; Nocturia; Snoring; Class 2 drug-induced obesity with serious comorbidity and body mass index (BMI) of 38.0 to 38.9 in adult; and OSA (obstructive sleep apnea) on their problem list..  She is with long term partner, 2 kids 16 and 18.   She was seen 12/22/22 for acute pain of right knee and low back pain.  She was treated with Cyclobenzaprine.  She did not feel as though this was effective.  She states pain is most noticeable at night after working.  She denies any childhood sports injury or recent trauma, fall, injury, or hx of MVA.    She was also worked up for hyperhidrosis.  Now taking Glycopyrrolate.  Reports effectiveness but feels it could be adjusted to completely control symptoms.  She does report some dry mouth.   She follows with Dr. Pleas Patricia [facility].  She has  been having GERD, not controlled by tums, ranitidine worked well prior to being taken off the market. Takes OTC Famotidine with relief.   She is a current smoker and smokes a pack a day since 1990, thinking about about reducing but not ready, fear of weight gain and admits to habitual use. Has failed chantix and wellbutrin in the past.   She is seeing Deatra Robinson, MD for bipolar depression, ADD, anxiety.  Bipolar currently doing well on vraylar and lamictal, adderall XR for ADD, and PRN xanax. She is on trazodone in the evening. She reports doing much better with current regimen though does report ongoing fatigue.   She has hx of elevated prolactin up to 25 in 12/17/2018, had normal MRI 01/15/2019.   BMI is Body mass index is 32.65 kg/m., she has been working on diet and exercise.  Wt Readings from Last 3 Encounters:  05/26/23 196 lb 3.2 oz (89  kg)  05/12/23 190 lb (86.2 kg)  04/29/23 195 lb (88.5 kg)   Her blood pressure has been controlled at home, today their BP is BP: 110/72 She does workout. She denies chest pain, shortness of breath, dizziness.   She is not on cholesterol medication and denies myalgias. Her cholesterol is at goal. The cholesterol last visit was:   Lab Results  Component Value Date   CHOL 144 01/22/2023   HDL 65 01/22/2023   LDLCALC 61 01/22/2023   TRIG 93 01/22/2023   CHOLHDL 2.2 01/22/2023   She has been working on diet and exercise for glucose management, she is on mounjaro 10 mg/week, she is not on bASA, she is not on ACE/ARB and denies increased appetite, nausea, paresthesia of the feet, polydipsia, polyuria, visual disturbances, vomiting and weight loss. Last A1C in the office was:  Lab Results  Component Value Date   HGBA1C 5.3 01/22/2023   Last GFR: Lab Results  Component Value Date   EGFR 81 01/22/2023    Patient is on Vitamin D supplement, taking 16109 IU Lab Results  Component Value Date   VD25OH 114.0 (H) 04/13/2023     She reports newly on B12 supplement -  Lab Results  Component Value Date   VITAMINB12 1,946 (  H) 07/01/2022     Current Medications:  Current Outpatient Medications on File Prior to Visit  Medication Sig Dispense Refill  . ALPRAZolam (XANAX) 1 MG tablet Take 1 mg by mouth at bedtime as needed for anxiety.    Marland Kitchen amphetamine-dextroamphetamine (ADDERALL) 20 MG tablet Take 1 tablet (20 mg total) by mouth 3 (three) times daily. 90 tablet 0  . atomoxetine (STRATTERA) 25 MG capsule Take 2 capsules (50 mg total) by mouth daily. 180 capsule 1  . Cyanocobalamin 5000 MCG SUBL Place 1 tablet (5,000 mcg total) under the tongue once a week.    Marland Kitchen glycopyrrolate (ROBINUL) 1 MG tablet TAKE 1 TABLET BY MOUTH 3 TIMES DAILY. 270 tablet 1  . lamoTRIgine (LAMICTAL) 200 MG tablet Take 2 tablets (400 mg total) by mouth at bedtime.    . Magnesium 500 MG CAPS Take by mouth.    .  metFORMIN (GLUCOPHAGE-XR) 500 MG 24 hr tablet Take 1 tab daily with largest meal of the day for 4 weeks; if tolerating well increase to twice daily with meals for insulin resistance. 180 tablet 3  . ondansetron (ZOFRAN) 4 MG tablet TAKE 1 TABLET BY MOUTH 3 TIMES A DAY IF NEEDED FOR NAUSEA 30 tablet 0  . traZODone (DESYREL) 100 MG tablet Take 100 mg by mouth. Takes 200 mg at bedtime.    Marland Kitchen VITAMIN D PO Take 250 mcg by mouth daily.    Marland Kitchen VRAYLAR 6 MG CAPS TAKE 1 CAPSULE BY MOUTH DAILY 30 capsule 3  . meloxicam (MOBIC) 15 MG tablet TAKE 1 TABLET BY MOUTH EVERY DAY (Patient not taking: Reported on 05/26/2023) 30 tablet 0  . methocarbamol (ROBAXIN) 750 MG tablet Take 1 tablet (750 mg total) by mouth 2 (two) times daily as needed for muscle spasms. (Patient not taking: Reported on 05/26/2023) 60 tablet 2   No current facility-administered medications on file prior to visit.   Allergies:  Allergies  Allergen Reactions  . Aspirin Other (See Comments)    Burns stomach   Medical History:  She has ADD (attention deficit disorder); Bipolar 1 disorder, depressed (HCC); GERD (gastroesophageal reflux disease); Smoker unmotivated to quit; Obesity (BMI 30-39.9); Elevated prolactin level; Insulin resistance; Fatigue; Menopause; Metabolic syndrome; Moderate recurrent major depression (HCC); Sleep disturbance; Daytime somnolence; Elevated fasting blood sugar; Vitamin D deficiency; B12 deficiency; Hypersomnia, persistent; Fatigue due to depression; Insomnia due to other mental disorder; Nocturia; Snoring; Class 2 drug-induced obesity with serious comorbidity and body mass index (BMI) of 38.0 to 38.9 in adult; and OSA (obstructive sleep apnea) on their problem list.  Health Maintenance:   Immunization History  Administered Date(s) Administered  . PFIZER(Purple Top)SARS-COV-2 Vaccination 04/13/2020, 07/04/2020  . PPD Test 02/13/2015, 04/13/2017  . Pneumococcal Polysaccharide-23 10/06/2018  . Tdap 02/13/2015   Health  Maintenance  Topic Date Due  . Colonoscopy  Never done  . COVID-19 Vaccine (3 - Pfizer risk series) 08/01/2020  . PAP SMEAR-Modifier  12/17/2021  . INFLUENZA VACCINE  07/16/2023  . MAMMOGRAM  03/22/2024  . DTaP/Tdap/Td (2 - Td or Tdap) 02/12/2025  . HPV VACCINES  Aged Out  . Hepatitis C Screening  Discontinued  . HIV Screening  Discontinued    Influenza: Declines  Pneumococcal: 2019-smoker  Last colonoscopy: DUE - Defers colonoscopy - cologuard order placed Last mammogram: never - due - ordered  Last pap smear/pelvic exam: 12/2018 - postmenopausal, Ammenorrhea 3-4years. Follows with Dr Hyacinth Meeker GYN - due for apt. She will schedule   Names of Other Physician/Practitioners you currently  use: 1. [facility] here for primary care 2. Eye Exam: last 2023, goes annually  3. Dental Exam [facility], last 2023, going q69m   Patient Care Team: [name], MD as PCP - General (Internal Medicine) Sallye Lat, MD as Consulting Physician (Optometry) Mitchel Honour, DO as Consulting Physician (Obstetrics and Gynecology) Andee Poles, MD as Consulting Physician (Psychiatry) Jerene Bears, MD as Consulting Physician (Obstetrics and Gynecology)  Surgical History:  She has a past surgical history that includes Wisdom tooth extraction. Family History:  HerShe was adopted. Family history is unknown by patient. Social History:  She reports that she has been smoking cigarettes. She started smoking about 34 years ago. She has a 33.00 pack-year smoking history. She has been exposed to tobacco smoke. She has never used smokeless tobacco. She reports that she does not currently use alcohol. She reports that she does not currently use drugs after having used the following drugs: Marijuana.  Review of Systems: Review of Systems  Constitutional:  Positive for malaise/fatigue (chronic). Negative for weight loss.  HENT:  Negative for hearing  loss, sore throat and tinnitus.   Eyes:  Negative for blurred vision and double vision.  Respiratory:  Negative for cough, sputum production, shortness of breath and wheezing.   Cardiovascular:  Negative for chest pain, palpitations, orthopnea, claudication, leg swelling and PND.  Gastrointestinal:  Positive for heartburn (worse at night). Negative for abdominal pain, blood in stool, constipation, diarrhea, melena, nausea and vomiting.  Genitourinary: Negative.   Musculoskeletal:  Negative for falls, joint pain and myalgias.  Skin:  Negative for rash.  Neurological:  Negative for dizziness, tingling, sensory change, weakness and headaches.  Endo/Heme/Allergies:  Negative for polydipsia.  Psychiatric/Behavioral:  Negative for depression (improved), memory loss, substance abuse and suicidal ideas. The patient is not nervous/anxious and does not have insomnia.   All other systems reviewed and are negative.   Physical Exam: Estimated body mass index is 32.65 kg/m as calculated from the following:   Height as of this encounter: 5\' 5"  (1.651 m).   Weight as of this encounter: 196 lb 3.2 oz (89 kg). BP 110/72   Pulse (!) 103   Temp 97.9 F (36.6 C)   Ht 5\' 5"  (1.651 m)   Wt 196 lb 3.2 oz (89 kg)   LMP  (LMP Unknown) Comment: "I've not had a period in 3-4 years"  SpO2 97%   BMI 32.65 kg/m  General Appearance: Well nourished, in no apparent distress.  Eyes: PERRLA, EOMs, conjunctiva no swelling or erythema Sinuses: No Frontal/maxillary tenderness  ENT/Mouth: Ext aud canals clear, normal TM bilaterally. Good dentition. No erythema, swelling, or exudate on post pharynx. Tonsils not swollen or erythematous. Hearing normal.  Neck: Supple, thyroid normal. No bruits  Respiratory: Respiratory effort normal, BS equal bilaterally without rales, rhonchi, wheezing or stridor.  Cardio: RRR without murmurs, rubs or gallops. Brisk peripheral pulses without edema.  Chest: symmetric, with normal  excursions and percussion.  Breasts: breasts appear normal, no suspicious masses, no skin or nipple changes or axillary nodes. Abdomen: Soft, obese, nontender, no guarding, rebound, hernias, masses, or organomegaly.  Lymphatics: Non tender without lymphadenopathy.  Genitourinary: Defer to GYN, no concerns Musculoskeletal: Full ROM all peripheral extremities,5/5 strength, and normal gait.  Skin: Warm, dry without rashes, lesions, ecchymosis. Neuro: Cranial nerves intact, reflexes equal bilaterally. Normal muscle tone, no cerebellar symptoms. Sensation intact.  Psych: Awake and oriented X 3, normal affect, Insight and Judgment appropriate.  EKG: Sinus tachycardia  [name], NP 10:21 AM [facility]

## 2023-05-26 NOTE — Telephone Encounter (Signed)
Called and spoke with [name]  He said dx needs to be changed on the order I placed new order with CSA dx  Please sign, thanks!

## 2023-05-27 ENCOUNTER — Encounter: Payer: Self-pay | Admitting: Nurse Practitioner

## 2023-05-27 ENCOUNTER — Telehealth: Payer: Self-pay | Admitting: Adult Health

## 2023-05-27 MED ORDER — OXYBUTYNIN CHLORIDE 5 MG PO TABS
5.0000 mg | ORAL_TABLET | Freq: Two times a day (BID) | ORAL | 0 refills | Status: DC
Start: 2023-05-27 — End: 2023-06-22

## 2023-05-27 MED ORDER — IBUPROFEN 800 MG PO TABS
800.0000 mg | ORAL_TABLET | Freq: Three times a day (TID) | ORAL | 0 refills | Status: DC | PRN
Start: 2023-05-27 — End: 2023-06-26

## 2023-05-27 NOTE — Patient Instructions (Signed)
RICE Therapy for Routine Care of Injuries Many injuries can be cared for with rest, ice, compression, and elevation (RICE therapy). This includes: Resting the injured body part. Putting ice on the injury. Putting pressure (compression) on the injury. Raising the injured part (elevation). Using RICE therapy can help to lessen pain and swelling. Supplies needed: Ice. Plastic bag. Towel. Elastic bandage. Pillow or pillows to raise your injured body part. How to care for your injury with RICE therapy Rest Try to rest the injured part of your body. You can go back to your normal activities when your doctor says it is okay to do them and when you can do them without pain. If you rest the injury too much, it may not heal as well. Some injuries heal better with early movement instead of resting for too long. Ask your doctor if you should do exercises to help your injury get better. Ice  If told, put ice on the injured area. To do this: Put ice in a plastic bag. Place a towel between your skin and the bag. Leave the ice on for 20 minutes, 2-3 times a day. Take off the ice if your skin turns bright red. This is very important. If you cannot feel pain, heat, or cold, you have a greater risk of damage to the area. Do not put ice on your bare skin. Use ice for as many days as your doctor tells you to use it. Compression Put pressure on the injured area. This can be done with an elastic bandage. If this type of bandage has been put on your injury: Follow instructions on the package the bandage came in about how to use it. Do not wrap the bandage too tightly. Wrap the bandage more loosely if part of your body beyond the bandage is blue, swollen, cold, painful, or loses feeling. Take off the bandage and put it on again every 3-4 hours or as told by your doctor. See your doctor if the bandage seems to make your problems worse.  Elevation Raise the injured area above the level of your heart while you  are sitting or lying down. Follow these instructions at home: If your symptoms get worse or last a long time, make a follow-up appointment with your doctor. You may need to have imaging tests, such as X-rays or an MRI. If you have imaging tests, ask how to get your results when they are ready. Return to your normal activities when your doctor says that it is safe. Keep all follow-up visits. Contact a doctor if: You keep having pain and swelling. Your symptoms get worse. Get help right away if: You have sudden, very bad pain at your injury or lower than your injury. You have redness or more swelling around your injury. You have tingling or numbness at your injury or lower than your injury, and it does not go away when you take off the bandage. Summary Many injuries can be cared for using rest, ice, compression, and elevation (RICE therapy). You can go back to your normal activities when your doctor says it is okay and when you can do them without pain. Put ice on the injured area as told by your doctor. Get help if your symptoms get worse or if you keep having pain and swelling. This information is not intended to replace advice given to you by your health care provider. Make sure you discuss any questions you have with your health care provider. Document Revised: 09/20/2020 Document Reviewed: 09/20/2020  Elsevier Patient Education  2024 [employer]  

## 2023-05-27 NOTE — Telephone Encounter (Signed)
I received a message from [place] with [employer] about this Bipap order placed   The new order has been received and reviewed. Its still needing some corrections.  Per intake team we need to have the DX of OSA removed from the order. The DX needs to be Central Sleep Apnea. CSA is noted on the order , but we need to have the OSA Dx removed.   Also the line that says to ensure she can have a low payment plan to treat OSA or any other listing of OSA.     OSA can stay on there as long as CSA is listed before it.   Anywhere that OSA is listed CSA either needs to be in its place or listed before it ( CSA, OSA) for BIPAP St orders.

## 2023-06-04 ENCOUNTER — Other Ambulatory Visit: Payer: BC Managed Care – PPO

## 2023-06-15 ENCOUNTER — Other Ambulatory Visit: Payer: Self-pay | Admitting: Nurse Practitioner

## 2023-06-15 DIAGNOSIS — M545 Low back pain, unspecified: Secondary | ICD-10-CM

## 2023-06-15 DIAGNOSIS — M25561 Pain in right knee: Secondary | ICD-10-CM

## 2023-06-22 ENCOUNTER — Other Ambulatory Visit: Payer: Self-pay | Admitting: Nurse Practitioner

## 2023-06-22 ENCOUNTER — Ambulatory Visit (INDEPENDENT_AMBULATORY_CARE_PROVIDER_SITE_OTHER): Payer: BC Managed Care – PPO | Admitting: Adult Health

## 2023-06-22 DIAGNOSIS — R61 Generalized hyperhidrosis: Secondary | ICD-10-CM

## 2023-06-24 ENCOUNTER — Other Ambulatory Visit: Payer: Self-pay | Admitting: Nurse Practitioner

## 2023-06-24 DIAGNOSIS — M25561 Pain in right knee: Secondary | ICD-10-CM

## 2023-06-24 DIAGNOSIS — M545 Low back pain, unspecified: Secondary | ICD-10-CM

## 2023-07-01 ENCOUNTER — Other Ambulatory Visit: Payer: Self-pay | Admitting: Nurse Practitioner

## 2023-07-07 NOTE — Telephone Encounter (Signed)
07/06/23 we faxed a request to [facility] to get the Prior Auth and reschedule the patient, if necessary. I will advise patient.

## 2023-07-08 NOTE — Telephone Encounter (Signed)
Spoke with [facility]- patient has been scheduled for MRI Right Knee on 07-16-23 at 3:15pm. Per my contact Courtney, no prior auth was required via insurance. I will make patient aware.

## 2023-07-15 ENCOUNTER — Encounter: Payer: Self-pay | Admitting: Nurse Practitioner

## 2023-07-15 ENCOUNTER — Ambulatory Visit (INDEPENDENT_AMBULATORY_CARE_PROVIDER_SITE_OTHER): Payer: BC Managed Care – PPO | Admitting: Nurse Practitioner

## 2023-07-15 VITALS — BP 120/70 | HR 106 | Temp 98.0°F | Ht 65.0 in | Wt 197.0 lb

## 2023-07-15 DIAGNOSIS — M25561 Pain in right knee: Secondary | ICD-10-CM | POA: Diagnosis not present

## 2023-07-15 DIAGNOSIS — G8929 Other chronic pain: Secondary | ICD-10-CM

## 2023-07-15 DIAGNOSIS — Z79899 Other long term (current) drug therapy: Secondary | ICD-10-CM | POA: Diagnosis not present

## 2023-07-15 DIAGNOSIS — R296 Repeated falls: Secondary | ICD-10-CM | POA: Diagnosis not present

## 2023-07-15 NOTE — Progress Notes (Signed)
Assessment and Plan:  Angelica Weeks was seen today for an episodic visit.  Diagnoses and all order for this visit:  Chronic pain of right knee Continue with Right Knee MRI and new referral to [facility] RICE when flared Brace support  Multiple falls Contact guard when changing positions Move slowly when moving  Medication management All medications discussed and reviewed in full. All questions and concerns regarding medications addressed.    Orders Placed This Encounter  Procedures   Ambulatory referral to Pain Clinic    Referral Priority:   Routine    Referral Type:   Consultation    Referral Reason:   Specialty Services Required    Requested Specialty:   Pain Medicine    Number of Visits Requested:   1   Notify office for further evaluation and treatment, questions or concerns if s/s fail to improve. The risks and benefits of my recommendations, as well as other treatment options were discussed with the patient today. Questions were answered.  Further disposition pending results of labs. Discussed med's effects and SE's.    Over 15 minutes of exam, counseling, chart review, and critical decision making was performed.   Future Appointments  Date Time Provider Department Center  01/25/2024  3:00 PM Jalyn Dutta, Archie Patten, NP [facility] None    ------------------------------------------------------------------------------------------------------------------   HPI BP 120/70   Pulse (!) 106   Temp 98 F (36.7 C)   Ht 5\' 5"  (1.651 m)   Wt 197 lb (89.4 kg)   LMP  (LMP Unknown) Comment: "I've not had a period in 3-4 years"  SpO2 98%   BMI 32.78 kg/m   52 y.o.female presents for evaluation of right knee pain.  States that she is continuing to have pain in the right knee.  Not sure if it is caused by multiple falls.  She has trouble walking.  Noted swelling intermittently.  Has had x-ray completed 01/2023 that was negative.  However, continues to have pain.   She has taken several OTC medications and prescription medications including Gabapentin, Meloxicam, Cyclobenzaprine without effectiveness.  Reports steroid injections are not effective.  Stretching and exercise exacerbate pain. She was seen at [facility] pain management but felt as though they could not help her manage the pain in her knee as they only focused on her low back pain.   Past Medical History:  Diagnosis Date   ADD (attention deficit disorder)    Anxiety    Bipolar 1 disorder, depressed (HCC)    Cannabis abuse with psychotic disorder with delusions (HCC) 05/22/2018   Cellulitis of left eyelid 01/2014   Depression    Dysmenorrhea    Paranoid delusion (HCC) 05/22/2018   Schizophrenia (HCC)    Vitamin D deficiency      Allergies  Allergen Reactions   Aspirin Other (See Comments)    Burns stomach    Current Outpatient Medications on File Prior to Visit  Medication Sig   ALPRAZolam (XANAX) 1 MG tablet Take 1 mg by mouth at bedtime as needed for anxiety.   amphetamine-dextroamphetamine (ADDERALL) 20 MG tablet Take 1 tablet (20 mg total) by mouth 3 (three) times daily.   atomoxetine (STRATTERA) 25 MG capsule Take 2 capsules (50 mg total) by mouth daily.   Cyanocobalamin 5000 MCG SUBL Place 1 tablet (5,000 mcg total) under the tongue once a week.   glycopyrrolate (ROBINUL) 1 MG tablet TAKE 1 TABLET BY MOUTH 3 TIMES DAILY.   ibuprofen (ADVIL) 800 MG tablet TAKE  1 TABLET BY MOUTH EVERY 8 HOURS AS NEEDED   lamoTRIgine (LAMICTAL) 200 MG tablet Take 2 tablets (400 mg total) by mouth at bedtime.   Magnesium 500 MG CAPS Take by mouth.   meloxicam (MOBIC) 15 MG tablet TAKE 1 TABLET BY MOUTH EVERY DAY   metFORMIN (GLUCOPHAGE-XR) 500 MG 24 hr tablet Take 1 tab daily with largest meal of the day for 4 weeks; if tolerating well increase to twice daily with meals for insulin resistance.   methocarbamol (ROBAXIN) 750 MG tablet Take 1 tablet (750 mg total) by mouth 2 (two) times daily as  needed for muscle spasms.   ondansetron (ZOFRAN) 4 MG tablet TAKE 1 TABLET BY MOUTH 3 TIMES A DAY IF NEEDED FOR NAUSEA   oxybutynin (DITROPAN) 5 MG tablet TAKE 1 TABLET BY MOUTH TWICE A DAY   traZODone (DESYREL) 100 MG tablet Take 100 mg by mouth. Takes 200 mg at bedtime.   VITAMIN D PO Take 250 mcg by mouth daily.   VRAYLAR 6 MG CAPS TAKE 1 CAPSULE BY MOUTH DAILY   No current facility-administered medications on file prior to visit.    ROS: all negative except what is noted in the HPI.   Physical Exam:  BP 120/70   Pulse (!) 106   Temp 98 F (36.7 C)   Ht 5\' 5"  (1.651 m)   Wt 197 lb (89.4 kg)   LMP  (LMP Unknown) Comment: "I've not had a period in 3-4 years"  SpO2 98%   BMI 32.78 kg/m   General Appearance: NAD.  Awake, conversant and cooperative. Eyes: PERRLA, EOMs intact.  Sclera white.  Conjunctiva without erythema. Sinuses: No frontal/maxillary tenderness.  No nasal discharge. Nares patent.  ENT/Mouth: Ext aud canals clear.  Bilateral TMs w/DOL and without erythema or bulging. Hearing intact.  Posterior pharynx without swelling or exudate.  Tonsils without swelling or erythema.  Neck: Supple.  No masses, nodules or thyromegaly. Respiratory: Effort is regular with non-labored breathing. Breath sounds are equal bilaterally without rales, rhonchi, wheezing or stridor.  Cardio: RRR with no MRGs. Brisk peripheral pulses without edema.  Abdomen: Active BS in all four quadrants.  Soft and non-tender without guarding, rebound tenderness, hernias or masses. Lymphatics: Non tender without lymphadenopathy.  Musculoskeletal: LROM right knee, 4/5 strength, favors right knee upon ambulation.  Tender to palpation along patellar region. No clubbing or cyanosis. Skin: Appropriate color for ethnicity. Warm without rashes, lesions, ecchymosis, ulcers.  Neuro: CN II-XII grossly normal. Normal muscle tone without cerebellar symptoms and intact sensation.   Psych: AO X 3,  appropriate mood and  affect, insight and judgment.     [name], NP 2:57 PM [facility]

## 2023-07-15 NOTE — Patient Instructions (Signed)
Chronic Knee Pain, Adult Knee pain that lasts longer than 3 months is called chronic knee pain. You may have pain in one or both knees. Symptoms of chronic knee pain may also include swelling and stiffness. The most common cause is age-related wear and tear (osteoarthritis) of your knee joint. Many conditions can cause chronic knee pain. Treatment depends on the cause. The main treatments are physical therapy and weight loss. It may also be treated with medicines, injections, a knee sleeve or brace, and by using crutches. Rest, ice, pressure (compression), and elevation, also known as RICE therapy, may also be recommended. Follow these instructions at home: If you have a knee sleeve or brace:  Wear the knee sleeve or brace as told by your doctor. Take it off only as told by your doctor. Loosen it if your toes: Tingle. Become numb. Turn cold and blue. Keep it clean. If the sleeve or brace is not waterproof: Do not let it get wet. Ask your doctor if you may take it off when you take a bath or shower. If not, cover it with a watertight covering. Managing pain, stiffness, and swelling     If told, put heat on your knee. Do this as often as told by your doctor. Use the heat source that your doctor recommends, such as a moist heat pack or a heating pad. If you have a removable knee sleeve or brace, take it off as told by your doctor. Place a towel between your skin and the heat source. Leave the heat on for 20-30 minutes. Take off the heat if your skin turns bright red. This is very important. If you cannot feel pain, heat, or cold, you have a greater risk of getting burned. If told, put ice on your knee. To do this: If you have a removable knee sleeve or brace, take it off as told by your doctor. Put ice in a plastic bag. Place a towel between your skin and the bag. Leave the ice on for 20 minutes, 2-3 times a day. Take off the ice if your skin turns bright red. This is very important. If you  cannot feel pain, heat, or cold, you have a greater risk of damage to the area. Move your toes often. Raise the injured area above the level of your heart while you are sitting or lying down. Activity Avoid activities where both feet leave the ground at the same time (high-impact activities). Examples are running, jumping rope, and doing jumping jacks. Follow the exercise plan that your doctor makes for you. Your doctor may suggest that you: Avoid activities that make knee pain worse. You may need to change the exercises that you do, the sports that you participate in, or your job duties. Wear shoes with cushioned soles. Avoid sports that require running and sudden changes in direction. Do exercises or physical therapy. This is planned to match your needs and your abilities. Do exercises that increase your balance and strength, such as tai chi and yoga. Do not use your injured knee to support your body weight until your doctor says that you can. Use crutches as told by your doctor. Return to your normal activities when your doctor says that it is safe. General instructions Take over-the-counter and prescription medicines only as told by your doctor. If you are overweight, work with your doctor and a food expert (dietitian) to set goals to lose weight. Being overweight can make your knee hurt more. Do not smoke or use any  products that contain nicotine or tobacco. If you need help quitting, ask your doctor. Keep all follow-up visits. Contact a doctor if: You have knee pain that is not getting better or gets worse. You are not able to do your exercises due to knee pain. Get help right away if: Your knee swells and the swelling gets worse. You cannot move your knee. You have very bad knee pain. Summary Knee pain that lasts more than 3 months is called chronic knee pain. The main treatments for chronic knee pain are physical therapy and weight loss. You may also need to take medicines, wear a  knee sleeve or brace, use crutches, and put ice or heat on your knee. Lose weight if you are overweight. Work with your doctor and a food expert (dietitian) to help you set goals to lose weight. Being overweight can make your knee hurt more. Follow the exercise plan that your doctor makes for you. This information is not intended to replace advice given to you by your health care provider. Make sure you discuss any questions you have with your health care provider. Document Revised: 05/15/2020 Document Reviewed: 05/16/2020 Elsevier Patient Education  2024 [employer].

## 2023-07-24 DIAGNOSIS — M25561 Pain in right knee: Secondary | ICD-10-CM | POA: Diagnosis not present

## 2023-07-27 DIAGNOSIS — M545 Low back pain, unspecified: Secondary | ICD-10-CM | POA: Diagnosis not present

## 2023-07-30 DIAGNOSIS — F331 Major depressive disorder, recurrent, moderate: Secondary | ICD-10-CM | POA: Diagnosis not present

## 2023-07-30 DIAGNOSIS — Z79899 Other long term (current) drug therapy: Secondary | ICD-10-CM | POA: Diagnosis not present

## 2023-07-30 DIAGNOSIS — F419 Anxiety disorder, unspecified: Secondary | ICD-10-CM | POA: Diagnosis not present

## 2023-07-30 DIAGNOSIS — F902 Attention-deficit hyperactivity disorder, combined type: Secondary | ICD-10-CM | POA: Diagnosis not present

## 2023-08-05 DIAGNOSIS — M7121 Synovial cyst of popliteal space [Baker], right knee: Secondary | ICD-10-CM | POA: Diagnosis not present

## 2023-08-05 DIAGNOSIS — M67961 Unspecified disorder of synovium and tendon, right lower leg: Secondary | ICD-10-CM | POA: Diagnosis not present

## 2023-08-05 DIAGNOSIS — S83241A Other tear of medial meniscus, current injury, right knee, initial encounter: Secondary | ICD-10-CM | POA: Diagnosis not present

## 2023-08-06 DIAGNOSIS — F900 Attention-deficit hyperactivity disorder, predominantly inattentive type: Secondary | ICD-10-CM | POA: Diagnosis not present

## 2023-08-06 DIAGNOSIS — F411 Generalized anxiety disorder: Secondary | ICD-10-CM | POA: Diagnosis not present

## 2023-08-06 DIAGNOSIS — F3132 Bipolar disorder, current episode depressed, moderate: Secondary | ICD-10-CM | POA: Diagnosis not present

## 2023-08-16 DIAGNOSIS — S83206A Unspecified tear of unspecified meniscus, current injury, right knee, initial encounter: Secondary | ICD-10-CM | POA: Diagnosis not present

## 2023-08-16 DIAGNOSIS — Z79899 Other long term (current) drug therapy: Secondary | ICD-10-CM | POA: Diagnosis not present

## 2023-08-16 DIAGNOSIS — M545 Low back pain, unspecified: Secondary | ICD-10-CM | POA: Diagnosis not present

## 2023-08-16 DIAGNOSIS — G8929 Other chronic pain: Secondary | ICD-10-CM | POA: Diagnosis not present

## 2023-08-16 DIAGNOSIS — M5136 Other intervertebral disc degeneration, lumbar region: Secondary | ICD-10-CM | POA: Diagnosis not present

## 2023-08-21 DIAGNOSIS — Z79899 Other long term (current) drug therapy: Secondary | ICD-10-CM | POA: Diagnosis not present

## 2023-09-02 ENCOUNTER — Ambulatory Visit (INDEPENDENT_AMBULATORY_CARE_PROVIDER_SITE_OTHER): Payer: BC Managed Care – PPO | Admitting: Physician Assistant

## 2023-09-02 ENCOUNTER — Encounter (INDEPENDENT_AMBULATORY_CARE_PROVIDER_SITE_OTHER): Payer: Self-pay | Admitting: Physician Assistant

## 2023-09-02 VITALS — BP 104/68 | HR 99 | Temp 98.6°F | Ht 65.0 in | Wt 194.0 lb

## 2023-09-02 DIAGNOSIS — E559 Vitamin D deficiency, unspecified: Secondary | ICD-10-CM | POA: Diagnosis not present

## 2023-09-02 DIAGNOSIS — E538 Deficiency of other specified B group vitamins: Secondary | ICD-10-CM | POA: Diagnosis not present

## 2023-09-02 DIAGNOSIS — Z6832 Body mass index (BMI) 32.0-32.9, adult: Secondary | ICD-10-CM

## 2023-09-02 DIAGNOSIS — E88819 Insulin resistance, unspecified: Secondary | ICD-10-CM | POA: Diagnosis not present

## 2023-09-02 DIAGNOSIS — E669 Obesity, unspecified: Secondary | ICD-10-CM | POA: Diagnosis not present

## 2023-09-02 NOTE — Progress Notes (Addendum)
.smr  Office: [phone]  /  Fax: [phone]  WEIGHT SUMMARY AND BIOMETRICS  Vitals Temp: 98.6 F (37 C) BP: 104/68 Pulse Rate: 99 SpO2: 97 %   Anthropometric Measurements Height: 5\' 5"  (1.651 m) Weight: 194 lb (88 kg) BMI (Calculated): 32.28 Weight at Last Visit: 190 lb Weight Lost Since Last Visit: 0 Weight Gained Since Last Visit: 4 lb Starting Weight: 222 lb Total Weight Loss (lbs): 28 lb (12.7 kg) Peak Weight: 223 lb   Body Composition  Body Fat %: 44.4 % Fat Mass (lbs): 86.4 lbs Muscle Mass (lbs): 102.6 lbs Total Body Water (lbs): 71.8 lbs Visceral Fat Rating : 11   Other Clinical Data Fasting: no Labs: no Today's Visit #: 11 Starting Date: 05/28/22     HPI  Chief Complaint: OBESITY  Angelica Weeks is here to discuss her progress with her obesity treatment plan. She is on the the Category 3 Plan and states she is following her eating plan approximately 30 % of the time. She states she is exercising 0 minutes 0 times per week.   Interval History: Last in office visit 05/12/23 with Angelica Hamburger, NP Since last office visit she is up 4 lbs. She has an injury to her knee and is seeing [facility] and may need surgery. She has follow up scheduled for next week.   The patient, a 52 year old female with a history of insulin resistance, vitamin D deficiency, vitamin B12 deficiency, and obstructive sleep apnea/central mediated apnea, presents for a follow-up visit regarding her obesity treatment plan. She reports struggling with adherence to her diet and exercise plan, particularly with consuming enough protein due to her dislike for meat. She also has a preference for sweets, which she acknowledges as a challenge in managing her weight. The patient works part-time at [employer], but she does not find this to be a trigger for overeating. She lives with her partner and two adult children. Recently, the patient sustained a meniscus tear, which has limited her ability  to exercise. She is scheduled to see an orthopedic specialist for further evaluation and potential treatment.  Non fasting labs obtained today.  She was informed we would discuss her lab results at her next visit unless there is a critical issue that needs to be addressed sooner. She agreed to keep her next visit at the agreed upon time to discuss these results.   Pharmacotherapy: metformin for insulin resistance per PCP.   TREATMENT PLAN FOR OBESITY: Obesity Struggles with protein intake due to disinterest in meat. Difficulty with sweets. -Resume category 3 plan, focusing on protein intake. -Consider high protein foods such as beans, yogurt, and drinkable yogurt. -Consider protein substitutions for meat, such as Fiber One bars. -Consider biscotti as a lower sugar alternative to cookies. -Drink 8 glasses of water daily. Recommended Dietary Goals  Angelica Weeks is currently in the action stage of change. As such, her goal is to continue weight management plan. She has agreed to the Category 3 Plan.  Behavioral Intervention  We discussed the following Behavioral Modification Strategies today: increasing lean protein intake, decreasing simple carbohydrates , increasing vegetables, increasing lower glycemic fruits, avoiding skipping meals, increasing water intake, emotional eating strategies and understanding the difference between hunger signals and cravings, work on managing stress, creating time for self-care and relaxation measures, continue to practice mindfulness when eating, planning for success, and better snacking choices.  Additional resources provided today: NA  Recommended Physical Activity Goals  Angelica Weeks has been advised to work up to 150  minutes of moderate intensity aerobic activity a week and strengthening exercises 2-3 times per week for cardiovascular health, weight loss maintenance and preservation of muscle mass.   She has agreed to Unable to participate in physical activity at  present due to medical conditions  and will reassess once knee condition improved/resolved.    Pharmacotherapy We discussed various medication options to help Angelica Weeks with her weight loss efforts and we both agreed to continue metformin for insulin resistance and continue to work on nutritional and behavioral strategies to promote weight loss.      Return in about 4 weeks (around 09/30/2023).Marland Kitchen She was informed of the importance of frequent follow up visits to maximize her success with intensive lifestyle modifications for her multiple health conditions.  PHYSICAL EXAM:  Blood pressure 104/68, pulse 99, temperature 98.6 F (37 C), height 5\' 5"  (1.651 m), weight 194 lb (88 kg), SpO2 97%. Body mass index is 32.28 kg/m.  General: She is overweight, cooperative, alert, well developed, and in no acute distress. PSYCH: Has normal mood, affect and thought process.   Cardiovascular: HR 90's BP 104/68 Lungs: Normal breathing effort, no conversational dyspnea. Neuro: no focal deficits  DIAGNOSTIC DATA REVIEWED:  BMET    Component Value Date/Time   NA 138 01/22/2023 1605   NA 134 07/01/2022 1533   K 4.2 01/22/2023 1605   CL 101 01/22/2023 1605   CO2 28 01/22/2023 1605   GLUCOSE 88 01/22/2023 1605   BUN 20 01/22/2023 1605   BUN 9 07/01/2022 1533   CREATININE 0.88 01/22/2023 1605   CALCIUM 9.9 01/22/2023 1605   GFRNONAA 73 01/22/2021 0951   GFRAA 85 01/22/2021 0951   Lab Results  Component Value Date   HGBA1C 5.3 01/22/2023   HGBA1C 5.6 02/13/2015   Lab Results  Component Value Date   INSULIN 9.2 07/01/2022   Lab Results  Component Value Date   TSH 1.18 01/22/2023   CBC    Component Value Date/Time   WBC 8.3 01/22/2023 1605   RBC 4.02 01/22/2023 1605   HGB 13.0 01/22/2023 1605   HGB 12.8 07/01/2022 1533   HCT 37.7 01/22/2023 1605   HCT 36.4 07/01/2022 1533   PLT 303 01/22/2023 1605   PLT 334 07/01/2022 1533   MCV 93.8 01/22/2023 1605   MCV 91 07/01/2022 1533   MCH  32.3 01/22/2023 1605   MCHC 34.5 01/22/2023 1605   RDW 12.6 01/22/2023 1605   RDW 14.1 07/01/2022 1533   Iron Studies    Component Value Date/Time   IRON 92 01/22/2021 0951   TIBC 391 01/22/2021 0951   IRONPCTSAT 24 01/22/2021 0951   Lipid Panel     Component Value Date/Time   CHOL 144 01/22/2023 1605   CHOL 169 07/01/2022 1533   TRIG 93 01/22/2023 1605   HDL 65 01/22/2023 1605   HDL 68 07/01/2022 1533   CHOLHDL 2.2 01/22/2023 1605   VLDL 14 04/13/2017 1117   LDLCALC 61 01/22/2023 1605   Hepatic Function Panel     Component Value Date/Time   PROT 6.7 01/22/2023 1605   PROT 6.6 07/01/2022 1533   ALBUMIN 4.4 07/01/2022 1533   AST 14 01/22/2023 1605   ALT 12 01/22/2023 1605   ALKPHOS 120 07/01/2022 1533   BILITOT 0.2 01/22/2023 1605   BILITOT 0.3 07/01/2022 1533   BILIDIR 0.1 04/13/2017 1117   IBILI 0.1 (L) 04/13/2017 1117      Component Value Date/Time   TSH 1.18 01/22/2023 1605   Nutritional  Lab Results  Component Value Date   VD25OH 114.0 (H) 04/13/2023   VD25OH 122 (H) 01/22/2023   VD25OH 81.0 07/01/2022    ASSOCIATED CONDITIONS ADDRESSED TODAY  ASSESSMENT AND PLAN  Problem List Items Addressed This Visit     Insulin resistance - Primary   Relevant Orders   Hemoglobin A1c   CMP14+EGFR   Vitamin D deficiency   Relevant Orders   VITAMIN D 25 Hydroxy (Vit-D Deficiency, Fractures)   B12 deficiency   Relevant Orders   Vitamin B12   OSA (obstructive sleep apnea)   Other Visit Diagnoses     Obesity, Beginning BMI 36.94       BMI 32.0-32.9,adult Current BMI 32.3         Prediabetes Last A1c was 5.3/ Insulin 9.2  Medication(s): Metformin 500 mg twice daily with meals Polyphagia:No She is getting back on tract with her nutrition plan to promote weight loss, improve insulin resistance and prevent progression to Type 2 diabetes.  Lab Results  Component Value Date   HGBA1C 5.3 01/22/2023   HGBA1C 5.0 07/01/2022   HGBA1C 5.0 01/22/2022   HGBA1C  5.0 01/22/2021   HGBA1C 4.6 10/10/2019   Lab Results  Component Value Date   INSULIN 9.2 07/01/2022    Plan: Continue Metformin 500 mg twice daily with meals per PCP.  Getting back on track and working on nutrition plan to decrease simple carbohydrates, increase lean proteins and exercise to promote weight loss, improve glycemic control and prevent progression to Type 2 diabetes.  Recheck A1c, CMET today.     History of Vitamin D Deficiency Previously high levels, stopped all supplementation at that time. Endorses fatigue.  Low vitamin D levels can be associated with adiposity and may result in leptin resistance and weight gain. Also associated with fatigue. Currently on vitamin D supplementation without any adverse effects.  -ReCheck Vitamin D levels today and supplement if indicated.  Vitamin B12 Deficiency Currently supplementing B 12 5000 units SL weekly. Endorses fatigue. No recent level -ReCheck Vitamin B12 levels today and adjust supplement accordingly.     Meniscus Tear Limits ability to exercise. -Discuss chair level exercises to maintain upper body strength and conditioning.  Follow-up in 1 month on September 28, 2023 at 215pm.  ATTESTASTION STATEMENTS:  Reviewed by clinician on day of visit: allergies, medications, problem list, medical history, surgical history, family history, social history, and previous encounter notes.   I have personally spent 30 minutes total time today in preparation, patient care, nutritional counseling and documentation for this visit, including the following: review of clinical lab tests; review of medical tests/procedures/services.      Novi Calia, PA-C

## 2023-09-03 LAB — CMP14+EGFR
ALT: 14 IU/L (ref 0–32)
AST: 15 IU/L (ref 0–40)
Albumin: 4.6 g/dL (ref 3.9–4.9)
Alkaline Phosphatase: 99 IU/L (ref 44–121)
BUN/Creatinine Ratio: 17 (ref 9–23)
BUN: 14 mg/dL (ref 6–24)
Bilirubin Total: <0.2 mg/dL (ref 0.0–1.2)
CO2: 25 mmol/L (ref 20–29)
Calcium: 9.8 mg/dL (ref 8.7–10.2)
Chloride: 102 mmol/L (ref 96–106)
Creatinine, Ser: 0.81 mg/dL (ref 0.57–1.00)
Globulin, Total: 2.2 g/dL (ref 1.5–4.5)
Glucose: 93 mg/dL (ref 70–99)
Potassium: 4.4 mmol/L (ref 3.5–5.2)
Sodium: 142 mmol/L (ref 134–144)
Total Protein: 6.8 g/dL (ref 6.0–8.5)
eGFR: 88 mL/min/{1.73_m2} (ref >59)

## 2023-09-03 LAB — HEMOGLOBIN A1C
Est. average glucose Bld gHb Est-mCnc: 100 mg/dL
Hgb A1c MFr Bld: 5.1 % (ref 4.8–5.6)

## 2023-09-03 LAB — VITAMIN B12: Vitamin B-12: >2000 pg/mL — ABNORMAL HIGH (ref 232–1245)

## 2023-09-03 LAB — VITAMIN D 25 HYDROXY (VIT D DEFICIENCY, FRACTURES): Vit D, 25-Hydroxy: 57.1 ng/mL (ref 30.0–100.0)

## 2023-09-10 DIAGNOSIS — M25561 Pain in right knee: Secondary | ICD-10-CM | POA: Diagnosis not present

## 2023-09-22 ENCOUNTER — Ambulatory Visit: Payer: BC Managed Care – PPO | Admitting: Nurse Practitioner

## 2023-09-22 ENCOUNTER — Encounter: Payer: Self-pay | Admitting: Nurse Practitioner

## 2023-09-22 VITALS — BP 110/74 | HR 91 | Temp 97.9°F | Ht 65.0 in | Wt 198.2 lb

## 2023-09-22 DIAGNOSIS — H60393 Other infective otitis externa, bilateral: Secondary | ICD-10-CM | POA: Diagnosis not present

## 2023-09-22 MED ORDER — CIPROFLOXACIN-DEXAMETHASONE 0.3-0.1 % OT SUSP
4.0000 [drp] | Freq: Two times a day (BID) | OTIC | 0 refills | Status: DC
Start: 2023-09-22 — End: 2023-09-30

## 2023-09-22 NOTE — Progress Notes (Unsigned)
Assessment and Plan:  Prosperity Darrough Halliwell was seen today for an episodic visit.  Diagnoses and all order for this visit:  Other infective acute otitis externa of both ears Start tmt with Ciprodex Keep ears free from moisture Refrain from using Q-tips. If s/s fail to improve contact office  - ciprofloxacin-dexamethasone (CIPRODEX) OTIC suspension; Place 4 drops into both ears 2 (two) times daily. 7 days  Dispense: 7.5 mL; Refill: 0  Notify office for further evaluation and treatment, questions or concerns if s/s fail to improve. The risks and benefits of my recommendations, as well as other treatment options were discussed with the patient today. Questions were answered.  Further disposition pending results of labs. Discussed med's effects and SE's.    Over 15 minutes of exam, counseling, chart review, and critical decision making was performed.   Future Appointments  Date Time Provider Department Center  09/28/2023  2:15 PM Rayburn, Fanny Bien, PA-C [facility] None  01/25/2024  3:00 PM Lennon Boutwell, Archie Patten, NP [facility] None    ------------------------------------------------------------------------------------------------------------------   HPI BP 110/74   Pulse 91   Temp 97.9 F (36.6 C)   Ht 5\' 5"  (1.651 m)   Wt 198 lb 3.2 oz (89.9 kg)   LMP  (LMP Unknown) Comment: "I've not had a period in 3-4 years"  SpO2 98%   BMI 32.98 kg/m   Patient presents with drainage of pus, pressure, decreased hearing.  Symptoms have been present for 2 weeks.  During that time there has been pus draining out of the left ear. Previous treatment: none. Denies fever, chills, N/V, URI.  Past Medical History:  Diagnosis Date   ADD (attention deficit disorder)    Anxiety    Bipolar 1 disorder, depressed (HCC)    Cannabis abuse with psychotic disorder with delusions (HCC) 05/22/2018   Cellulitis of left eyelid 01/2014   Depression    Dysmenorrhea    Paranoid delusion (HCC) 05/22/2018    Schizophrenia (HCC)    Vitamin D deficiency      Allergies  Allergen Reactions   Aspirin Other (See Comments)    Burns stomach    Current Outpatient Medications on File Prior to Visit  Medication Sig   ALPRAZolam (XANAX) 1 MG tablet Take 1 mg by mouth at bedtime as needed for anxiety.   amphetamine-dextroamphetamine (ADDERALL) 20 MG tablet Take 1 tablet (20 mg total) by mouth 3 (three) times daily.   atomoxetine (STRATTERA) 25 MG capsule Take 2 capsules (50 mg total) by mouth daily.   Cyanocobalamin 5000 MCG SUBL Place 1 tablet (5,000 mcg total) under the tongue once a week.   glycopyrrolate (ROBINUL) 1 MG tablet TAKE 1 TABLET BY MOUTH 3 TIMES DAILY.   ibuprofen (ADVIL) 800 MG tablet TAKE 1 TABLET BY MOUTH EVERY 8 HOURS AS NEEDED   lamoTRIgine (LAMICTAL) 200 MG tablet Take 2 tablets (400 mg total) by mouth at bedtime.   Magnesium 500 MG CAPS Take by mouth.   meloxicam (MOBIC) 15 MG tablet TAKE 1 TABLET BY MOUTH EVERY DAY   metFORMIN (GLUCOPHAGE-XR) 500 MG 24 hr tablet Take 1 tab daily with largest meal of the day for 4 weeks; if tolerating well increase to twice daily with meals for insulin resistance.   methocarbamol (ROBAXIN) 750 MG tablet Take 1 tablet (750 mg total) by mouth 2 (two) times daily as needed for muscle spasms.   ondansetron (ZOFRAN) 4 MG tablet TAKE 1 TABLET BY MOUTH 3 TIMES A DAY IF NEEDED FOR NAUSEA   oxybutynin (  DITROPAN) 5 MG tablet TAKE 1 TABLET BY MOUTH TWICE A DAY   traZODone (DESYREL) 100 MG tablet Take 100 mg by mouth. Takes 200 mg at bedtime.   VITAMIN D PO Take 250 mcg by mouth daily.   VRAYLAR 6 MG CAPS TAKE 1 CAPSULE BY MOUTH DAILY   No current facility-administered medications on file prior to visit.    ROS: all negative except what is noted in the HPI.   Physical Exam:  BP 110/74   Pulse 91   Temp 97.9 F (36.6 C)   Ht 5\' 5"  (1.651 m)   Wt 198 lb 3.2 oz (89.9 kg)   LMP  (LMP Unknown) Comment: "I've not had a period in 3-4 years"  SpO2 98%    BMI 32.98 kg/m   General Appearance: NAD.  Awake, conversant and cooperative. Eyes: PERRLA, EOMs intact.  Sclera white.  Conjunctiva without erythema. Sinuses: No frontal/maxillary tenderness.  No nasal discharge. Nares patent.  ENT/Mouth: Ext aud canals clear.  Bilateral TMs w/DOL and with erythema and pustule type drainage.  Hearing intact.  Posterior pharynx without swelling or exudate.  Tonsils without swelling or erythema.  Neck: Supple.  No masses, nodules or thyromegaly. Respiratory: Effort is regular with non-labored breathing. Breath sounds are equal bilaterally without rales, rhonchi, wheezing or stridor.  Cardio: RRR with no MRGs. Brisk peripheral pulses without edema.  Abdomen: Active BS in all four quadrants.  Soft and non-tender without guarding, rebound tenderness, hernias or masses. Lymphatics: Non tender without lymphadenopathy.  Musculoskeletal: Full ROM, 5/5 strength, normal ambulation.  No clubbing or cyanosis. Skin: Appropriate color for ethnicity. Warm without rashes, lesions, ecchymosis, ulcers.  Neuro: CN II-XII grossly normal. Normal muscle tone without cerebellar symptoms and intact sensation.   Psych: AO X 3,  appropriate mood and affect, insight and judgment.     [name], NP 10:52 PM [facility]

## 2023-09-23 ENCOUNTER — Encounter: Payer: Self-pay | Admitting: Nurse Practitioner

## 2023-09-23 NOTE — Patient Instructions (Signed)
Otitis Externa  Otitis externa is an infection of the outer ear canal. The outer ear canal is the area between the outside of the ear and the eardrum. Otitis externa is sometimes called swimmer's ear. What are the causes? Common causes of this condition include: Swimming in dirty water. Moisture in the ear. An injury to the inside of the ear. An object stuck in the ear. A cut or scrape on the outside of the ear or in the ear canal. What increases the risk? You are more likely to get this condition if you go swimming often. What are the signs or symptoms? Itching in the ear. This is often the first symptom. Swelling of the ear. Redness in the ear. Ear pain. The pain may get worse when you pull on your ear. Pus coming from the ear. How is this treated? This condition may be treated with: Antibiotic ear drops. These are often given for 10-14 days. Medicines to reduce itching and swelling. Follow these instructions at home: If you were prescribed antibiotic ear drops, use them as told by your doctor. Do not stop using them even if you start to feel better. Take over-the-counter and prescription medicines only as told by your doctor. Avoid getting water in your ears as told by your doctor. You may be told to avoid swimming or water sports for a few days. Keep all follow-up visits. How is this prevented? Keep your ears dry. Use the corner of a towel to dry your ears after you swim or bathe. Try not to scratch or put things in your ear. Doing these things makes it easier for germs to grow in your ear. Avoid swimming in lakes, dirty water, or swimming pools that may not have the right amount of a chemical called chlorine. Contact a doctor if: You have a fever. Your ear is still red, swollen, or painful after 3 days. You still have pus coming from your ear after 3 days. Your redness, swelling, or pain gets worse. You have a very bad headache. Get help right away if: You have redness,  swelling, and pain or tenderness behind your ear. Summary Otitis externa is an infection of the outer ear canal. Symptoms include pain, redness, and swelling of the ear. If you were prescribed antibiotic ear drops, use them as told by your doctor. Do not stop using them even if you start to feel better. Try not to scratch or put things in your ear. This information is not intended to replace advice given to you by your health care provider. Make sure you discuss any questions you have with your health care provider. Document Revised: 02/13/2021 Document Reviewed: 02/13/2021 Elsevier Patient Education  2024 [employer]  

## 2023-09-24 ENCOUNTER — Other Ambulatory Visit: Payer: Self-pay | Admitting: Nurse Practitioner

## 2023-09-25 ENCOUNTER — Other Ambulatory Visit: Payer: Self-pay | Admitting: Nurse Practitioner

## 2023-09-25 DIAGNOSIS — M51369 Other intervertebral disc degeneration, lumbar region without mention of lumbar back pain or lower extremity pain: Secondary | ICD-10-CM | POA: Diagnosis not present

## 2023-09-25 DIAGNOSIS — E6609 Other obesity due to excess calories: Secondary | ICD-10-CM | POA: Diagnosis not present

## 2023-09-25 DIAGNOSIS — E559 Vitamin D deficiency, unspecified: Secondary | ICD-10-CM | POA: Diagnosis not present

## 2023-09-25 DIAGNOSIS — S83206A Unspecified tear of unspecified meniscus, current injury, right knee, initial encounter: Secondary | ICD-10-CM | POA: Diagnosis not present

## 2023-09-25 DIAGNOSIS — Z79899 Other long term (current) drug therapy: Secondary | ICD-10-CM | POA: Diagnosis not present

## 2023-09-25 DIAGNOSIS — M129 Arthropathy, unspecified: Secondary | ICD-10-CM | POA: Diagnosis not present

## 2023-09-25 DIAGNOSIS — G8929 Other chronic pain: Secondary | ICD-10-CM | POA: Diagnosis not present

## 2023-09-25 DIAGNOSIS — M545 Low back pain, unspecified: Secondary | ICD-10-CM | POA: Diagnosis not present

## 2023-09-25 DIAGNOSIS — M25561 Pain in right knee: Secondary | ICD-10-CM

## 2023-09-25 DIAGNOSIS — Z6832 Body mass index (BMI) 32.0-32.9, adult: Secondary | ICD-10-CM | POA: Diagnosis not present

## 2023-09-27 ENCOUNTER — Other Ambulatory Visit: Payer: Self-pay | Admitting: Nurse Practitioner

## 2023-09-27 MED ORDER — CLOTRIMAZOLE-BETAMETHASONE 1-0.05 % EX CREA: 1.0000 | TOPICAL_CREAM | Freq: Two times a day (BID) | CUTANEOUS | 2 refills | Status: DC

## 2023-09-28 ENCOUNTER — Ambulatory Visit (INDEPENDENT_AMBULATORY_CARE_PROVIDER_SITE_OTHER): Payer: BC Managed Care – PPO | Admitting: Physician Assistant

## 2023-09-29 DIAGNOSIS — Z79899 Other long term (current) drug therapy: Secondary | ICD-10-CM | POA: Diagnosis not present

## 2023-09-30 ENCOUNTER — Other Ambulatory Visit: Payer: Self-pay | Admitting: Nurse Practitioner

## 2023-09-30 DIAGNOSIS — H60393 Other infective otitis externa, bilateral: Secondary | ICD-10-CM

## 2023-10-02 DIAGNOSIS — Z79899 Other long term (current) drug therapy: Secondary | ICD-10-CM | POA: Diagnosis not present

## 2023-10-02 DIAGNOSIS — M51369 Other intervertebral disc degeneration, lumbar region without mention of lumbar back pain or lower extremity pain: Secondary | ICD-10-CM | POA: Diagnosis not present

## 2023-10-02 DIAGNOSIS — S83206A Unspecified tear of unspecified meniscus, current injury, right knee, initial encounter: Secondary | ICD-10-CM | POA: Diagnosis not present

## 2023-10-02 DIAGNOSIS — M545 Low back pain, unspecified: Secondary | ICD-10-CM | POA: Diagnosis not present

## 2023-10-02 DIAGNOSIS — E6609 Other obesity due to excess calories: Secondary | ICD-10-CM | POA: Diagnosis not present

## 2023-10-02 DIAGNOSIS — M25561 Pain in right knee: Secondary | ICD-10-CM | POA: Diagnosis not present

## 2023-10-02 DIAGNOSIS — Z6832 Body mass index (BMI) 32.0-32.9, adult: Secondary | ICD-10-CM | POA: Diagnosis not present

## 2023-10-06 MED ORDER — NEOMYCIN-POLYMYXIN-HC 3.5-10000-1 OT SOLN
4.0000 [drp] | Freq: Four times a day (QID) | OTIC | 0 refills | Status: DC
Start: 2023-10-06 — End: 2023-11-09

## 2023-10-08 DIAGNOSIS — F3132 Bipolar disorder, current episode depressed, moderate: Secondary | ICD-10-CM | POA: Diagnosis not present

## 2023-10-08 DIAGNOSIS — F900 Attention-deficit hyperactivity disorder, predominantly inattentive type: Secondary | ICD-10-CM | POA: Diagnosis not present

## 2023-10-08 DIAGNOSIS — F411 Generalized anxiety disorder: Secondary | ICD-10-CM | POA: Diagnosis not present

## 2023-10-13 ENCOUNTER — Other Ambulatory Visit: Payer: Self-pay | Admitting: Nurse Practitioner

## 2023-10-13 DIAGNOSIS — M25561 Pain in right knee: Secondary | ICD-10-CM

## 2023-10-22 MED ORDER — AMOXICILLIN-POT CLAVULANATE 875-125 MG PO TABS: 1.0000 | ORAL_TABLET | Freq: Two times a day (BID) | ORAL | 0 refills | Status: DC

## 2023-10-28 ENCOUNTER — Other Ambulatory Visit: Payer: Self-pay | Admitting: Nurse Practitioner

## 2023-10-28 DIAGNOSIS — F419 Anxiety disorder, unspecified: Secondary | ICD-10-CM | POA: Diagnosis not present

## 2023-10-28 DIAGNOSIS — F331 Major depressive disorder, recurrent, moderate: Secondary | ICD-10-CM | POA: Diagnosis not present

## 2023-10-28 DIAGNOSIS — F902 Attention-deficit hyperactivity disorder, combined type: Secondary | ICD-10-CM | POA: Diagnosis not present

## 2023-10-28 DIAGNOSIS — H60393 Other infective otitis externa, bilateral: Secondary | ICD-10-CM

## 2023-10-28 DIAGNOSIS — Z79899 Other long term (current) drug therapy: Secondary | ICD-10-CM | POA: Diagnosis not present

## 2023-11-03 DIAGNOSIS — H6121 Impacted cerumen, right ear: Secondary | ICD-10-CM | POA: Diagnosis not present

## 2023-11-09 ENCOUNTER — Ambulatory Visit: Payer: BC Managed Care – PPO | Admitting: Internal Medicine

## 2023-11-09 ENCOUNTER — Encounter: Payer: Self-pay | Admitting: Internal Medicine

## 2023-11-09 VITALS — BP 102/79 | HR 101 | Temp 98.2°F | Ht 65.0 in | Wt 202.4 lb

## 2023-11-09 DIAGNOSIS — F17200 Nicotine dependence, unspecified, uncomplicated: Secondary | ICD-10-CM | POA: Diagnosis not present

## 2023-11-09 DIAGNOSIS — M1711 Unilateral primary osteoarthritis, right knee: Secondary | ICD-10-CM | POA: Insufficient documentation

## 2023-11-09 DIAGNOSIS — Z1211 Encounter for screening for malignant neoplasm of colon: Secondary | ICD-10-CM

## 2023-11-09 DIAGNOSIS — L700 Acne vulgaris: Secondary | ICD-10-CM | POA: Insufficient documentation

## 2023-11-09 DIAGNOSIS — R110 Nausea: Secondary | ICD-10-CM

## 2023-11-09 DIAGNOSIS — IMO0001 Reserved for inherently not codable concepts without codable children: Secondary | ICD-10-CM | POA: Insufficient documentation

## 2023-11-09 DIAGNOSIS — E88819 Insulin resistance, unspecified: Secondary | ICD-10-CM

## 2023-11-09 DIAGNOSIS — F331 Major depressive disorder, recurrent, moderate: Secondary | ICD-10-CM

## 2023-11-09 DIAGNOSIS — E669 Obesity, unspecified: Secondary | ICD-10-CM

## 2023-11-09 MED ORDER — VARENICLINE TARTRATE (STARTER) 0.5 MG X 11 & 1 MG X 42 PO TBPK: ORAL_TABLET | ORAL | 0 refills | Status: DC

## 2023-11-09 MED ORDER — VARENICLINE TARTRATE 1 MG PO TABS: 1.0000 mg | ORAL_TABLET | Freq: Two times a day (BID) | ORAL | 5 refills | Status: DC

## 2023-11-09 MED ORDER — RYBELSUS 3 MG PO TABS: 3.0000 mg | ORAL_TABLET | Freq: Every day | ORAL | 1 refills | Status: DC

## 2023-11-09 MED ORDER — TRETINOIN 0.1 % EX CREA: TOPICAL_CREAM | Freq: Every day | CUTANEOUS | 0 refills | Status: DC

## 2023-11-09 MED ORDER — ONDANSETRON HCL 4 MG PO TABS: ORAL_TABLET | ORAL | 5 refills | Status: DC

## 2023-11-09 MED ORDER — METFORMIN HCL ER 500 MG PO TB24: 1000.0000 mg | ORAL_TABLET | Freq: Two times a day (BID) | ORAL | 3 refills | Status: DC

## 2023-11-09 MED ORDER — CELECOXIB 200 MG PO CAPS: 200.0000 mg | ORAL_CAPSULE | Freq: Every day | ORAL | 3 refills | Status: DC

## 2023-11-09 NOTE — Addendum Note (Signed)
Addended by: Lula Olszewski on: 11/09/2023 08:47 PM   Modules accepted: Level of Service

## 2023-11-09 NOTE — Assessment & Plan Note (Signed)
Acne Vulgaris (L70.0) Assessment: Using tretinoin with good effect and appropriate sun protection. Plan:  Continued tretinoin 0.1% cream at bedtime Reinforced importance of sun protection Monitor for irritation with continued use

## 2023-11-09 NOTE — Assessment & Plan Note (Signed)
Obesity (E66.09), BMI 33.68 Assessment: Recent weight gain trend noted. Previously lost 30 pounds through structured program. Current metformin regimen ineffective for weight management. Has followed with healthy weight and wellness, finds them helpful, still would like assistance.  Plan: Trial of Rybelsus initiated pending insurance approval Continue metformin 500mg  but increase dose to 1000 twice daily if tolerated. Encouraged continuation with current weight management program Recommended low-carb diet approach Discussed potential for compounded GLP-1 medications if insurance denies coverage but she can't afford Insurance doesn't cover anything for her ... And Wellbutrin/phentermine won't work Adderall... maybe topiramate

## 2023-11-09 NOTE — Assessment & Plan Note (Signed)
Tobacco Use Disorder (Z72.0) Assessment: Long-term smoker expressing readiness to quit, requesting Chantix for smoking cessation. No contraindications with current psychiatric medications. Plan:  Initiated Varenicline (Chantix) starter pack: 0.5mg  daily x3 days, then 0.5mg  BID x4 days, then 1mg  BID Prescribed Varenicline continuation pack 1mg  BID Counseled that multiple quit attempts are often needed for success Discussed importance of continuing medication even if smoking restarts Emphasized cardiovascular benefits of smoking cessation, particularly given NSAID use

## 2023-11-09 NOTE — Assessment & Plan Note (Signed)
Osteoarthritis of Right Knee (M17.9) Assessment: Post-traumatic osteoarthritis following fall with ongoing ligamentous healing. Currently well-managed with Celebrex. Plan:  Continue Celebrex 200mg  daily Discontinued ibuprofen to avoid NSAID duplication Emphasized importance of adequate hydration with NSAID use Counseled on cardiovascular risks of NSAIDs, particularly with smoking Will reassess at follow-up

## 2023-11-09 NOTE — Patient Instructions (Signed)
AFTER VISIT SUMMARY  Today's Diagnoses: - Tobacco Use Disorder - Osteoarthritis of Right Knee - Obesity - Major Depressive Disorder - Bipolar Disorder - Acne Vulgaris  New Medications: 1. Varenicline (Chantix) Starter Pack    - Days 1-3: Take one 0.5mg  tablet once daily    - Days 4-7: Take one 0.5mg  tablet twice daily    - After Day 7: Take one 1mg  tablet twice daily    - Important: Continue taking even if you slip and smoke    - Watch for: Unusual mood changes, agitation, or sleep problems  2. Celebrex 200mg     - Take one tablet daily with a full glass of water    - Do NOT take with ibuprofen or naproxen (Aleve)    - Take with food to reduce stomach upset    - Important: Stay well hydrated while taking this medication  3. Tretinoin 0.1% Cream    - Apply a small amount at bedtime    - Must use sunscreen daily    - Avoid sun exposure when possible  4. Rybelsus (if approved by insurance)    - Take one 3mg  tablet daily    - Take on an empty stomach    - Wait 30 minutes before eating or taking other medications  Important Instructions: - Stop taking ibuprofen while using Celebrex - Drink plenty of water throughout the day - Continue current psychiatric medications as prescribed - Follow low-carb diet recommendations for weight management - Continue current weight management program  When to Call: - Severe stomach pain or black stools while taking Celebrex - Unusual mood changes or severe anxiety - Severe skin reaction to tretinoin - Severe nausea or vomiting with new medications  Follow-up Appointments: - Annual physical examination in February 2025 - Continue regular psychiatric follow-up as scheduled - Return to clinic sooner if new concerns arise  Call 911 or go to nearest emergency room if you experience: - Chest pain - Severe shortness of breath - Suicidal thoughts - Severe allergic reaction (difficulty breathing, severe rash, swelling)

## 2023-11-09 NOTE — Progress Notes (Signed)
[facility]: [phone]   -- Medical Office Visit --  Patient:  Angelica Weeks      Age: 52 y.o.       Sex:  female  Date:   11/09/2023 Today's Healthcare Provider: Lula Olszewski, MD  ==========================================================================     Assessment & Plan Smoking Tobacco Use Disorder (Z72.0) Assessment: Long-term smoker expressing readiness to quit, requesting Chantix for smoking cessation. No contraindications with current psychiatric medications. Plan:  Initiated Varenicline (Chantix) starter pack: 0.5mg  daily x3 days, then 0.5mg  BID x4 days, then 1mg  BID Prescribed Varenicline continuation pack 1mg  BID Counseled that multiple quit attempts are often needed for success Discussed importance of continuing medication even if smoking restarts Emphasized cardiovascular benefits of smoking cessation, particularly given NSAID use Screen for colon cancer  Acne vulgaris Acne Vulgaris (L70.0) Assessment: Using tretinoin with good effect and appropriate sun protection. Plan:  Continued tretinoin 0.1% cream at bedtime Reinforced importance of sun protection Monitor for irritation with continued use Moderate episode of recurrent major depressive disorder (HCC) Major Depressive Disorder, Recurrent, Moderate (F33.1) coexisting bipolar diagnosis with history psychosis Assessment: Well-controlled under psychiatric care with current medication regimen. Plan:  Continue current psychiatric medications as prescribed Maintain psychiatric follow-up No medication adjustments needed from primary care Osteoarthritis of right knee, unspecified osteoarthritis type Osteoarthritis of Right Knee (M17.9) Assessment: Post-traumatic osteoarthritis following fall with ongoing ligamentous healing. Currently well-managed with Celebrex. Plan:  Continue Celebrex 200mg  daily Discontinued ibuprofen to avoid NSAID duplication Emphasized importance of  adequate hydration with NSAID use Counseled on cardiovascular risks of NSAIDs, particularly with smoking Will reassess at follow-up Insulin resistance  Nausea  Obesity, Beginning BMI 36.94 Obesity (E66.09), BMI 33.68 Assessment: Recent weight gain trend noted. Previously lost 30 pounds through structured program. Current metformin regimen ineffective for weight management. Has followed with healthy weight and wellness, finds them helpful, still would like assistance.  Plan: Trial of Rybelsus initiated pending insurance approval Continue metformin 500mg  but increase dose to 1000 twice daily if tolerated. Encouraged continuation with current weight management program Recommended low-carb diet approach Discussed potential for compounded GLP-1 medications if insurance denies coverage but she can't afford Insurance doesn't cover anything for her ... And Wellbutrin/phentermine won't work Adderall... maybe topiramate     Orders Placed During this Encounter:         Ordered    Cologuard        11/09/23 1605    Varenicline Tartrate, Starter, (CHANTIX STARTING MONTH PAK) 0.5 MG X 11 & 1 MG X 42 TBPK        11/09/23 1605    varenicline (CHANTIX CONTINUING MONTH PAK) 1 MG tablet  2 times daily        11/09/23 1605    tretinoin (RETIN-A) 0.1 % cream  Daily at bedtime        11/09/23 1605    celecoxib (CELEBREX) 200 MG capsule  Daily        11/09/23 1605    ondansetron (ZOFRAN) 4 MG tablet        11/09/23 1605    metFORMIN (GLUCOPHAGE-XR) 500 MG 24 hr tablet  2 times daily with meals        11/09/23 1605    Semaglutide (RYBELSUS) 3 MG TABS  Daily        11/09/23 1605          Diagnoses and all orders for this visit: Smoking -     Varenicline Tartrate, Starter, (CHANTIX STARTING MONTH PAK) 0.5  MG X 11 & 1 MG X 42 TBPK; Take one 0.5 mg tablet by mouth once daily for 3 days, then increase to one 0.5 mg tablet twice daily for 4 days, then increase to one 1 mg tablet twice daily. -      varenicline (CHANTIX CONTINUING MONTH PAK) 1 MG tablet; Take 1 tablet (1 mg total) by mouth 2 (two) times daily. Screen for colon cancer -     Cologuard Acne vulgaris -     tretinoin (RETIN-A) 0.1 % cream; Apply topically at bedtime. Moderate episode of recurrent major depressive disorder (HCC) Osteoarthritis of right knee, unspecified osteoarthritis type -     celecoxib (CELEBREX) 200 MG capsule; Take 1 capsule (200 mg total) by mouth daily. Do not take with ibuprofen or aleve (naproxen).  Drink full glass of water with each dose Insulin resistance -     metFORMIN (GLUCOPHAGE-XR) 500 MG 24 hr tablet; Take 2 tablets (1,000 mg total) by mouth 2 (two) times daily with a meal. Take 1 tab daily with largest meal of the day for 4 weeks; if tolerating well increase to twice daily with meals for insulin resistance. Nausea -     ondansetron (ZOFRAN) 4 MG tablet; TAKE 1 TABLET BY MOUTH 3 TIMES A DAY IF NEEDED FOR NAUSEA Obesity, Beginning BMI 36.94 -     metFORMIN (GLUCOPHAGE-XR) 500 MG 24 hr tablet; Take 2 tablets (1,000 mg total) by mouth 2 (two) times daily with a meal. Take 1 tab daily with largest meal of the day for 4 weeks; if tolerating well increase to twice daily with meals for insulin resistance. -     Semaglutide (RYBELSUS) 3 MG TABS; Take 1 tablet (3 mg total) by mouth daily.  Recommended follow-up: Follow-up: Schedule physical examination in February 2025 when due, with fasting for labs Future Appointments  Date Time Provider Department Center  01/25/2024  8:40 AM Lula Olszewski, MD [facility]  01/25/2024  3:00 PM [name], NP [facility] None  Patient Care Team: Lula Olszewski, MD as PCP - General (Internal Medicine) Sallye Lat, MD as Consulting Physician (Optometry) Mitchel Honour, DO as Consulting Physician (Obstetrics and Gynecology) Andee Poles, MD as Consulting Physician (Psychiatry) Jerene Bears, MD as Consulting Physician (Obstetrics and  Gynecology)    SUBJECTIVE: 52 y.o. female who has ADD (attention deficit disorder); Bipolar 1 disorder, depressed (HCC); GERD (gastroesophageal reflux disease); Smoker unmotivated to quit; BMI 32.0-32.9,adult; Elevated prolactin level; Insulin resistance; Fatigue; Menopause; Metabolic syndrome; Moderate recurrent major depression (HCC); Sleep disturbance; Daytime somnolence; Elevated fasting blood sugar; Vitamin D deficiency; B12 deficiency; Hypersomnia, persistent; Fatigue due to depression; Insomnia due to other mental disorder; Nocturia; Snoring; Class 2 drug-induced obesity with serious comorbidity and body mass index (BMI) of 38.0 to 38.9 in adult; OSA (obstructive sleep apnea); and Obesity, Beginning BMI 36.94 on their problem list.  Main reasons for visit/main concerns/chief complaint: New Patient (Initial Visit) and Smoking cessation (Requesting Chantix, wants to know if it's okay to take with current medications.)    AI-Extracted: Discussed the use of AI scribe software for clinical note transcription with the patient, who gave verbal consent to proceed.  HISTORY OF PRESENT ILLNESS  52 year old female presents as a new patient primarily seeking smoking cessation assistance and specifically requesting Chantix, with concerns about medication interactions with her current psychiatric regimen. Patient reports an extensive psychiatric history including bipolar disorder and major depressive disorder, currently well-managed under psychiatric care with Vraylar and Lamictal. She notes significant improvement  with current psychiatric medication regimen.  Patient has been proactively pursuing weight management, having lost 30 pounds through a structured wellness program. Currently at BMI 33.68, she expresses concern about potential weight gain during smoking cessation. Previous attempts at weight management with metformin have shown limited effectiveness. Insurance coverage restrictions have prevented  access to newer weight loss medications.  Regarding musculoskeletal concerns, patient reports ongoing right knee pain following a fall. Pain has been effectively managed with Celebrex 200mg  daily, though she notes the injury's ligaments are still healing. She previously tried oxycodone but discontinued due to side effects, preferring non-opioid pain management.  Patient's current medication regimen includes Adderall for ADD, tretinoin 0.1% cream for acne and anti-aging (using appropriate sun protection), and as-needed Zofran for medication-induced nausea. She maintains regular follow-up with both psychiatry and ADD specialists for medication management.  Recent bloodwork from September 2024 shows all values within normal limits. Weight trend shows recent increase from 194 lbs in September to current 202 lbs, heightening patient's concern about weight management during smoking cessation.  Patient demonstrates good insight into her health conditions and actively engages in preventive care. She maintains compliance with her psychiatric medication regimen and expresses understanding of the need for lifestyle modifications for weight management.   Note that patient  has a past medical history of ADD (attention deficit disorder), Anxiety, Bipolar 1 disorder, depressed (HCC), Cannabis abuse with psychotic disorder with delusions (HCC) (05/22/2018), Cellulitis of left eyelid (01/2014), Depression, Dysmenorrhea, History of sleep apnea, Paranoid delusion (HCC) (05/22/2018), Schizophrenia (HCC), and Vitamin D deficiency.  Problem list overviews that were updated at today's visit:No problems updated.  Med reconciliation: Current Outpatient Medications on File Prior to Visit  Medication Sig   ALPRAZolam (XANAX) 1 MG tablet Take 1 mg by mouth at bedtime as needed for anxiety.   amphetamine-dextroamphetamine (ADDERALL) 20 MG tablet Take 1 tablet (20 mg total) by mouth 3 (three) times daily.   atomoxetine  (STRATTERA) 60 MG capsule Take 60 mg by mouth daily.   Cyanocobalamin 5000 MCG SUBL Place 1 tablet (5,000 mcg total) under the tongue once a week.   lamoTRIgine (LAMICTAL) 200 MG tablet Take 2 tablets (400 mg total) by mouth at bedtime.   Magnesium 500 MG CAPS Take by mouth.   methocarbamol (ROBAXIN) 750 MG tablet Take 1 tablet (750 mg total) by mouth 2 (two) times daily as needed for muscle spasms.   traZODone (DESYREL) 100 MG tablet Take 100 mg by mouth. Takes 200 mg at bedtime.   VRAYLAR 6 MG CAPS TAKE 1 CAPSULE BY MOUTH DAILY   No current facility-administered medications on file prior to visit.   Medications Discontinued During This Encounter  Medication Reason   VITAMIN D PO    atomoxetine (STRATTERA) 25 MG capsule    glycopyrrolate (ROBINUL) 1 MG tablet    clotrimazole-betamethasone (LOTRISONE) cream    neomycin-polymyxin-hydrocortisone (CORTISPORIN) OTIC solution    meloxicam (MOBIC) 15 MG tablet    amoxicillin-clavulanate (AUGMENTIN) 875-125 MG tablet    tretinoin (RETIN-A) 0.025 % cream    ibuprofen (ADVIL) 800 MG tablet Completed Course   oxybutynin (DITROPAN) 5 MG tablet Completed Course   celecoxib (CELEBREX) 100 MG capsule Expired Prescription   metFORMIN (GLUCOPHAGE-XR) 500 MG 24 hr tablet Reorder   ondansetron (ZOFRAN) 4 MG tablet Reorder   celecoxib (CELEBREX) 200 MG capsule Reorder     Objective   Physical Exam     11/09/2023    2:53 PM 09/22/2023    3:31 PM 09/02/2023  2:00 PM  Vitals with BMI  Height 5\' 5"  5\' 5"  5\' 5"   Weight 202 lbs 6 oz 198 lbs 3 oz 194 lbs  BMI 33.68 32.98 32.28  Systolic 102 110 811  Diastolic 79 74 68  Pulse 101 91 99   Wt Readings from Last 10 Encounters:  11/09/23 202 lb 6.4 oz (91.8 kg)  09/22/23 198 lb 3.2 oz (89.9 kg)  09/02/23 194 lb (88 kg)  07/15/23 197 lb (89.4 kg)  05/26/23 196 lb 3.2 oz (89 kg)  05/12/23 190 lb (86.2 kg)  04/29/23 195 lb (88.5 kg)  04/13/23 189 lb (85.7 kg)  03/18/23 191 lb (86.6 kg)  03/17/23  192 lb 12.8 oz (87.5 kg)   Vital signs reviewed.  Nursing notes reviewed. Weight trend reviewed. Abnormalities and Problem-Specific physical exam findings:  truncal adiposity   General Appearance:  No acute distress appreciable.   Well-groomed, healthy-appearing female.  Well proportioned with no abnormal fat distribution.  Good muscle tone. Pulmonary:  Normal work of breathing at rest, no respiratory distress apparent. SpO2: 97 %  Musculoskeletal: All extremities are intact.  Neurological:  Awake, alert, oriented, and engaged.  No obvious focal neurological deficits or cognitive impairments.  Sensorium seems unclouded.   Speech is clear and coherent with logical content. Psychiatric:  Appropriate mood, pleasant and cooperative demeanor, thoughtful and engaged during the exam  Results            No results found for any visits on 11/09/23.  Office Visit on 09/02/2023  Component Date Value   Vitamin B-12 09/02/2023 >2000 (H)    Hgb A1c MFr Bld 09/02/2023 5.1    Est. average glucose Bld* 09/02/2023 100    Vit D, 25-Hydroxy 09/02/2023 57.1    Glucose 09/02/2023 93    BUN 09/02/2023 14    Creatinine, Ser 09/02/2023 0.81    eGFR 09/02/2023 88    BUN/Creatinine Ratio 09/02/2023 17    Sodium 09/02/2023 142    Potassium 09/02/2023 4.4    Chloride 09/02/2023 102    CO2 09/02/2023 25    Calcium 09/02/2023 9.8    Total Protein 09/02/2023 6.8    Albumin 09/02/2023 4.6    Globulin, Total 09/02/2023 2.2    Bilirubin Total 09/02/2023 <0.2    Alkaline Phosphatase 09/02/2023 99    AST 09/02/2023 15    ALT 09/02/2023 14   Office Visit on 04/13/2023  Component Date Value   Vit D, 25-Hydroxy 04/13/2023 114.0 (H)   Office Visit on 01/22/2023  Component Date Value   WBC 01/22/2023 8.3    RBC 01/22/2023 4.02    Hemoglobin 01/22/2023 13.0    HCT 01/22/2023 37.7    MCV 01/22/2023 93.8    MCH 01/22/2023 32.3    MCHC 01/22/2023 34.5    RDW 01/22/2023 12.6    Platelets 01/22/2023 303    MPV  01/22/2023 9.0    Neutro Abs 01/22/2023 4,731    Lymphs Abs 01/22/2023 2,747    Absolute Monocytes 01/22/2023 681    Eosinophils Absolute 01/22/2023 91    Basophils Absolute 01/22/2023 50    Neutrophils Relative % 01/22/2023 57    Total Lymphocyte 01/22/2023 33.1    Monocytes Relative 01/22/2023 8.2    Eosinophils Relative 01/22/2023 1.1    Basophils Relative 01/22/2023 0.6    Glucose, Bld 01/22/2023 88    BUN 01/22/2023 20    Creat 01/22/2023 0.88    eGFR 01/22/2023 81    BUN/Creatinine Ratio 01/22/2023 SEE NOTE:  Sodium 01/22/2023 138    Potassium 01/22/2023 4.2    Chloride 01/22/2023 101    CO2 01/22/2023 28    Calcium 01/22/2023 9.9    Total Protein 01/22/2023 6.7    Albumin 01/22/2023 4.6    Globulin 01/22/2023 2.1    AG Ratio 01/22/2023 2.2    Total Bilirubin 01/22/2023 0.2    Alkaline phosphatase (AP* 01/22/2023 88    AST 01/22/2023 14    ALT 01/22/2023 12    Cholesterol 01/22/2023 144    HDL 01/22/2023 65    Triglycerides 01/22/2023 93    LDL Cholesterol (Calc) 01/22/2023 61    Total CHOL/HDL Ratio 01/22/2023 2.2    Non-HDL Cholesterol (Cal* 01/22/2023 79    TSH 01/22/2023 1.18    Hgb A1c MFr Bld 01/22/2023 5.3    Mean Plasma Glucose 01/22/2023 105    eAG (mmol/L) 01/22/2023 5.8    Insulin 01/22/2023 10.9    Vit D, 25-Hydroxy 01/22/2023 122 (H)    Color, Urine 01/22/2023 YELLOW    APPearance 01/22/2023 CLEAR    Specific Gravity, Urine 01/22/2023 1.017    pH 01/22/2023 < OR = 5.0    Glucose, UA 01/22/2023 NEGATIVE    Bilirubin Urine 01/22/2023 NEGATIVE    Ketones, ur 01/22/2023 NEGATIVE    Hgb urine dipstick 01/22/2023 NEGATIVE    Protein, ur 01/22/2023 NEGATIVE    Nitrite 01/22/2023 NEGATIVE    Leukocytes,Ua 01/22/2023 NEGATIVE    Creatinine, Urine 01/22/2023 106    Microalb, Ur 01/22/2023 0.9    Microalb Creat Ratio 01/22/2023 8    No image results found.   No results found.  MM 3D SCREENING MAMMOGRAM BILATERAL BREAST  Result Date:  03/24/2023 CLINICAL DATA:  Screening. EXAM: DIGITAL SCREENING BILATERAL MAMMOGRAM WITH TOMOSYNTHESIS AND CAD TECHNIQUE: Bilateral screening digital craniocaudal and mediolateral oblique mammograms were obtained. Bilateral screening digital breast tomosynthesis was performed. The images were evaluated with computer-aided detection. COMPARISON:  Previous exam(s). ACR Breast Density Category a: The breasts are almost entirely fatty. FINDINGS: There are no findings suspicious for malignancy. IMPRESSION: No mammographic evidence of malignancy. A result letter of this screening mammogram will be mailed directly to the patient. RECOMMENDATION: Screening mammogram in one year. (Code:SM-B-01Y) BI-RADS CATEGORY  1: Negative. Electronically Signed   By: Sande Brothers M.D.   On: 03/24/2023 14:45   SLEEP STUDY DOCUMENTS  Result Date: 03/24/2023 Ordered by an unspecified provider.        Additional Info: This encounter employed real-time, collaborative documentation. The patient actively reviewed and updated their medical record on a shared screen, ensuring transparency and facilitating joint problem-solving for the problem list, overview, and plan. This approach promotes accurate, informed care. The treatment plan was discussed and reviewed in detail, including medication safety, potential side effects, and all patient questions. We confirmed understanding and comfort with the plan. Follow-up instructions were established, including contacting the office for any concerns, returning if symptoms worsen, persist, or new symptoms develop, and precautions for potential emergency department visits.

## 2023-12-19 ENCOUNTER — Other Ambulatory Visit: Payer: Self-pay | Admitting: Nurse Practitioner

## 2023-12-19 DIAGNOSIS — R61 Generalized hyperhidrosis: Secondary | ICD-10-CM

## 2023-12-29 ENCOUNTER — Other Ambulatory Visit: Payer: Self-pay | Admitting: Internal Medicine

## 2023-12-29 DIAGNOSIS — E669 Obesity, unspecified: Secondary | ICD-10-CM

## 2024-01-06 ENCOUNTER — Other Ambulatory Visit: Payer: Self-pay

## 2024-01-06 ENCOUNTER — Other Ambulatory Visit: Payer: Self-pay | Admitting: Internal Medicine

## 2024-01-06 DIAGNOSIS — E669 Obesity, unspecified: Secondary | ICD-10-CM

## 2024-01-06 MED ORDER — RYBELSUS 3 MG PO TABS: 1.0000 | ORAL_TABLET | Freq: Every day | ORAL | 1 refills | Status: DC

## 2024-01-06 NOTE — Telephone Encounter (Signed)
Messaged PA team to start PA for this medication.

## 2024-01-07 ENCOUNTER — Other Ambulatory Visit: Payer: Self-pay | Admitting: Internal Medicine

## 2024-01-07 ENCOUNTER — Other Ambulatory Visit (HOSPITAL_COMMUNITY): Payer: Self-pay

## 2024-01-07 ENCOUNTER — Telehealth: Payer: Self-pay | Admitting: Pharmacy Technician

## 2024-01-07 DIAGNOSIS — E669 Obesity, unspecified: Secondary | ICD-10-CM

## 2024-01-07 NOTE — Telephone Encounter (Signed)
I've already requested a PA be started for this medication. The PA has been submitted by the PA team.

## 2024-01-07 NOTE — Telephone Encounter (Signed)
Pharmacy Patient Advocate Encounter   Received notification from Pt Calls Messages that prior authorization for Rybelsus 3MG  tablets is required/requested.   Insurance verification completed.   The patient is insured through [employer] .   Per test claim: PA required; PA submitted to above mentioned insurance via CoverMyMeds Key/confirmation #/EOC [id number] Status is pending

## 2024-01-08 ENCOUNTER — Other Ambulatory Visit: Payer: Self-pay | Admitting: Internal Medicine

## 2024-01-08 DIAGNOSIS — E669 Obesity, unspecified: Secondary | ICD-10-CM

## 2024-01-08 DIAGNOSIS — E88819 Insulin resistance, unspecified: Secondary | ICD-10-CM

## 2024-01-11 ENCOUNTER — Other Ambulatory Visit (HOSPITAL_COMMUNITY): Payer: Self-pay

## 2024-01-11 NOTE — Telephone Encounter (Signed)
Pharmacy Patient Advocate Encounter  Received notification from [employer] that Prior Authorization for Rybelsus 3MG  tablets  has been DENIED.  No reason given; No denial letter received via Fax or CMM. It has been requested and will be uploaded to the media tab once received.   PA #/Case ID/Reference #: [id number]

## 2024-01-11 NOTE — Telephone Encounter (Signed)
Left pt detailed message to call the office back or send message through Mychart to confirm how pt is taking medication, 2 tablets twice a day? Or another way?

## 2024-01-13 ENCOUNTER — Other Ambulatory Visit: Payer: Self-pay

## 2024-01-13 DIAGNOSIS — E88819 Insulin resistance, unspecified: Secondary | ICD-10-CM

## 2024-01-13 DIAGNOSIS — E669 Obesity, unspecified: Secondary | ICD-10-CM

## 2024-01-13 MED ORDER — METFORMIN HCL ER 500 MG PO TB24: ORAL_TABLET | ORAL | 3 refills | Status: DC

## 2024-01-25 ENCOUNTER — Ambulatory Visit (INDEPENDENT_AMBULATORY_CARE_PROVIDER_SITE_OTHER): Payer: Commercial Managed Care - HMO | Admitting: Internal Medicine

## 2024-01-25 ENCOUNTER — Encounter: Payer: Self-pay | Admitting: Internal Medicine

## 2024-01-25 ENCOUNTER — Encounter: Payer: BC Managed Care – PPO | Admitting: Nurse Practitioner

## 2024-01-25 VITALS — BP 112/68 | HR 98 | Temp 98.4°F | Ht 65.0 in | Wt 203.8 lb

## 2024-01-25 DIAGNOSIS — Z0001 Encounter for general adult medical examination with abnormal findings: Secondary | ICD-10-CM | POA: Diagnosis not present

## 2024-01-25 DIAGNOSIS — Z6838 Body mass index (BMI) 38.0-38.9, adult: Secondary | ICD-10-CM | POA: Diagnosis not present

## 2024-01-25 DIAGNOSIS — Z87891 Personal history of nicotine dependence: Secondary | ICD-10-CM

## 2024-01-25 DIAGNOSIS — F1221 Cannabis dependence, in remission: Secondary | ICD-10-CM | POA: Insufficient documentation

## 2024-01-25 DIAGNOSIS — Z780 Asymptomatic menopausal state: Secondary | ICD-10-CM

## 2024-01-25 DIAGNOSIS — E66812 Obesity, class 2: Secondary | ICD-10-CM | POA: Diagnosis not present

## 2024-01-25 DIAGNOSIS — F319 Bipolar disorder, unspecified: Secondary | ICD-10-CM

## 2024-01-25 DIAGNOSIS — E661 Drug-induced obesity: Secondary | ICD-10-CM

## 2024-01-25 DIAGNOSIS — Z1211 Encounter for screening for malignant neoplasm of colon: Secondary | ICD-10-CM

## 2024-01-25 DIAGNOSIS — Z5987 Material hardship due to limited financial resources, not elsewhere classified: Secondary | ICD-10-CM

## 2024-01-25 LAB — CBC WITH DIFFERENTIAL/PLATELET
Basophils Absolute: 0 10*3/uL (ref 0.0–0.1)
Basophils Relative: 0.7 % (ref 0.0–3.0)
Eosinophils Absolute: 0 10*3/uL (ref 0.0–0.7)
Eosinophils Relative: 0.3 % (ref 0.0–5.0)
HCT: 38.9 % (ref 36.0–46.0)
Hemoglobin: 12.9 g/dL (ref 12.0–15.0)
Lymphocytes Relative: 37.9 % (ref 12.0–46.0)
Lymphs Abs: 1.6 10*3/uL (ref 0.7–4.0)
MCHC: 33.1 g/dL (ref 30.0–36.0)
MCV: 95.7 fL (ref 78.0–100.0)
Monocytes Absolute: 0.4 10*3/uL (ref 0.1–1.0)
Monocytes Relative: 8.5 % (ref 3.0–12.0)
Neutro Abs: 2.2 10*3/uL (ref 1.4–7.7)
Neutrophils Relative %: 52.6 % (ref 43.0–77.0)
Platelets: 236 10*3/uL (ref 150.0–400.0)
RBC: 4.06 Mil/uL (ref 3.87–5.11)
RDW: 13.5 % (ref 11.5–15.5)
WBC: 4.3 10*3/uL (ref 4.0–10.5)

## 2024-01-25 LAB — HEMOGLOBIN A1C: Hgb A1c MFr Bld: 5.3 % (ref 4.6–6.5)

## 2024-01-25 LAB — COMPREHENSIVE METABOLIC PANEL
ALT: 26 U/L (ref 0–35)
AST: 26 U/L (ref 0–37)
Albumin: 4.2 g/dL (ref 3.5–5.2)
Alkaline Phosphatase: 81 U/L (ref 39–117)
BUN: 11 mg/dL (ref 6–23)
CO2: 29 meq/L (ref 19–32)
Calcium: 9 mg/dL (ref 8.4–10.5)
Chloride: 103 meq/L (ref 96–112)
Creatinine, Ser: 0.71 mg/dL (ref 0.40–1.20)
GFR: 99.01 mL/min (ref >60.00)
Glucose, Bld: 88 mg/dL (ref 70–99)
Potassium: 4.2 meq/L (ref 3.5–5.1)
Sodium: 141 meq/L (ref 135–145)
Total Bilirubin: 0.2 mg/dL (ref 0.2–1.2)
Total Protein: 6.7 g/dL (ref 6.0–8.3)

## 2024-01-25 LAB — LIPID PANEL
Cholesterol: 155 mg/dL (ref 0–200)
HDL: 71.2 mg/dL (ref >39.00)
LDL Cholesterol: 58 mg/dL (ref 0–99)
NonHDL: 84.2
Total CHOL/HDL Ratio: 2
Triglycerides: 133 mg/dL (ref 0.0–149.0)
VLDL: 26.6 mg/dL (ref 0.0–40.0)

## 2024-01-25 NOTE — Assessment & Plan Note (Signed)
 Will send out vbci to assist

## 2024-01-25 NOTE — Progress Notes (Signed)
 Phone [phone]  -- Comprehensive Physical Exam (CPE) Annual Office Visit  --  Patient:  Angelica Weeks      Age: 53 y.o.       Sex:  female  Date:   01/25/2024 Patient Care Team: Anthon Kins, MD as PCP - General (Internal Medicine) Devin Foerster, MD as Consulting Physician (Optometry) Dyanna Glasgow, DO as Consulting Physician (Obstetrics and Gynecology) Larance Plater, MD as Consulting Physician (Psychiatry) Lillian Rein, MD as Consulting Physician (Obstetrics and Gynecology) Today's Healthcare Provider: Anthon Kins, MD  ------------------------------------------------------------------------------------------------------------------------------------- Chief Complaint  Patient presents with   Annual Exam     Purpose of Visit: Comprehensive preventive health assessment and personalized health maintenance planning.  This encounter was conducted as a Comprehensive Physical Exam (CPE) preventive care annual visit. The patient's medical history and problem list were reviewed to inform individualized preventive care recommendations. No problem-specific medical treatment was provided during this visit.   Assessment & Plan Encounter for annual general medical examination with abnormal findings in adult Annual Physical Exam This routine annual physical exam revealed a smoking history of over 20 pack years, metabolic syndrome, and sleep apnea, with no acute complaints. Lung cancer screening eligibility was discussed due to smoking history and age over 84, along with insurance coverage and the importance of early detection. Cologuard was presented as a non-invasive alternative to colonoscopy, with a follow-up colonoscopy if results are positive. The importance of gynecological exams for breast and pelvic health was emphasized. A low carb diet and exercise were recommended for weight loss. Daily sinus rinses with saline spray were advised for sinus health and potential sleep  apnea improvement. Vitamin D  supplementation (2000 IU daily) was recommended due to postmenopausal status. Emphasis was placed on sunscreen use, condom use, regular exercise, a heart-healthy diet, and avoiding texting and driving and motorcycle use. Basic blood work including cholesterol, CBC, metabolic panel, thyroid , and A1c will be performed. A lung cancer screening CT scan will be ordered, and Cologuard will be sent for colon cancer screening. A referral to a gynecologist for breast and pelvic exams was made.   General Health Maintenance Routine preventive care includes ordering a lung cancer screening CT scan, sending Cologuard for colon cancer screening, and referring to a gynecologist for breast and pelvic exams. Vitamin D  supplementation (2000 IU daily) was recommended. Encouragement was given for 115 minutes of exercise per week and a heart-healthy diet. Advice was given against texting and driving and motorcycle use, with emphasis on sunscreen use and condom use.  Follow-up A one-year annual physical will be scheduled. Follow-up on lung cancer screening results, completion of the Cologuard test, and results will be conducted. Follow-up with a gynecologist for breast and pelvic exams will be arranged. Insurance coverage for weight loss medication for sleep apnea will be checked. Stopped smoking with greater than 20 pack year history Check LDCT, encouraged patient to maintain abstinence Class 2 drug-induced obesity with serious comorbidity and body mass index (BMI) of 38.0 to 38.9 in adult Patient did not want treatment today due to financial limitations.  Postmenopausal estrogen deficiency Encouraged patient to take vitamin D  offered vitamin D  testing but uncertain of insurance coverage and she has financial limitations. Bipolar 1 disorder, depressed (HCC) She denied current depression and gets treated elsewhere Morbid obesity (HCC) Metabolic Syndrome Overweight with insulin  resistance was  noted. The potential benefits of weight loss medications, particularly tirzepatide  available online, were discussed. FDA approval for weight loss drugs in patients with sleep apnea may  facilitate insurance coverage. A low carb diet and exercise were recommended, and the option of obtaining tirzepatide  online was discussed. Inadequate material resources Will send out vbci to assist   Diagnoses and all orders for this visit: Stopped smoking with greater than 20 pack year history -     Ambulatory Referral for Lung Cancer Screening [REF832] Encounter for annual general medical examination with abnormal findings in adult -     Ambulatory Referral for Lung Cancer Screening [REF832] -     Lipid panel -     Comprehensive metabolic panel -     CBC with Differential/Platelet -     TSH Rfx on Abnormal to Free T4 -     Cologuard -     Ambulatory referral to Gynecology -     HgB A1c Class 2 drug-induced obesity with serious comorbidity and body mass index (BMI) of 38.0 to 38.9 in adult -     Lipid panel -     TSH Rfx on Abnormal to Free T4 -     HgB A1c Postmenopausal estrogen deficiency History of cannabis dependence/abuse (HCC) Bipolar 1 disorder, depressed (HCC) Morbid obesity (HCC) Inadequate material resources    Encounter orders: ED Discharge Orders          Ordered    Ambulatory Referral for Lung Cancer Screening [REF832]       Comments: Lung cancer screening for >=20 pack year smoking history   01/25/24 0915    Lipid panel       Comments: [facility]    01/25/24 0915    Comprehensive metabolic panel        01/25/24 0915    CBC with Differential/Platelet        01/25/24 0915    TSH Rfx on Abnormal to Free T4        01/25/24 0915    Cologuard        01/25/24 0915    Ambulatory referral to Gynecology        01/25/24 0915    HgB A1c        01/25/24 0915             Today's preventive care visit included comprehensive health maintenance evaluation and gap closure  alongside extensive anticipatory guidance, as follows: #  Eye exams: recommended every 1-2 years.   She voiced understanding and intent to adhere to that plan. #  Dental health: recommended regular tooth brushing, flossing, and dental visits every 6 months.  She voiced understanding and intent to adhere to that plan. #  Sinus health: nightly SimplySaline or similar recommended now.  She voiced understanding and intent to adhere to that plan. #  Diet/Exercise:   recommended regular exercise (150 min) and diet rich and fruits and vegetables and fiber and healthy fats to reduce risk of heart attack and stroke. She voiced understanding and intent to adhere to this plan. #  Sexuality: recommended STD prevention via partner selection & condoms.  Offered contraception / STD checks / genital wart treatment if appropriate. #  Cervical/Breast/Uterine/Ovarian cancer screening: strongly recommended follow up gynecology for comprehensive screenings. #  Thyroid  cancer screening:  discussed need to palpate thyroid  about every 6 months for nodules #  Colon cancer screening:  Cologuard agreed upon #  Skin cancer screening: recommended regular sunscreen use. she denies worrisome, changing, or new skin lesions. #  Osteoporosis prevention:  recommended to maintain a good source of calcium and vitamin D  in diet.  She denies any personal  history of early osteoporosis or fragility fractures #  Substance use: recommended absolute abstinence from all vaped or smoked recreational or illicit substances of abuse such as tobacco, nicotine, alcohol, illicit drugs, even sugar.  #  Safety:  recommended avoiding high risk activities, wear seat belts and not texting & driving #  Health maintenance and immunizations reviewed and she was encouraged to complete any incomplete and release any missing immunization records for us  Immunization History  Administered Date(s) Administered   PFIZER(Purple Top)SARS-COV-2 Vaccination 04/13/2020,  07/04/2020   PPD Test 02/13/2015, 04/13/2017   Pneumococcal Polysaccharide-23 10/06/2018   Tdap 02/13/2015   #  We attempted to update vaccination records and provide any missing vaccines that are recommended. Health Maintenance Due  Topic Date Due   Colonoscopy  Never done   Pneumococcal Vaccine 87-93 Years old (2 of 2 - PCV) 10/07/2019   Lung Cancer Screening  Never done   Cervical Cancer Screening (HPV/Pap Cotest)  12/18/2023    # Incomplete health maintenance issues listed above were brought up for discussion and she was encouraged to complete with our assistance. # Recommended follow up:  continued annual preventive health maintenance exams  Subjective   She has ADD (attention deficit disorder); Bipolar 1 disorder, depressed (HCC); GERD (gastroesophageal reflux disease); BMI 32.0-32.9,adult; Elevated prolactin level; Insulin  resistance; Fatigue; Postmenopausal estrogen deficiency; Metabolic syndrome; Moderate recurrent major depression (HCC); Sleep disturbance; Daytime somnolence; Elevated fasting blood sugar; Vitamin D  deficiency; B12 deficiency; Hypersomnia, persistent; Fatigue due to depression; Insomnia due to other mental disorder; Nocturia; Snoring; Class 2 drug-induced obesity with serious comorbidity and body mass index (BMI) of 38.0 to 38.9 in adult; OSA (obstructive sleep apnea); Obesity, Beginning BMI 36.94; Smoking; Acne vulgaris; Osteoarthritis of right knee; History of cannabis dependence/abuse (HCC); and Inadequate material resources on their problem list.  53 year old female who presents for an annual physical exam.  She has a history of smoking more than 20 pack years and quit smoking on December 31st. Due to her smoking history and age, she is eligible for lung cancer screening. She has gained weight since quitting smoking and has a history of sleep apnea and metabolic syndrome, characterized by being overweight with some insulin  resistance. She is not currently pursuing  weight loss interventions.  She denies any current depression and reports no drug use or excessive consumption of processed foods. She enjoys cake but does not report any other significant dietary concerns. She has not had a colonoscopy before and has not been following with a gynecologist for a long time, although she has been receiving mammograms regularly.  No tenderness in her spine or abdomen, and no lumps or bumps are reported. She has not experienced any recent illnesses that would explain her left eardrum's unhealthy appearance, though she acknowledges a possible chronic sinus infection.  She has a family history of cancer, as her brother has terminal cancer.  Past Medical History - Depression - Overweight with some insulin  resistance   ROS  A comprehensive ROS was negative for any concerning symptoms.  Disclaimer about ROS at Annual Preventive Visits Patients are informed before the Review of Systems (ROS) that identifying significant medical issues during the wellness visit may require immediate attention, potentially resulting in a separate billable encounter beyond the scope of the preventive exam. This disclosure is mandated by professional ethics and legal obligations, as healthcare providers must address any substantial health concerns raised during any patient interaction. A comprehensive ROS is required by insurance companies for billing the visit.  However, this structure may inadvertently discourage patients from fully disclosing health concerns due to potential financial implications. Consequently, patients often emphasize that any positive ROS findings are related to stable chronic conditions, requesting that these not be discussed during the preventive visit to avoid additional charges. Patients may also ask that reported complaints not be listed in the ROS to prevent affecting billing.Patient's Request Regarding Billing and Review of Systems   PROBLEMS,PMH, PSH, FH, prior  meds, allergies, and SH were each reviewed and updated:    01/25/2024    9:00 AM  Depression screen PHQ 2/9  Decreased Interest 0  Down, Depressed, Hopeless 0  PHQ - 2 Score 0   Patient Active Problem List   Diagnosis Date Noted   History of cannabis dependence/abuse (HCC) 01/25/2024   Inadequate material resources 01/25/2024   Smoking 11/09/2023   Acne vulgaris 11/09/2023   Osteoarthritis of right knee 11/09/2023   Obesity, Beginning BMI 36.94 09/02/2023   OSA (obstructive sleep apnea) 10/14/2022   Hypersomnia, persistent 06/30/2022   Fatigue due to depression 06/30/2022   Insomnia due to other mental disorder 06/30/2022   Nocturia 06/30/2022   Snoring 06/30/2022   Class 2 drug-induced obesity with serious comorbidity and body mass index (BMI) of 38.0 to 38.9 in adult 06/30/2022   Elevated fasting blood sugar 05/28/2022   Vitamin D  deficiency 05/28/2022   B12 deficiency 05/28/2022   Daytime somnolence 04/30/2022   Fatigue 03/24/2022   Postmenopausal estrogen deficiency 03/24/2022   Metabolic syndrome 03/24/2022   Moderate recurrent major depression (HCC) 03/24/2022   Sleep disturbance 03/24/2022   Insulin  resistance 01/23/2022   Elevated prolactin level 01/25/2019   BMI 32.0-32.9,adult 10/06/2018   GERD (gastroesophageal reflux disease) 10/05/2018   ADD (attention deficit disorder)    Bipolar 1 disorder, depressed (HCC)    Past Medical History:  Diagnosis Date   ADD (attention deficit disorder)    Anxiety    Bipolar 1 disorder, depressed (HCC)    Cannabis abuse with psychotic disorder with delusions (HCC) 05/22/2018   Cellulitis of left eyelid 01/2014   Depression    Dysmenorrhea    History of sleep apnea    Paranoid delusion (HCC) 05/22/2018   Schizophrenia (HCC)    Smoker unmotivated to quit 10/06/2018   Vitamin D  deficiency     Past Surgical History:  Procedure Laterality Date   WISDOM TOOTH EXTRACTION     Family History  Adopted: Yes  Family history  unknown: Yes   Allergies  Allergen Reactions   Aspirin Other (See Comments)    Burns stomach    Social History   Tobacco Use   Smoking status: Every Day    Current packs/day: 1.00    Average packs/day: 1 pack/day for 35.1 years (35.1 ttl pk-yrs)    Types: Cigarettes    Start date: 1990    Passive exposure: Current   Smokeless tobacco: Never  Vaping Use   Vaping status: Never Used  Substance Use Topics   Alcohol use: Not Currently    Comment: Last in 2021.   Drug use: Not Currently    Types: Marijuana, Amphetamines, Benzodiazepines    Comment: last in 2021, none since     I attest that I have reviewed and confirmed the patients current medications to meet the medication reconciliation requirement Current Outpatient Medications on File Prior to Visit  Medication Sig   ALPRAZolam  (XANAX ) 1 MG tablet Take 1 mg by mouth at bedtime as needed for anxiety.   amoxicillin  (AMOXIL ) 500  MG capsule Take 500 mg by mouth 3 (three) times daily.   amphetamine -dextroamphetamine  (ADDERALL) 20 MG tablet Take 1 tablet (20 mg total) by mouth 3 (three) times daily.   atomoxetine  (STRATTERA ) 60 MG capsule Take 60 mg by mouth daily.   celecoxib  (CELEBREX ) 200 MG capsule Take 1 capsule (200 mg total) by mouth daily. Do not take with ibuprofen  or aleve (naproxen).  Drink full glass of water with each dose   Cyanocobalamin  5000 MCG SUBL Place 1 tablet (5,000 mcg total) under the tongue once a week.   lamoTRIgine  (LAMICTAL ) 200 MG tablet Take 2 tablets (400 mg total) by mouth at bedtime.   metFORMIN  (GLUCOPHAGE -XR) 500 MG 24 hr tablet Always take with meals. To start: 1 tab daily for 1 week, then 1 tablet twice daily for 1 week, then 2 tablets twice daily afterwards, if tolerated.   ondansetron  (ZOFRAN ) 4 MG tablet TAKE 1 TABLET BY MOUTH 3 TIMES A DAY IF NEEDED FOR NAUSEA   traZODone  (DESYREL ) 100 MG tablet Take 100 mg by mouth. Takes 200 mg at bedtime.   tretinoin  (RETIN-A ) 0.1 % cream Apply topically at  bedtime.   varenicline  (CHANTIX  CONTINUING MONTH PAK) 1 MG tablet Take 1 tablet (1 mg total) by mouth 2 (two) times daily.   VRAYLAR 6 MG CAPS TAKE 1 CAPSULE BY MOUTH DAILY   No current facility-administered medications on file prior to visit.   Medications Discontinued During This Encounter  Medication Reason   Magnesium  500 MG CAPS    methocarbamol  (ROBAXIN ) 750 MG tablet    Varenicline  Tartrate, Starter, (CHANTIX  STARTING MONTH PAK) 0.5 MG X 11 & 1 MG X 42 TBPK    Semaglutide  (RYBELSUS ) 3 MG TABS   She reported she is not taking the discontinued medications, wasn't even able to obtain Rybelsus  due to insurance   Objective  BP 112/68   Pulse 98   Temp 98.4 F (36.9 C) (Temporal)   Ht 5\' 5"  (1.651 m)   Wt 203 lb 12.8 oz (92.4 kg)   LMP  (LMP Unknown) Comment: "I've not had a period in 3-4 years"  SpO2 98%   BMI 33.91 kg/m  Body mass index is 33.91 kg/m.  Wt Readings from Last 3 Encounters:  01/25/24 203 lb 12.8 oz (92.4 kg)  11/09/23 202 lb 6.4 oz (91.8 kg)  09/22/23 198 lb 3.2 oz (89.9 kg)    GENERAL:  NAD, AAO, somewhat ill-appearing  due to wearing mask for cough that doesn't occur during visit and having slight sweat on face. HEENT: Left eardrum not healthy. Nasal airflow unobstructed. Significant dental staining and malocclusion/alignment issues NECK: No thyroid  nodules; neck shows lumpiness likely due to weight. CHEST: Lung sounds clear. CARDIOVASCULAR: Heart sounds normal. ABDOMEN: Abdomen soft, non-tender, without palpable masses. EYES:  sclera nonicteric, no injection CV:  RRR, no murmurs/rubs/gallops LUNG: CTAB, normal WOB, no audible wheezing or stridor ABD: soft, nondistended, no guarding, no palpable tumor GYN:  expressed a preference to do breast and gynecological exam at another office which  I supported and encouraged explaining the importance of routine gynecological screenings. SKIN: warm, dry, no lesions of concern NEURO: alert, no focal deficit  obvious, articulate speech PSYCH: normal mood, behavior, thought content  Health Maintenance, Female Adopting a healthy lifestyle and getting preventive care are important in promoting health and wellness. Ask your health care provider about: The right schedule for you to have regular tests and exams. Things you can do on your own to prevent diseases and  keep yourself healthy. What should I know about diet, weight, and exercise? Eat a healthy diet  Eat a diet that includes plenty of vegetables, fruits, low-fat dairy products, and lean protein. Do not eat a lot of foods that are high in solid fats, added sugars, or sodium. Maintain a healthy weight Body mass index (BMI) is used to identify weight problems. It estimates body fat based on height and weight. Your health care provider can help determine your BMI and help you achieve or maintain a healthy weight. Get regular exercise Get regular exercise. This is one of the most important things you can do for your health. Most adults should: Exercise for at least 150 minutes each week. The exercise should increase your heart rate and make you sweat (moderate-intensity exercise). Do strengthening exercises at least twice a week. This is in addition to the moderate-intensity exercise. Spend less time sitting. Even light physical activity can be beneficial. Watch cholesterol and blood lipids Have your blood tested for lipids and cholesterol at 51 years of age, then have this test every 5 years. Have your cholesterol levels checked more often if: Your lipid or cholesterol levels are high. You are older than 51 years of age. You are at high risk for heart disease. What should I know about cancer screening? Depending on your health history and family history, you may need to have cancer screening at various ages. This may include screening for: Breast cancer. Cervical cancer. Colorectal cancer. Skin cancer. Lung cancer. What should I know about  heart disease, diabetes, and high blood pressure? Blood pressure and heart disease High blood pressure causes heart disease and increases the risk of stroke. This is more likely to develop in people who have high blood pressure readings or are overweight. Have your blood pressure checked: Every 3-5 years if you are 28-51 years of age. Every year if you are 17 years old or older. Diabetes Have regular diabetes screenings. This checks your fasting blood sugar level. Have the screening done: Once every three years after age 36 if you are at a normal weight and have a low risk for diabetes. More often and at a younger age if you are overweight or have a high risk for diabetes. What should I know about preventing infection? Hepatitis B If you have a higher risk for hepatitis B, you should be screened for this virus. Talk with your health care provider to find out if you are at risk for hepatitis B infection. Hepatitis C Testing is recommended for: Everyone born from [birth year] through 1963. Anyone with known risk factors for hepatitis C. Sexually transmitted infections (STIs) Get screened for STIs, including gonorrhea and chlamydia, if: You are sexually active and are younger than 51 years of age. You are older than 51 years of age and your health care provider tells you that you are at risk for this type of infection. Your sexual activity has changed since you were last screened, and you are at increased risk for chlamydia or gonorrhea. Ask your health care provider if you are at risk. Ask your health care provider about whether you are at high risk for HIV. Your health care provider may recommend a prescription medicine to help prevent HIV infection. If you choose to take medicine to prevent HIV, you should first get tested for HIV. You should then be tested every 3 months for as long as you are taking the medicine. Pregnancy If you are about to stop having your period (premenopausal) and  you may  become pregnant, seek counseling before you get pregnant. Take 400 to 800 micrograms (mcg) of folic acid  every day if you become pregnant. Ask for birth control (contraception) if you want to prevent pregnancy. Osteoporosis and menopause Osteoporosis is a disease in which the bones lose minerals and strength with aging. This can result in bone fractures. If you are 62 years old or older, or if you are at risk for osteoporosis and fractures, ask your health care provider if you should: Be screened for bone loss. Take a calcium or vitamin D  supplement to lower your risk of fractures. Be given hormone replacement therapy (HRT) to treat symptoms of menopause. Follow these instructions at home: Alcohol use Do not drink alcohol if: Your health care provider tells you not to drink. You are pregnant, may be pregnant, or are planning to become pregnant. If you drink alcohol: Limit how much you have to: 0-1 drink a day. Know how much alcohol is in your drink. In the [place] one drink equals one 12 oz bottle of beer (355 mL), one 5 oz glass of wine (148 mL), or one 1 oz glass of hard liquor (44 mL). Lifestyle Do not use any products that contain nicotine or tobacco. These products include cigarettes, chewing tobacco, and vaping devices, such as e-cigarettes. If you need help quitting, ask your health care provider. Do not use street drugs. Do not share needles. Ask your health care provider for help if you need support or information about quitting drugs. General instructions Schedule regular health, dental, and eye exams. Stay current with your vaccines. Tell your health care provider if: You often feel depressed. You have ever been abused or do not feel safe at home. Summary Adopting a healthy lifestyle and getting preventive care are important in promoting health and wellness. Follow your health care provider's instructions about healthy diet, exercising, and getting tested or screened for  diseases. Follow your health care provider's instructions on monitoring your cholesterol and blood pressure. This information is not intended to replace advice given to you by your health care provider. Make sure you discuss any questions you have with your health care provider. Document Revised: 04/22/2021 Document Reviewed: 04/22/2021 Elsevier Patient Education  2024 [employer].

## 2024-01-25 NOTE — Assessment & Plan Note (Signed)
 She denied current depression and gets treated elsewhere

## 2024-01-25 NOTE — Assessment & Plan Note (Signed)
 Encouraged patient to take vitamin D  offered vitamin D  testing but uncertain of insurance coverage and she has financial limitations.

## 2024-01-25 NOTE — Patient Instructions (Addendum)
 VISIT SUMMARY:  Today, you had your annual physical exam. We discussed your smoking history, weight gain since quitting smoking, sleep apnea, and metabolic syndrome. We also talked about lung cancer screening, colon cancer screening, and the importance of gynecological exams. Recommendations were made for a low carb diet, exercise, and daily sinus rinses. Vitamin D  supplementation was advised, and routine blood work was ordered. A lung cancer screening CT scan and Cologuard test for colon cancer screening will be arranged. A referral to a gynecologist was made for breast and pelvic exams.  YOUR PLAN:  -ANNUAL PHYSICAL EXAM: Your annual physical exam showed a history of smoking, metabolic syndrome, and sleep apnea. We discussed lung cancer screening due to your smoking history and age, and the importance of early detection. Cologuard was suggested as a non-invasive alternative to colonoscopy. We emphasized the importance of gynecological exams for breast and pelvic health. A low carb diet and exercise were recommended for weight loss. Daily sinus rinses with saline spray were advised for sinus health. Vitamin D  supplementation (2000 IU daily) was recommended. General lifestyle advice included using sunscreen, practicing safe sex, regular exercise, a heart-healthy diet, and avoiding texting and driving and motorcycle use. Routine blood work will be performed, and a lung cancer screening CT scan and Cologuard test will be arranged. A referral to a gynecologist was made.  -METABOLIC SYNDROME: Metabolic syndrome means you are overweight and have some insulin  resistance. We discussed the potential benefits of weight loss medications, particularly tirzepatide , and the possibility of insurance coverage due to your sleep apnea. A low carb diet and exercise were recommended.  -SLEEP APNEA: Sleep apnea is a condition where your breathing stops and starts during sleep. We discussed the potential for weight loss  medications to be covered by insurance due to this condition, as weight loss could improve your symptoms. We will check insurance coverage for weight loss medication.  -CHRONIC SINUSITIS: Chronic sinusitis is a long-term sinus infection that may be contributing to your ear issues and sleep apnea. Daily sinus rinses with saline spray were recommended to improve your sinus health.  INSTRUCTIONS:  We will schedule your next annual physical in one year. Follow-up on the results of your lung cancer screening and Cologuard test will be conducted. Please arrange a follow-up with a gynecologist for breast and pelvic exams. We will check insurance coverage for weight loss medication for your sleep apnea.  Building Your Long-Term Health Plan  During today's preventive visit, we covered a variety of important health checks to help you stay on top of your well-being.  We also discussed strategies to maintain your health and identified some areas that might benefit from further exploration.   Preventive care visits like today's are designed to be proactive, but sometimes additional attention may be needed.  Rest assured, we're here for you.  If these areas require further evaluation or management, we'd be happy to schedule a separate, focused appointment to address them in detail.  Addressing Next Steps  [x]   Follow-up Visit: To ensure we address any unresolved issues and continue monitoring your overall health, we recommend scheduling a follow-up appointment in 1 year for your next preventive care visit. If you experience any new problems, need to discuss any medical concerns, or your condition worsens before then, please don't hesitate to call our office to schedule an appointment or seek emergency care as needed.  [x]   Preventive Measures: Maintaining healthy habits plays a crucial role in overall wellness. We recommend considering these  tips: [x]   Regular appointments with dental and vision  professionals [x]   Nightly nasal saline mist to keep sinuses clear [x]   Consistent toothbrushing to maintain oral health [x]   Using an app like SnoreLab to track sleep quality [x]   Routine checks of blood pressure and heart rate [x]   Medical Information: In some instances, we may require additional medical information from other providers to create a comprehensive picture of your health. If applicable, we can provide a medical information release form at the front desk for you to sign, allowing us  to gather these records. [x]   Lab Tests: If any lab tests were ordered today, scheduling them within a week of your visit helps ensure the best possible insurance coverage.  Planning Follow Up to Work on a Problem? Make the Most of Our Focused (20 minute) Appointments  [x]   Clearly state your top concerns at the beginning of the visit to focus our discussion [x]   If you anticipate you will need more time, please inform the front desk during scheduling - we can book multiple appointments in the same week. [x]   If you have transportation problems- use our convenient video appointments or ask about transportation support. [x]   We can get down to business faster if you use MyChart to update information before the visit and submit non-urgent questions before your visit. Thank you for taking the time to provide details through MyChart.  Let our nurse know and she can import this information into your encounter documents.  Arrival and Wait Times  [x]   Arriving on time ensures that everyone receives prompt attention. [x]   Early morning (8a) and afternoon (1p) appointments tend to have shortest wait times. [x]   Unfortunately, we cannot delay appointments for late arrivals or hold slots during phone calls.  Bring to Your Next Appointment:  [x]   Medications: Please bring all your medication bottles to your next appointment to ensure we have an accurate record of your prescriptions. [x]   Health Diaries: If  you're monitoring any health conditions at home, keeping a diary of your readings can be very helpful for discussions at your next appointment.  Reviewing Your Records  [x]   Review your attached preventive care information at the end of these patient instructions. [x]   Review this early draft of your clinical encounter notes below and the final encounter summary tomorrow on MyChart after its been completed.      Getting Answers and Following Up  [x]   Simple Questions & Concerns: For quick questions or basic follow-up after your visit, reach us  at [phone] or MyChart messaging. [x]   Complex Concerns: If your concern is more complex, scheduling an appointment might be best. Discuss this with the staff to find the most suitable option. [x]   Lab & Imaging Results: We'll contact you directly if results are abnormal or you don't use MyChart. Most normal results will be on MyChart within 2-3 business days, with a review message from Dr. Boston Byers. Haven't heard back in 2 weeks? Need results sooner? Contact us  at [phone]. [x]   Referrals: Our referral coordinator will manage specialist referrals. The specialist's office should contact you within 2 weeks to schedule an appointment. Call us  if you haven't heard from them after 2 weeks.  Staying Connected  [x]   MyChart: Activate your MyChart for the fastest way to access results and message us . See the last page of this paperwork for instructions on how to activate.  Billing  [x]   X-ray & Lab Orders: These are billed by separate  companies. Contact the invoicing company directly for questions or concerns. [x]   Visit Charges: Discuss any billing inquiries with our administrative services team.  Your Satisfaction Matters  [x]   Share Your Experience: We strive for your satisfaction! If you have any complaints, or preferably compliments, please let Dr. Boston Byers know directly or contact our Practice Administrators, Olinda Bertrand or [employer], by asking at the front desk.                 Next Steps  [x]   Schedule Follow-Up:  We recommend a follow-up appointment in 1 year for your next wellness visit.  If you develop any new problems, want to address any medical issues, or your condition worsens before then, please call us  for an appointment or seek emergency care. [x]   Preventive Care:  Make sure to keep regular appointments with dental and vision professionals, use nightly nasal saline mist sprays to keep your sinuses clear and toothbrushing to protect your teeth. Use SnoreLab App or other app to track your sleep quality. Check blood pressure and heart rate routinely. [x]   Medical Information Release:  For any relevant medical information we don't have, please sign a release form at the front desk so we can obtain it for your records. [x]   Lab Tests:  Schedule any lab tests from today for within a week to ensure best insurance coverage.    Making the Most of Our Focused (20 minute) Appointments:  [x]   Clearly state your top concerns at the beginning of the visit to focus our discussion [x]   If you anticipate you will need more time, please inform the front desk during scheduling - we can book multiple appointments in the same week. [x]   If you have transportation problems- use our convenient video appointments or ask about transportation support. [x]   We can get down to business faster if you use MyChart to update information before the visit and submit non-urgent questions before your visit. Thank you for taking the time to provide details through MyChart.  Let our nurse know and she can import this information into your encounter documents.  Arrival and Wait Times: [x]   Arriving on time ensures that everyone receives prompt attention. [x]   Early morning (8a) and afternoon (1p) appointments tend to have shortest wait times. [x]   Unfortunately, we cannot delay appointments for late arrivals or hold slots during  phone calls.  Bring to Your Next Appointment  [x]   Medications: Please bring all your medication bottles to your next appointment to ensure we have an accurate record of your prescriptions. [x]   Health Diaries: If you're monitoring any health conditions at home, keeping a diary of your readings can be very helpful for discussions at your next appointment.  Reviewing Your Records  [x]   Review your attached preventive care information at the end of these patient instructions. [x]   Review this early draft of your clinical encounter notes below and the final encounter summary tomorrow on MyChart after its been completed.   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 [x]   Complex Concerns: If your concern is more complex, scheduling an appointment might be best. Discuss this with the staff to find the most suitable option. [x]   Lab & Imaging Results: We'll contact you directly if results are abnormal or you don't use MyChart. Most normal  results will be on MyChart within 2-3 business days, with a review message from Dr. Boston Byers. Haven't heard back in 2 weeks? Need results sooner? Contact us  at [phone]. [x]   Referrals: Our referral coordinator will manage specialist referrals. The specialist's office should contact you within 2 weeks to schedule an appointment. Call us  if you haven't heard from them after 2 weeks.  Staying Connected  [x]   MyChart: Activate your MyChart for the fastest way to access results and message us . See the last page of this paperwork for instructions on how to activate.  Billing  [x]   X-ray & Lab Orders: These are billed by separate companies. Contact the invoicing company directly for questions or concerns. [x]   Visit Charges: Discuss any billing inquiries with our administrative services team.  Your Satisfaction Matters  [x]   Share Your Experience: We strive for your satisfaction! If you have any complaints, or preferably compliments, please let Dr. Boston Byers know directly or contact our Practice Administrators, Olinda Bertrand or [employer], by asking at the front desk.

## 2024-01-25 NOTE — Assessment & Plan Note (Signed)
 Patient did not want treatment today due to financial limitations.

## 2024-01-26 LAB — TSH RFX ON ABNORMAL TO FREE T4: TSH: 1.39 u[IU]/mL (ref 0.450–4.500)

## 2024-02-02 ENCOUNTER — Telehealth: Payer: Self-pay

## 2024-02-02 NOTE — Progress Notes (Unsigned)
 Care Guide Pharmacy Note  02/02/2024 Name: ADDALYNN KUMARI MRN: [id number] DOB: 11/02/1971  Referred By: Lula Olszewski, MD Reason for referral: Care Coordination (Outreach to schedule with Pharm d )   MASYN FULLAM is a 53 y.o. year old female who is a primary care patient of Lula Olszewski, MD.  Garey Ham Montminy was referred to the pharmacist for assistance related to:  obesity   An unsuccessful telephone outreach was attempted today to contact the patient who was referred to the pharmacy team for assistance with medication assistance. Additional attempts will be made to contact the patient.  Penne Lash , RMA     [facility]  [facility], [facility] Guide  Direct Dial: [phone]  Website: [email or web address]

## 2024-02-03 NOTE — Progress Notes (Signed)
 Care Guide Pharmacy Note  02/03/2024 Name: Angelica Weeks MRN: [id number] DOB: [birth year]  Referred By: Lula Olszewski, MD Reason for referral: Care Coordination (Outreach to schedule with Pharm d )   Angelica Weeks is a 53 y.o. year old female who is a primary care patient of Lula Olszewski, MD.  [name] was referred to the pharmacist for assistance related to:  obesity   Successful contact was made with the patient to discuss pharmacy services including being ready for the pharmacist to call at least 5 minutes before the scheduled appointment time and to have medication bottles and any blood pressure readings ready for review. The patient agreed to meet with the pharmacist via telephone visit on (date/time).02/04/2024  Penne Lash , RMA     [place] [facility]  [facility], [facility] Guide  Direct Dial: [phone]  Website: [email or web address]

## 2024-02-04 ENCOUNTER — Other Ambulatory Visit: Payer: Self-pay | Admitting: Pharmacist

## 2024-02-04 NOTE — Progress Notes (Signed)
 02/04/2024 Name: Angelica Weeks MRN: [id number] DOB: [birth year]  Chief Complaint  Patient presents with   Medication Management    Angelica Weeks is a 53 y.o. year old female who presented for a telephone visit.   They were referred to the pharmacist by their PCP for assistance in managing medication access.    Subjective:   Obesity/Overweight, Complicated by obstructive sleep apnea and insulin resistance and meniscus tear of right knee:  She is also taking medications to treat bipolar I that can cause weight gain - Vraylar and trazodone  Current medications: metformin 500mg  -take 2 tablets = 1000mg  daily  Weight Management treatments previously prescribed:   Has been [facility] since around 05/2022; She had lost 30 lbs but she quit smoking 12/15/2023 and has gained about 15 lbs back.  She is following low calorie diet prescribed by [facility].   Prescribed phertermine 37.5mg  09/2019 - didn't seem to be very effective.  Also took [place] (use coupon) from around 11/2021 thru 07/2022 - per notes she got up tomax dose of 15 mg weekly but did not see significant amount of weight loss.   Contrave - naltrexone + bupropion prescribed by Dr Vaughan Basta 05/2019 but never started due to not being covered by her insurance.  Qysemia - too expensive  Current physical activity: walks her dog some  Angelica Weeks was referred by her PCP to assist with applying for prior authorization for Zepbound.  Weight = 203 lbs Peak weight was 223 lbs Lowest weight was 190lbs in  Height = 65 inches BMI = 33.91   Medication Access/Adherence  Current Pharmacy:  [id number] - , [place] - [address] AT COR[address] OF [facility] [address] [place] [address] [place] [address] Phone: [phone] Fax: [phone]   - [facility] [address] [place] [address] Phone: [phone] Fax:  [phone]   Patient reports affordability concerns with their medications: Yes  Patient reports access/transportation concerns to their pharmacy: No  Patient reports adherence concerns with their medications:  No       Objective:  Lab Results  Component Value Date   HGBA1C 5.3 01/25/2024    Lab Results  Component Value Date   CREATININE 0.71 01/25/2024   BUN 11 01/25/2024   NA 141 01/25/2024   K 4.2 01/25/2024   CL 103 01/25/2024   CO2 29 01/25/2024    Lab Results  Component Value Date   CHOL 155 01/25/2024   HDL 71.20 01/25/2024   LDLCALC 58 01/25/2024   TRIG 133.0 01/25/2024   CHOLHDL 2 01/25/2024    Medications Reviewed Today     Reviewed by Henrene Pastor, RPH-CPP (Pharmacist) on 02/04/24 at 1513  Med List Status: <None>   Medication Order Taking? Sig Documenting Provider Last Dose Status Informant  ALPRAZolam (XANAX) 1 MG tablet [id number] Yes Take 1 mg by mouth at bedtime as needed for anxiety. [provider] Taking Active   amphetamine-dextroamphetamine (ADDERALL) 20 MG tablet [id number] Yes Take 1 tablet (20 mg total) by mouth 3 (three) times daily.  Taking Active   atomoxetine (STRATTERA) 60 MG capsule [id number] Yes Take 60 mg by mouth daily. [provider] Taking Active   celecoxib (CELEBREX) 200 MG capsule [id number] Yes Take 1 capsule (200 mg total) by mouth daily. Do not take with ibuprofen or aleve (naproxen).  Drink full glass of water with each dose Lula Olszewski, MD Taking Active  Cyanocobalamin 5000 MCG SUBL [id number] Yes Place 1 tablet (5,000 mcg total) under the tongue once a week. Judd Gaudier, NP Taking Active   lamoTRIgine (LAMICTAL) 200 MG tablet [id number] Yes Take 2 tablets (400 mg total) by mouth at bedtime. Jerene Bears, MD Taking Active   metFORMIN (GLUCOPHAGE-XR) 500 MG 24 hr tablet [id number] Yes Always take with meals. To start: 1 tab daily for 1 week, then 1 tablet twice daily for 1 week, then 2 tablets twice  daily afterwards, if tolerated. Lula Olszewski, MD Taking Active   ondansetron [facility]) 4 MG tablet [id number] Yes TAKE 1 TABLET BY MOUTH 3 TIMES A DAY IF NEEDED FOR NAUSEA Lula Olszewski, MD Taking Active   traZODone (DESYREL) 100 MG tablet [id number]  Take 300 mg by mouth at bedtime. [provider]  Active   tretinoin (RETIN-A) 0.1 % cream [id number] Yes Apply topically at bedtime. Lula Olszewski, MD Taking Active   varenicline (CHANTIX CONTINUING MONTH PAK) 1 MG tablet [id number] Yes Take 1 tablet (1 mg total) by mouth 2 (two) times daily. Lula Olszewski, MD Taking Active   VRAYLAR 6 MG CAPS [id number] Yes TAKE 1 CAPSULE BY MOUTH DAILY Doree Albee, PA-C Taking Active               Assessment/Plan:   Obesity/Overweight: - She had successfully lost about 10% of weight with diet changes and the assistance of [facility] but when she quit smoking she gained 15lbs.  - Given that she also has sleep apnea, called her insurance [employer]uerbach (she has an [employer] plan) but they state Zepbound is not covered by her plan for any indication at this time. Zepbound is currently the only GLP1 that is FDA approved for sleep apnea.  - [facility] to follow up with [facility].  - Encourage her to restart walking program or other exercise she enjoys.  - Reviewed her past trials of weight loss medications. Naltrexone 25 to 50 daily might be a low cost option that could help with cravings. It is usually in combo with bupropion in Contrave but given her other psychiatric medications, I would not recommend bupropion unless approved / recommended by her psychiatric team. Naltrexone cost with GoodRx is $40 per 30 days and $70 per 90 days.   Follow Up Plan: not needed at this time  Henrene Pastor, PharmD Clinical Pharmacist [facility]  Population Health [phone]

## 2024-02-09 ENCOUNTER — Telehealth: Payer: Self-pay

## 2024-02-09 ENCOUNTER — Ambulatory Visit (INDEPENDENT_AMBULATORY_CARE_PROVIDER_SITE_OTHER): Payer: BC Managed Care – PPO | Admitting: Physician Assistant

## 2024-02-09 NOTE — Addendum Note (Signed)
 Addended by: Donzetta Starch on: 02/09/2024 11:34 AM   Modules accepted: Orders

## 2024-02-09 NOTE — Telephone Encounter (Signed)
 Copied from CRM [id number]. Topic: Referral - Question >> Feb 09, 2024 10:39 AM Fonda Kinder J wrote: Reason for CRM: Exact scences state the ICD code on the referral order is incorrect and they are unable to perform the cologaurd. She wants to know how the pt's PCP will like to proceed? Callback # [phone]  Case # [id number]   Called and provided the correct diagnosis code for cologuard and also changed the diagnosis code in patient's chart. Donzetta Starch, CMA

## 2024-02-29 ENCOUNTER — Encounter (INDEPENDENT_AMBULATORY_CARE_PROVIDER_SITE_OTHER): Payer: Self-pay | Admitting: Physician Assistant

## 2024-02-29 ENCOUNTER — Ambulatory Visit (INDEPENDENT_AMBULATORY_CARE_PROVIDER_SITE_OTHER): Payer: BC Managed Care – PPO | Admitting: Physician Assistant

## 2024-02-29 VITALS — BP 116/61 | HR 107 | Temp 98.2°F | Ht 65.0 in | Wt 206.0 lb

## 2024-02-29 DIAGNOSIS — E559 Vitamin D deficiency, unspecified: Secondary | ICD-10-CM

## 2024-02-29 DIAGNOSIS — G4733 Obstructive sleep apnea (adult) (pediatric): Secondary | ICD-10-CM

## 2024-02-29 DIAGNOSIS — E88819 Insulin resistance, unspecified: Secondary | ICD-10-CM | POA: Diagnosis not present

## 2024-02-29 DIAGNOSIS — E669 Obesity, unspecified: Secondary | ICD-10-CM

## 2024-02-29 DIAGNOSIS — Z6834 Body mass index (BMI) 34.0-34.9, adult: Secondary | ICD-10-CM

## 2024-02-29 NOTE — Progress Notes (Signed)
 SUBJECTIVE: Discussed the use of AI scribe software for clinical note transcription with the patient, who gave verbal consent to proceed.  Chief Complaint: Obesity  Interim History: She is up 12 lbs since her last visit in 08/2023   She is wanting to get back on track with her plan.   Angelica Weeks is here to discuss her progress with her obesity treatment plan. She is on the Category 3 Plan and states she is not following her eating plan approximately 0 % of the time. She states she is not exercising 0 minutes 0 times per week. Walking the dog some,    Angelica Weeks is a 53 year old female who presents for follow-up of her obesity treatment plan.  She has not been seen since September of last year and is looking to reestablish care. She has experienced an increase in muscle mass, going from 102.6 to 105.2, which she acknowledges is beneficial for calorie burning. She was previously on a category three meal plan and is considering adjusting to a category two plan to promote gradual weight loss. She does not feel overly full on the category three plan and is open to trying category two.  She is currently taking metformin 1000 mg once a day without any issues. Her last vitamin D level was checked in September and was at a good level, though it had been high previously. She is not on any lipid-lowering medications and her recent labs, including cholesterol, electrolytes, liver and kidney function, and A1c, were all within normal limits.  She feels tired despite taking Adderall three times a day and has a history of sleep apnea but cannot afford a CPAP machine. This condition may contribute to her daytime fatigue.  She has recently stopped smoking, approximately 74 days ago, and is dealing with the associated weight gain. She works for a Engineer, mining but states that the presence of cookies does not influence her eating habits. She has not been walking her dog due to poor weather and lack of  motivation but anticipates being more active as the weather improves. She finds it difficult to get up from the floor due to a lack of strength in her upper legs and is interested in working on strengthening exercises.  OBJECTIVE: Visit Diagnoses: Problem List Items Addressed This Visit     Insulin resistance - Primary   Vitamin D deficiency   OSA (obstructive sleep apnea)   Obesity, Beginning BMI 36.94   Other Visit Diagnoses       BMI 34.0-34.9,adult Current BMI 34.3         Obesity She has gained weight since her last visit in September, despite increased muscle mass. Perimenopausal status and recent smoking cessation may contribute to weight gain.  She struggles with protein intake due to a dislike of meat and has difficulty with physical activity due to low energy and lack of strength. - Switch to category two meal plan to create a 400-500 calorie daily deficit for gradual weight loss - Increase protein intake with alternative sources such as beans, cheese, nuts, yogurt, chicken, tuna, and salmon - Engage in regular physical activity, including walking and strength exercises - Avoid keeping sweets in the house to reduce temptation - Use sugarless hard candy to manage oral fixation from quitting smoking  Insulin Resistance She is on metformin 1000 mg once daily with an A1c of 5.3, indicating well-managed insulin resistance. Lab Results  Component Value Date   HGBA1C 5.3 01/25/2024  HGBA1C 5.1 09/02/2023   HGBA1C 5.3 01/22/2023   Lab Results  Component Value Date   MICROALBUR 0.9 01/22/2023   LDLCALC 58 01/25/2024   CREATININE 0.71 01/25/2024   INSULIN  Date Value Ref Range Status  07/01/2022 9.2 2.6 - 24.9 uIU/mL Final  ] - Continue metformin 1000 mg once daily Resume working on nutrition plan to decrease simple carbohydrates, increase lean proteins and exercise to promote weight loss, improve glycemic control and prevent progression to Type 2 diabetes.   Vitamin D  Deficiency Vitamin D is at goal of 50.  Most recent vitamin D level was 57.1. She is on no vitamin D supplementation as level was very elevated previously. Lab Results  Component Value Date   VD25OH 57.1 09/02/2023   VD25OH 114.0 (H) 04/13/2023   VD25OH 122 (H) 01/22/2023    Plan:  Recheck vitamin D level next visit.  Low vitamin D levels can be associated with adiposity and may result in leptin resistance and weight gain. Also associated with fatigue.  Currently on vitamin D supplementation without any adverse effects such as nausea, vomiting or muscle weakness.     Sleep Apnea She experiences daytime tiredness, likely exacerbated by sleep apnea, and cannot afford a CPAP machine. Weight loss may alleviate symptoms. ?resources for patients who cannot afford CPAP.  - Encourage weight loss to potentially improve sleep apnea symptoms  General Health Maintenance Recent lab work, including cholesterol, electrolytes, liver and kidney function, and complete blood count, are within normal limits. Vitamin D level was previously high but is now within normal range. Lipids are normal, and she is not on lipid-lowering medications. - Recheck vitamin D levels at next appointment if necessary  Vitals Temp: 98.2 F (36.8 C) BP: 116/61 Pulse Rate: (!) 107 SpO2: 97 %   Anthropometric Measurements Height: 5\' 5"  (1.651 m) Weight: 206 lb (93.4 kg) BMI (Calculated): 34.28 Weight at Last Visit: 194 lb Weight Lost Since Last Visit: 0 Weight Gained Since Last Visit: 12 lb Starting Weight: 222 Total Weight Loss (lbs): 16 lb (7.258 kg)   Body Composition  Body Fat %: 46.3 % Fat Mass (lbs): 95.4 lbs Muscle Mass (lbs): 105.2 lbs Total Body Water (lbs): 74.6 lbs Visceral Fat Rating : 12   Other Clinical Data Fasting: no Labs: no Today's Visit #: 12 Starting Date: 05/28/22     ASSESSMENT AND PLAN:  Diet: Angelica Weeks is currently in the action stage of change. As such, her goal is to  continue with weight loss efforts and has agreed to the Category 2 Plan.   Exercise:  All adults should avoid inactivity. Some activity is better than none, and adults who participate in any amount of physical activity, gain some health benefits. and For substantial health benefits, adults should do at least 150 minutes (2 hours and 30 minutes) a week of moderate-intensity, or 75 minutes (1 hour and 15 minutes) a week of vigorous-intensity aerobic physical activity, or an equivalent combination of moderate- and vigorous-intensity aerobic activity. Aerobic activity should be performed in episodes of at least 10 minutes, and preferably, it should be spread throughout the week.  Behavior Modification:  We discussed the following Behavioral Modification Strategies today: increasing lean protein intake, decreasing simple carbohydrates, increasing vegetables, increase H2O intake, increase high fiber foods, meal planning and cooking strategies, avoiding temptations, and planning for success. We discussed various medication options to help Angelica Weeks with her weight loss efforts and we both agreed to continue to work on nutritional and behavioral strategies  to promote weight loss.  She will continue her usual medications.  Return in about 4 weeks (around 03/28/2024).Marland Kitchen She was informed of the importance of frequent follow up visits to maximize her success with intensive lifestyle modifications for her multiple health conditions.  Attestation Statements:   Reviewed by clinician on day of visit: allergies, medications, problem list, medical history, surgical history, family history, social history, and previous encounter notes.   Time spent on visit including pre-visit chart review and post-visit care and charting was 27 minutes  Marieme Mcmackin,PA-C

## 2024-03-27 NOTE — Progress Notes (Unsigned)
   SUBJECTIVE: Discussed the use of AI scribe software for clinical note transcription with the patient, who gave verbal consent to proceed.  Chief Complaint: Obesity  Interim History: ***  Angelica Weeks is here to discuss her progress with her obesity treatment plan. She is on the {HWW Weight Loss Plan:210964005} and states she {CHL AMB IS/IS NOT:210130109} following her eating plan approximately *** % of the time. She states she {CHL AMB IS/IS NOT:210130109} exercising *** minutes *** times per week.   OBJECTIVE: Visit Diagnoses: Problem List Items Addressed This Visit     Insulin resistance - Primary   Vitamin D deficiency   OSA (obstructive sleep apnea)   Obesity, Beginning BMI 36.94    No data recorded No data recorded No data recorded No data recorded   ASSESSMENT AND PLAN:  Diet: Angelica Weeks {CHL AMB IS/IS NOT:210130109} currently in the action stage of change. As such, her goal is to {HWW Weight Loss Efforts:210964006}. She {HAS HAS ZOX:09604} agreed to {HWW Weight Loss Plan:210964005}.  Exercise: Angelica Weeks has been instructed {HWW Exercise:210964007} for weight loss and overall health benefits.   Behavior Modification:  We discussed the following Behavioral Modification Strategies today: {HWW Behavior Modification:210964008}. We discussed various medication options to help Angelica Weeks with her weight loss efforts and we both agreed to ***.  No follow-ups on file.Aaron Aas She was informed of the importance of frequent follow up visits to maximize her success with intensive lifestyle modifications for her multiple health conditions.  Attestation Statements:   Reviewed by clinician on day of visit: allergies, medications, problem list, medical history, surgical history, family history, social history, and previous encounter notes.   Time spent on visit including pre-visit chart review and post-visit care and charting was *** minutes.    Adley Castello, PA-C

## 2024-03-28 ENCOUNTER — Ambulatory Visit (INDEPENDENT_AMBULATORY_CARE_PROVIDER_SITE_OTHER): Payer: BC Managed Care – PPO | Admitting: Physician Assistant

## 2024-03-28 DIAGNOSIS — G4733 Obstructive sleep apnea (adult) (pediatric): Secondary | ICD-10-CM

## 2024-03-28 DIAGNOSIS — E669 Obesity, unspecified: Secondary | ICD-10-CM

## 2024-03-28 DIAGNOSIS — E88819 Insulin resistance, unspecified: Secondary | ICD-10-CM

## 2024-03-28 DIAGNOSIS — E559 Vitamin D deficiency, unspecified: Secondary | ICD-10-CM

## 2024-05-01 NOTE — Progress Notes (Deleted)
   SUBJECTIVE: Discussed the use of AI scribe software for clinical note transcription with the patient, who gave verbal consent to proceed.  Chief Complaint: Obesity  Interim History: ***  Trish is here to discuss her progress with her obesity treatment plan. She is on the {HWW Weight Loss Plan:210964005} and states she {CHL AMB IS/IS NOT:210130109} following her eating plan approximately *** % of the time. She states she {CHL AMB IS/IS NOT:210130109} exercising *** minutes *** times per week.   OBJECTIVE: Visit Diagnoses: Problem List Items Addressed This Visit     Insulin resistance - Primary   Vitamin D deficiency   OSA (obstructive sleep apnea)   Obesity, Beginning BMI 36.94    No data recorded No data recorded No data recorded No data recorded   ASSESSMENT AND PLAN:  Diet: Tyeisha {CHL AMB IS/IS NOT:210130109} currently in the action stage of change. As such, her goal is to {HWW Weight Loss Efforts:210964006}. She {HAS HAS ZOX:09604} agreed to {HWW Weight Loss Plan:210964005}.  Exercise: Takia has been instructed {HWW Exercise:210964007} for weight loss and overall health benefits.   Behavior Modification:  We discussed the following Behavioral Modification Strategies today: {HWW Behavior Modification:210964008}. We discussed various medication options to help Kayin with her weight loss efforts and we both agreed to ***.  No follow-ups on file.Aaron Aas She was informed of the importance of frequent follow up visits to maximize her success with intensive lifestyle modifications for her multiple health conditions.  Attestation Statements:   Reviewed by clinician on day of visit: allergies, medications, problem list, medical history, surgical history, family history, social history, and previous encounter notes.   Time spent on visit including pre-visit chart review and post-visit care and charting was *** minutes.    Adley Castello, PA-C

## 2024-05-02 ENCOUNTER — Ambulatory Visit (INDEPENDENT_AMBULATORY_CARE_PROVIDER_SITE_OTHER): Admitting: Physician Assistant

## 2024-05-02 ENCOUNTER — Ambulatory Visit (INDEPENDENT_AMBULATORY_CARE_PROVIDER_SITE_OTHER): Admitting: Adult Health

## 2024-05-02 ENCOUNTER — Encounter (INDEPENDENT_AMBULATORY_CARE_PROVIDER_SITE_OTHER): Payer: Self-pay | Admitting: Adult Health

## 2024-05-02 VITALS — BP 117/80 | HR 119 | Temp 98.4°F | Ht 65.0 in | Wt 215.0 lb

## 2024-05-02 DIAGNOSIS — E559 Vitamin D deficiency, unspecified: Secondary | ICD-10-CM | POA: Diagnosis not present

## 2024-05-02 DIAGNOSIS — E88819 Insulin resistance, unspecified: Secondary | ICD-10-CM | POA: Diagnosis not present

## 2024-05-02 DIAGNOSIS — G4733 Obstructive sleep apnea (adult) (pediatric): Secondary | ICD-10-CM

## 2024-05-02 DIAGNOSIS — Z6835 Body mass index (BMI) 35.0-35.9, adult: Secondary | ICD-10-CM

## 2024-05-02 DIAGNOSIS — E669 Obesity, unspecified: Secondary | ICD-10-CM | POA: Diagnosis not present

## 2024-05-02 DIAGNOSIS — Z87891 Personal history of nicotine dependence: Secondary | ICD-10-CM

## 2024-05-02 DIAGNOSIS — E538 Deficiency of other specified B group vitamins: Secondary | ICD-10-CM

## 2024-05-02 DIAGNOSIS — Z6834 Body mass index (BMI) 34.0-34.9, adult: Secondary | ICD-10-CM

## 2024-05-02 NOTE — Progress Notes (Signed)
 WEIGHT SUMMARY AND BIOMETRICS  Vitals Temp: 98.4 F (36.9 C) BP: 117/80 Pulse Rate: (!) 119 SpO2: 99 %   Anthropometric Measurements Height: 5\' 5"  (1.651 m) Weight: 215 lb (97.5 kg) BMI (Calculated): 35.78 Weight at Last Visit: 206 lb Weight Lost Since Last Visit: 0 Weight Gained Since Last Visit: 9 lb Starting Weight: 222 lb Total Weight Loss (lbs): 7 lb (3.175 kg)   Body Composition  Body Fat %: 47.4 % Fat Mass (lbs): 102 lbs Muscle Mass (lbs): 107.6 lbs Total Body Water (lbs): 76.6 lbs Visceral Fat Rating : 12   Other Clinical Data Fasting: No Labs: No Today's Visit #: 13 Starting Date: 05/28/22    Chief Complaint:   OBESITY Angelica Weeks is here to discuss her progress with her obesity treatment plan.  She is on the the Category 2 Plan and states she is following her eating plan approximately 0 % of the time.  She states she is exercising: None  Interim History:  PCP Annual Wellness OV on 01/25/2024- reviewed notes/orders  Reviewed Bioimpedance Results with pt: Muscle Mass: +2.4 lbs Adipose Mass: + 6.6 lbs  Cravings- she endorses recent increased cravings, ie: candy  Stress- she lives with her long term partner of 20 years, his 46 son, and her 5 year old daughter. Her daughter has graduated from high school and works FT as a caregiver Her boyfriends son is a Consulting civil engineer at [employer]  He has been asked repeatedly to secure employment- this has been a source of frustration  Ms. Mickiewicz works in a Merchandiser, retail for [employer]. Her work schedule will often fluctuates from FT work to weeks off- dependent on product demand   Subjective:   1. Insulin  resistance She has been taking both Metformin  XR 500mg  at dinner- denies GI upset She continues to experience sugar cravings  2. Vitamin D  deficiency  Latest Reference Range & Units 01/22/23 16:05 04/13/23 15:33 09/02/23 15:12  Vitamin D , 25-Hydroxy 30.0 - 100.0 ng/mL 122 (H) 114.0 (H)  57.1  (H): Data is abnormally high  She has been off all Vit D supplementation for months  3. B12 deficiency  Latest Reference Range & Units 01/22/22 09:56 07/01/22 15:33 09/02/23 15:12  Vitamin B12 232 - 1245 pg/mL >2,000 (H) 1,946 (H) >2000 (H)  (H): Data is abnormally high She has been off oral B12 supplementation for months  4. Tobacco Hx > 20 year pack hx She has remained tobacco free since Jan 2025 PCP recently ordered LDCT due to pack hx She is not interested in completing study. Discussed the lung cancer screening criteria and that she would benefit from screening imaging.  Assessment/Plan:   1. Insulin  resistance (Primary) Resume Cat 2 MP Increase daily activity, walk "Marley" daily  2. Vitamin D  deficiency Check Labs - VITAMIN D  25 Hydroxy (Vit-D Deficiency, Fractures)  3. B12 deficiency Check Labs - Vitamin B12  4.  Tobacco Hx > 20 year pack hx Remain off tobacco Continue Chantix  per PCP Rec to complete the ordered LDCT  5. BMI 34.0-34.9,adult Current BMI 35.8  Angelica Weeks is not currently in the action stage of change. As such, her goal is to get back to weightloss efforts . She has agreed to the Category 2 Plan.   Exercise goals: All adults should avoid inactivity. Some physical activity is better than none, and adults who participate in any amount of physical activity gain some health benefits. Adults should also include muscle-strengthening activities that involve all major  muscle groups on 2 or more days a week. Walk her dog, "Marley" daily  Behavioral modification strategies: increasing lean protein intake, decreasing simple carbohydrates, increasing vegetables, increasing water intake, decreasing liquid calories, decreasing eating out, no skipping meals, meal planning and cooking strategies, keeping healthy foods in the home, ways to avoid boredom eating, ways to avoid night time snacking, better snacking choices, emotional eating strategies, planning for  success, and decreasing junk food.  Angelica Weeks has agreed to follow-up with our clinic in 4 weeks. She was informed of the importance of frequent follow-up visits to maximize her success with intensive lifestyle modifications for her multiple health conditions.   Angelica Weeks was informed we would discuss her lab results at her next visit unless there is a critical issue that needs to be addressed sooner. Angelica Weeks agreed to keep her next visit at the agreed upon time to discuss these results.  Objective:   Blood pressure 117/80, pulse (!) 119, temperature 98.4 F (36.9 C), height 5\' 5"  (1.651 m), weight 215 lb (97.5 kg), SpO2 99%. Body mass index is 35.78 kg/m.  General: Cooperative, alert, well developed, in no acute distress. HEENT: Conjunctivae and lids unremarkable. Cardiovascular: Regular rhythm.  Lungs: Normal work of breathing. Neurologic: No focal deficits.   Lab Results  Component Value Date   CREATININE 0.71 01/25/2024   BUN 11 01/25/2024   NA 141 01/25/2024   K 4.2 01/25/2024   CL 103 01/25/2024   CO2 29 01/25/2024   Lab Results  Component Value Date   ALT 26 01/25/2024   AST 26 01/25/2024   ALKPHOS 81 01/25/2024   BILITOT 0.2 01/25/2024   Lab Results  Component Value Date   HGBA1C 5.3 01/25/2024   HGBA1C 5.1 09/02/2023   HGBA1C 5.3 01/22/2023   HGBA1C 5.0 07/01/2022   HGBA1C 5.0 01/22/2022   Lab Results  Component Value Date   INSULIN  9.2 07/01/2022   Lab Results  Component Value Date   TSH 1.390 01/25/2024   Lab Results  Component Value Date   CHOL 155 01/25/2024   HDL 71.20 01/25/2024   LDLCALC 58 01/25/2024   TRIG 133.0 01/25/2024   CHOLHDL 2 01/25/2024   Lab Results  Component Value Date   VD25OH 57.1 09/02/2023   VD25OH 114.0 (H) 04/13/2023   VD25OH 122 (H) 01/22/2023   Lab Results  Component Value Date   WBC 4.3 01/25/2024   HGB 12.9 01/25/2024   HCT 38.9 01/25/2024   MCV 95.7 01/25/2024   PLT 236.0 01/25/2024   Lab Results  Component  Value Date   IRON 92 01/22/2021   TIBC 391 01/22/2021   Attestation Statements:   Reviewed by clinician on day of visit: allergies, medications, problem list, medical history, surgical history, family history, social history, and previous encounter notes.  I have reviewed the above documentation for accuracy and completeness, and I agree with the above. -  Carynn Felling d. Dulcie Gammon, NP-C

## 2024-05-03 LAB — VITAMIN B12: Vitamin B-12: 1312 pg/mL — ABNORMAL HIGH (ref 232–1245)

## 2024-05-03 LAB — VITAMIN D 25 HYDROXY (VIT D DEFICIENCY, FRACTURES): Vit D, 25-Hydroxy: 46.2 ng/mL (ref 30.0–100.0)

## 2024-05-10 ENCOUNTER — Encounter (INDEPENDENT_AMBULATORY_CARE_PROVIDER_SITE_OTHER): Payer: Self-pay | Admitting: Adult Health

## 2024-05-11 ENCOUNTER — Encounter (INDEPENDENT_AMBULATORY_CARE_PROVIDER_SITE_OTHER): Payer: Self-pay | Admitting: Adult Health

## 2024-05-18 ENCOUNTER — Other Ambulatory Visit: Payer: Self-pay | Admitting: Internal Medicine

## 2024-05-18 DIAGNOSIS — R110 Nausea: Secondary | ICD-10-CM

## 2024-05-23 ENCOUNTER — Ambulatory Visit: Payer: Self-pay

## 2024-05-23 ENCOUNTER — Encounter: Payer: Self-pay | Admitting: Internal Medicine

## 2024-05-23 ENCOUNTER — Ambulatory Visit

## 2024-05-23 ENCOUNTER — Ambulatory Visit (INDEPENDENT_AMBULATORY_CARE_PROVIDER_SITE_OTHER): Admitting: Internal Medicine

## 2024-05-23 ENCOUNTER — Telehealth: Payer: Self-pay

## 2024-05-23 VITALS — BP 138/78 | HR 122 | Temp 98.0°F | Resp 16 | Ht 65.0 in | Wt 215.8 lb

## 2024-05-23 DIAGNOSIS — M5136 Other intervertebral disc degeneration, lumbar region with discogenic back pain only: Secondary | ICD-10-CM

## 2024-05-23 MED ORDER — KETOROLAC TROMETHAMINE 60 MG/2ML IM SOLN
60.0000 mg | Freq: Once | INTRAMUSCULAR | Status: AC
  Administered 2024-05-23: 60 mg via INTRAMUSCULAR

## 2024-05-23 MED ORDER — CYCLOBENZAPRINE HCL 10 MG PO TABS: 10.0000 mg | ORAL_TABLET | Freq: Three times a day (TID) | ORAL | 0 refills | Status: DC | PRN

## 2024-05-23 MED ORDER — KETOROLAC TROMETHAMINE 10 MG PO TABS: 10.0000 mg | ORAL_TABLET | Freq: Four times a day (QID) | ORAL | 0 refills | Status: DC | PRN

## 2024-05-23 MED ORDER — TRAMADOL HCL 50 MG PO TABS
50.0000 mg | ORAL_TABLET | Freq: Three times a day (TID) | ORAL | 0 refills | Status: DC | PRN
Start: 2024-05-23 — End: 2024-07-20
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
## 2024-05-23 NOTE — Telephone Encounter (Signed)
 FYI Only or Action Required?: FYI only for provider  Patient was last seen in primary care on 05/02/2024 by Margie Sheller, NP. Called Nurse Triage reporting No chief complaint on file.. Symptoms began several days ago. Interventions attempted: OTC medications: IBU. Symptoms are: unchanged.  Triage Disposition: No disposition on file.  Patient/caregiver understands and will follow disposition?:  Reason for Disposition  [1] SEVERE back pain (e.g., excruciating, unable to do any normal activities) AND [2] not improved 2 hours after pain medicine  Answer Assessment - Initial Assessment Questions 1. ONSET: "When did the pain begin?"      2 days - reached down to pick up something 2. LOCATION: "Where does it hurt?" (upper, mid or lower back)     Close to spine 3. SEVERITY: "How bad is the pain?"  (e.g., Scale 1-10; mild, moderate, or severe)   - MILD (1-3): Doesn't interfere with normal activities.    - MODERATE (4-7): Interferes with normal activities or awakens from sleep.    - SEVERE (8-10): Excruciating pain, unable to do any normal activities.      severe 4. PATTERN: "Is the pain constant?" (e.g., yes, no; constant, intermittent)      constant 5. RADIATION: "Does the pain shoot into your legs or somewhere else?"     no 6. CAUSE:  "What do you think is causing the back pain?"      Disk  7. BACK OVERUSE:  "Any recent lifting of heavy objects, strenuous work or exercise?"     Bent over 8. MEDICINES: "What have you taken so far for the pain?" (e.g., nothing, acetaminophen , NSAIDS)     IBU 9. NEUROLOGIC SYMPTOMS: "Do you have any weakness, numbness, or problems with bowel/bladder control?"     no 10. OTHER SYMPTOMS: "Do you have any other symptoms?" (e.g., fever, abdomen pain, burning with urination, blood in urine)       Can't bend over  Protocols used: Back Pain-A-AH

## 2024-05-23 NOTE — Telephone Encounter (Signed)
 Sent to provider to resend  Copied from CRM [id number]. Topic: Clinical - Prescription Issue >> May 23, 2024  3:51 PM Deaijah H wrote: Reason for CRM: Patient called in stating pharmacy did not receive prescription ketorolac  (TORADOL ) 10 MG tablet

## 2024-05-23 NOTE — Telephone Encounter (Signed)
 Noted pt is in office now for appt

## 2024-05-23 NOTE — Patient Instructions (Addendum)
 RED FLAG WARNING SIGNS - GO TO EMERGENCY ROOM IF YOU DEVELOP: New weakness or numbness in legs Loss of bladder or bowel control Saddle anesthesia (numbness in groin/rectal area) Severe, progressive, or disabling pain despite treatment Fever over 101F with back pain Sudden onset of severe tearing/ripping pain  VISIT SUMMARY:  Today, we discussed your recent lower back pain, which started after bending over a few days ago. We reviewed your history of degenerative disc disease and examined your back to determine the cause of your pain.  YOUR PLAN:  -LUMBAR DEGENERATIVE DISC DISEASE (DDD) WITH SUSPECTED SLIPPED DISC: Degenerative disc disease is a condition where the discs between your vertebrae break down, which can cause pain. Your recent pain is likely due to a slipped disc, which is when a disc moves out of place and pinches a nerve. We recommend physical therapy to help realign your spine and prevent future issues. You should rest, use ice packs initially and then switch to heat, and take acetaminophen  and ibuprofen  for pain relief. We are prescribing muscle relaxants to help with muscle spasms. Avoid heavy lifting for two weeks and practice proper body mechanics when lifting. We will do a lumbar spine x-ray to check for alignment issues and refer you to physical therapy. We also provided a handout with exercises for back disc rehabilitation. For acute pain, we prescribed Toradol  and discussed the potential use of Tramadol, being cautious of its interaction with alprazolam .  -GENERAL HEALTH MAINTENANCE: You have successfully quit smoking, which is excellent for your overall health and helps reduce risks related to degenerative disc disease and other conditions. Continue to abstain from smoking to support your health.  INSTRUCTIONS:  Please follow up with the lumbar spine x-ray as ordered and start physical therapy as soon as possible. Avoid heavy lifting for the next two weeks and use proper  body mechanics when lifting. If your pain does not improve or worsens, please contact our office.  It was a pleasure seeing you today! Your health and satisfaction are our top priorities.  Scherrie Curt, MD  Your Providers PCP: Anthon Kins, MD,  [phone]) Referring Provider: Anthon Kins, MD,  [phone]) Care Team Provider: Devin Foerster, MD,  [phone]) Care Team Provider: Dyanna Glasgow, [place],  [phone]) Care Team Provider: Larance Plater, MD,  [phone]) Care Team Provider: Lillian Rein, MD,  [phone])     NEXT STEPS: [x]  Early Intervention: Schedule sooner appointment, call our on-call services, or go to emergency room if there is any significant Increase in pain or discomfort New or worsening symptoms Sudden or severe changes in your health [x]  Flexible Follow-Up: We recommend a No follow-ups on file. for optimal routine care. This allows for progress monitoring and treatment adjustments. [x]  Preventive Care: Schedule your annual preventive care visit! It's typically covered by insurance and helps identify potential health issues early. [x]  Lab & X-ray Appointments: Incomplete tests scheduled today, or call to schedule. X-rays: [facility] (M-F, 8:30am-noon or 1pm-5pm). [x]  Medical Information Release: Sign a release form at front desk to obtain relevant medical information we don't have.  MAKING THE MOST OF OUR FOCUSED 20 MINUTE APPOINTMENTS: [x]   Clearly state your top concerns at the beginning of the visit to focus our discussion [x]   If you anticipate you will need more time, please inform the front desk during scheduling - we can book multiple appointments in the same week. [x]   If you have transportation problems- use our convenient video appointments or ask  about transportation support. [x]   We can get down to business faster if you use MyChart to update information before the visit and submit non-urgent questions  before your visit. Thank you for taking the time to provide details through MyChart.  Let our nurse know and she can import this information into your encounter documents.  Arrival and Wait Times: [x]   Arriving on time ensures that everyone receives prompt attention. [x]   Early morning (8a) and afternoon (1p) appointments tend to have shortest wait times. [x]   Unfortunately, we cannot delay appointments for late arrivals or hold slots during phone calls.  Getting Answers and Following Up [x]   Simple Questions & Concerns: For quick questions or basic follow-up after your visit, reach us  at [phone] or MyChart messaging. [x]   Complex Concerns: If your concern is more complex, scheduling an appointment might be best. Discuss this with the staff to find the most suitable option. [x]   Lab & Imaging Results: We'll contact you directly if results are abnormal or you don't use MyChart. Most normal results will be on MyChart within 2-3 business days, with a review message from Dr. Boston Byers. Haven't heard back in 2 weeks? Need results sooner? Contact us  at [phone]. [x]   Referrals: Our referral coordinator will manage specialist referrals. The specialist's office should contact you within 2 weeks to schedule an appointment. Call us  if you haven't heard from them after 2 weeks.  Staying Connected [x]   MyChart: Activate your MyChart for the fastest way to access results and message us . See the last page of this paperwork for instructions on how to activate.  Bring to Your Next Appointment [x]   Medications: Please bring all your medication bottles to your next appointment to ensure we have an accurate record of your prescriptions. [x]   Health Diaries: If you're monitoring any health conditions at home, keeping a diary of your readings can be very helpful for discussions at your next appointment.  Billing [x]   X-ray & Lab Orders: These are billed by separate companies. Contact the invoicing  company directly for questions or concerns. [x]   Visit Charges: Discuss any billing inquiries with our administrative services team.  Your Satisfaction Matters [x]   Share Your Experience: We strive for your satisfaction! If you have any complaints, or preferably compliments, please let Dr. Boston Byers know directly or contact our Practice Administrators, Olinda Bertrand or [employer], by asking at the front desk.   Reviewing Your Records [x]   Review this early draft of your clinical encounter notes below and the final encounter summary tomorrow on MyChart after its been completed.  All orders placed so far are visible here: Degeneration of intervertebral disc of lumbar region with discogenic back pain -     Cyclobenzaprine  HCl; Take 1 tablet (10 mg total) by mouth 3 (three) times daily as needed for muscle spasms.  Dispense: 30 tablet; Refill: 0 -     Ambulatory referral to Physical Therapy -     DG Lumbar Spine Complete; Future -     traMADol HCl; Take 1 tablet (50 mg total) by mouth every 8 (eight) hours as needed for up to 5 days.  Dispense: 15 tablet; Refill: 0 -     Ketorolac  Tromethamine ; Take 1 tablet (10 mg total) by mouth every 6 (six) hours as needed.  Dispense: 20 tablet; Refill: 0 -     Ketorolac  Tromethamine 

## 2024-05-24 MED ORDER — KETOROLAC TROMETHAMINE 10 MG PO TABS: 10.0000 mg | ORAL_TABLET | Freq: Four times a day (QID) | ORAL | 0 refills | Status: DC | PRN

## 2024-05-26 ENCOUNTER — Other Ambulatory Visit: Payer: Self-pay | Admitting: Internal Medicine

## 2024-05-26 DIAGNOSIS — F17200 Nicotine dependence, unspecified, uncomplicated: Secondary | ICD-10-CM

## 2024-05-27 ENCOUNTER — Ambulatory Visit: Payer: Self-pay | Admitting: Internal Medicine

## 2024-05-27 ENCOUNTER — Other Ambulatory Visit (HOSPITAL_COMMUNITY): Payer: Self-pay

## 2024-05-27 ENCOUNTER — Telehealth: Payer: Self-pay

## 2024-05-27 NOTE — Telephone Encounter (Signed)
 Pharmacy Patient Advocate Encounter   Received notification from CoverMyMeds that prior authorization for Tretinoin  0.1% cream is required/requested.   Insurance verification completed.   The patient is insured through [employer] .   Per test claim: PA required and submitted KEY/EOC/Request #: [id number] from 05/27/24 to 05/27/25. Ran test claim, Copay is $0. This test claim was processed through [facility]- copay amounts may vary at other pharmacies due to pharmacy/plan contracts, or as the patient moves through the different stages of their insurance plan.

## 2024-05-29 ENCOUNTER — Other Ambulatory Visit: Payer: Self-pay | Admitting: Internal Medicine

## 2024-05-29 DIAGNOSIS — M5136 Other intervertebral disc degeneration, lumbar region with discogenic back pain only: Secondary | ICD-10-CM
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
## 2024-06-01 ENCOUNTER — Ambulatory Visit: Attending: Internal Medicine

## 2024-06-01 DIAGNOSIS — M6281 Muscle weakness (generalized): Secondary | ICD-10-CM | POA: Insufficient documentation

## 2024-06-01 DIAGNOSIS — M5136 Other intervertebral disc degeneration, lumbar region with discogenic back pain only: Secondary | ICD-10-CM | POA: Diagnosis not present

## 2024-06-01 DIAGNOSIS — M5459 Other low back pain: Secondary | ICD-10-CM | POA: Insufficient documentation

## 2024-06-08 ENCOUNTER — Ambulatory Visit (INDEPENDENT_AMBULATORY_CARE_PROVIDER_SITE_OTHER): Admitting: Family Medicine

## 2024-06-10 ENCOUNTER — Ambulatory Visit: Admitting: Internal Medicine

## 2024-06-14 ENCOUNTER — Encounter (INDEPENDENT_AMBULATORY_CARE_PROVIDER_SITE_OTHER): Payer: Self-pay | Admitting: Family Medicine

## 2024-06-14 ENCOUNTER — Ambulatory Visit (INDEPENDENT_AMBULATORY_CARE_PROVIDER_SITE_OTHER): Admitting: Family Medicine

## 2024-06-14 VITALS — BP 119/81 | HR 97 | Temp 98.7°F | Ht 65.0 in | Wt 211.0 lb

## 2024-06-14 DIAGNOSIS — E559 Vitamin D deficiency, unspecified: Secondary | ICD-10-CM | POA: Diagnosis not present

## 2024-06-14 DIAGNOSIS — E538 Deficiency of other specified B group vitamins: Secondary | ICD-10-CM

## 2024-06-14 DIAGNOSIS — E88819 Insulin resistance, unspecified: Secondary | ICD-10-CM

## 2024-06-14 DIAGNOSIS — E669 Obesity, unspecified: Secondary | ICD-10-CM

## 2024-06-14 DIAGNOSIS — Z6835 Body mass index (BMI) 35.0-35.9, adult: Secondary | ICD-10-CM

## 2024-06-14 NOTE — Progress Notes (Signed)
 Angelica Weeks, D.O.  ABFM, ABOM Specializing in Clinical Bariatric Medicine  Office located at: [address] [place], [place]  [address]   Assessment and Plan:   FOR THE DISEASE OF OBESITY: Obesity, Beginning BMI 36.94 BMI 35.0-35.9,adult current 35.11 Assessment & Plan: This patient is established with Angelica Dalton, NP. This is my first encounter with this patient.  Since last office visit with Angelica on 5/19 patient's muscle mass has increased by 6.8 lbs. Fat mass has decreased by 10.6 lbs. Total body water has decreased by 1.6 lbs.  Counseling done on how various foods will affect these numbers and how to maximize success  Total lbs lost to date: 11 lbs Total weight loss percentage to date: 4.95%    Recommended Dietary Goals Angelica Weeks is currently in the action stage of change. As such, her goal is to continue weight management plan. I reviewed the benefits of journaling for improved mindful eating. Pt declined.   She has agreed to: continue current plan   Behavioral Intervention We discussed the following today: increasing lean protein intake to established goals, decreasing simple carbohydrates , avoiding skipping meals, keeping healthy foods at home, identifying sources and decreasing liquid calories, and practice mindfulness eating and understand the difference between hunger signals and cravings. Spreading out protein intake throughout the day with high-protein snacks btwn meals. Eat all must eats before other off-plan foods. Benefits of measuring and journaling for mindful eating.   Additional resources provided today: None  Evidence-based interventions for health behavior change were utilized today including the discussion of self monitoring techniques, problem-solving barriers and SMART goal setting techniques.   Regarding patient's less desirable eating habits and patterns, we employed the technique of small changes.   Pt will specifically work on: meet her  protein goals per meal     Recommended Physical Activity Goals Angelica Weeks has been advised to work up to 300-450 minutes of moderate intensity aerobic activity a week and strengthening exercises 2-3 times per week for cardiovascular health, weight loss maintenance and preservation of muscle mass.   She has agreed to: Continue current level of physical activity    Pharmacotherapy We both agreed to: Continue with current nutritional and behavioral strategies and Continue Metformin  for I.R.    ASSOCIATED CONDITIONS ADDRESSED TODAY:  Insulin  resistance Assessment & Plan: Lab Results  Component Value Date   HGBA1C 5.3 01/25/2024   HGBA1C 5.1 09/02/2023   HGBA1C 5.3 01/22/2023   INSULIN  9.2 07/01/2022    Compliant with Metformin  1,000 mg once daily. Denies any GI upset or other adverse side effects. Struggled with meeting her protein goals at each meal.   Advised pt to decrease simple cars/sugars and avoid fatty meats. Increase protein intake at each meal or spread out throughout the day. Encouraged measuring/journaling for mindful eating; pt declined. Continue current medication regimen as prescribed. Continue monitoring.     Vitamin D  deficiency Assessment & Plan: Lab Results  Component Value Date   VD25OH 46.2 05/02/2024   VD25OH 57.1 09/02/2023   VD25OH 114.0 (H) 04/13/2023   Pt on OTC vit D 2,000 IUs daily. Tolerating well, no SE. No acute concerns.   Continue supplementation. Will periodically recheck levels every 3-4 mo. from last checked.     B12 deficiency Assessment & Plan: Lab Results  Component Value Date   VITAMINB12 1,312 (H) 05/02/2024   VITAMINB12 >2000 (H) 09/02/2023   VITAMINB12 1,946 (H) 07/01/2022   Not currently on B12 supplementation for several months. Although her  B12 has improved since stopping B12 supplementation, from >2000 in 9/14 to 1312 in 04/2024, her B12 levels are still well above goal.   Ideal B12 goal of 500 reviewed with pt. Advised her  to stay off B12 supplementation until otherwise instructed. Recommend she stop drinking caloric beverages. Continue monitoring, will recheck in approx. 3-4 mo from last checked.     Follow up:   Return in about 6 weeks (around 07/26/2024) for 4-6 f/u with Angelica or first availabile provider. She was informed of the importance of frequent follow up visits to maximize her success with intensive lifestyle modifications for her multiple health conditions.   Subjective:   Chief complaint: Obesity [name] is here to discuss her progress with her obesity treatment plan. She is on the Category 2 Plan and states she is following her eating plan approximately 50% of the time. She states she is exercising 20 minutes 4 days per week.  Interval History:  HA Angelica Weeks is here for a follow up office visit. This patient is established with Angelica Dalton, NP. This is my first encounter with this patient. Since last OV with Katy on 05/02/24 , she has lost 4 lbs. She has struggled with meeting her protein goal per day, especially at dinner. Will eat chicken, [place], or seafood, but dislikes red meat. Usually has chicken sandwich for lunch. Pt not interested in journaling or measuring.   Pharmacotherapy that aid with weight loss: She is currently taking Metformin  1,000 mg once daily.   Review of Systems:  Pertinent positives were addressed with patient today.  Reviewed by clinician on day of visit: allergies, medications, problem list, medical history, surgical history, family history, social history, and previous encounter notes.  Weight Summary and Biometrics   Weight Lost Since Last Visit: 4lb  Weight Gained Since Last Visit: 0lb   Vitals Temp: 98.7 F (37.1 C) BP: 119/81 Pulse Rate: 97 SpO2: 98 %   Anthropometric Measurements Height: 5' 5 (1.651 m) Weight: 211 lb (95.7 kg) BMI (Calculated): 35.11 Weight at Last Visit: 215lb Weight Lost Since Last Visit: 4lb Weight Gained Since Last Visit:  0lb Starting Weight: 22lb Total Weight Loss (lbs): 11 lb (4.99 kg)   Body Composition  Body Fat %: 43.2 % Fat Mass (lbs): 91.4 lbs Muscle Mass (lbs): 114.4 lbs Total Body Water (lbs): 75 lbs Visceral Fat Rating : 11   Other Clinical Data Fasting: No Labs: No Today's Visit #: 14 Starting Date: 05/28/22    Objective:   PHYSICAL EXAM: Blood pressure 119/81, pulse 97, temperature 98.7 F (37.1 C), height 5' 5 (1.651 m), weight 211 lb (95.7 kg), SpO2 98%. Body mass index is 35.11 kg/m.  General: she is overweight, cooperative and in no acute distress. PSYCH: Has normal mood, affect and thought process.   HEENT: EOMI, sclerae are anicteric. Lungs: Normal breathing effort, no conversational dyspnea. Extremities: Moves * 4 Neurologic: A and O * 3, good insight  DIAGNOSTIC DATA REVIEWED: BMET    Component Value Date/Time   NA 141 01/25/2024 0953   NA 142 09/02/2023 1512   K 4.2 01/25/2024 0953   CL 103 01/25/2024 0953   CO2 29 01/25/2024 0953   GLUCOSE 88 01/25/2024 0953   BUN 11 01/25/2024 0953   BUN 14 09/02/2023 1512   CREATININE 0.71 01/25/2024 0953   CREATININE 0.88 01/22/2023 1605   CALCIUM 9.0 01/25/2024 0953   GFRNONAA 73 01/22/2021 0951   GFRAA 85 01/22/2021 0951   Lab Results  Component Value  Date   HGBA1C 5.3 01/25/2024   HGBA1C 5.6 02/13/2015   Lab Results  Component Value Date   INSULIN  9.2 07/01/2022   Lab Results  Component Value Date   TSH 1.390 01/25/2024   CBC    Component Value Date/Time   WBC 4.3 01/25/2024 0953   RBC 4.06 01/25/2024 0953   HGB 12.9 01/25/2024 0953   HGB 12.8 07/01/2022 1533   HCT 38.9 01/25/2024 0953   HCT 36.4 07/01/2022 1533   PLT 236.0 01/25/2024 0953   PLT 334 07/01/2022 1533   MCV 95.7 01/25/2024 0953   MCV 91 07/01/2022 1533   MCH 32.3 01/22/2023 1605   MCHC 33.1 01/25/2024 0953   RDW 13.5 01/25/2024 0953   RDW 14.1 07/01/2022 1533   Iron Studies    Component Value Date/Time   IRON 92  01/22/2021 0951   TIBC 391 01/22/2021 0951   IRONPCTSAT 24 01/22/2021 0951   Lipid Panel     Component Value Date/Time   CHOL 155 01/25/2024 0953   CHOL 169 07/01/2022 1533   TRIG 133.0 01/25/2024 0953   HDL 71.20 01/25/2024 0953   HDL 68 07/01/2022 1533   CHOLHDL 2 01/25/2024 0953   VLDL 26.6 01/25/2024 0953   LDLCALC 58 01/25/2024 0953   LDLCALC 61 01/22/2023 1605   Hepatic Function Panel     Component Value Date/Time   PROT 6.7 01/25/2024 0953   PROT 6.8 09/02/2023 1512   ALBUMIN 4.2 01/25/2024 0953   ALBUMIN 4.6 09/02/2023 1512   AST 26 01/25/2024 0953   ALT 26 01/25/2024 0953   ALKPHOS 81 01/25/2024 0953   BILITOT 0.2 01/25/2024 0953   BILITOT <0.2 09/02/2023 1512   BILIDIR 0.1 04/13/2017 1117   IBILI 0.1 (L) 04/13/2017 1117      Component Value Date/Time   TSH 1.390 01/25/2024 0953   TSH 1.18 01/22/2023 1605   Nutritional Lab Results  Component Value Date   VD25OH 46.2 05/02/2024   VD25OH 57.1 09/02/2023   VD25OH 114.0 (H) 04/13/2023    Attestations:   I, Vernell Forest, acting as a Stage manager for Angelica Jenkins, DO., have compiled all relevant documentation for today's office visit on behalf of Angelica Jenkins, DO, while in the presence of [employer], DO.  I have reviewed the above documentation for accuracy and completeness, and I agree with the above. Angelica JINNY Weeks, D.O.  The 21st Century Cures Act was signed into law in 2016 which includes the topic of electronic health records.  This provides immediate access to information in MyChart.  This includes consultation notes, operative notes, office notes, lab results and pathology reports.  If you have any questions about what you read please let us  know at your next visit so we can discuss your concerns and take corrective action if need be.  We are right here with you.

## 2024-06-20 ENCOUNTER — Telehealth: Payer: Self-pay

## 2024-06-20 ENCOUNTER — Ambulatory Visit: Attending: Internal Medicine

## 2024-06-20 NOTE — Telephone Encounter (Signed)
 TC due to missed visit.  VM left to inform patient no future visits scheduled and would need to call back to reschedule visit if needed.
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
## 2024-07-19 ENCOUNTER — Encounter: Payer: Self-pay | Admitting: Internal Medicine

## 2024-07-19 DIAGNOSIS — R61 Generalized hyperhidrosis: Secondary | ICD-10-CM

## 2024-07-20 ENCOUNTER — Telehealth: Admitting: Internal Medicine

## 2024-07-20 DIAGNOSIS — R61 Generalized hyperhidrosis: Secondary | ICD-10-CM | POA: Insufficient documentation

## 2024-07-20 MED ORDER — GLYCOPYRROLATE 1 MG PO TABS: 2.0000 mg | ORAL_TABLET | Freq: Two times a day (BID) | ORAL | 11 refills | Status: AC | PRN

## 2024-07-20 MED ORDER — NONFORMULARY OR COMPOUNDED ITEM: 11 refills | Status: AC

## 2024-07-20 MED ORDER — NONFORMULARY OR COMPOUNDED ITEM: 11 refills | Status: DC

## 2024-07-20 NOTE — Assessment & Plan Note (Signed)
 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 Oxybutynin  was effective previously but caused significant xerostomia, raising dental concerns. Glycopyrrolate  is considered due to a potentially lower risk of xerostomia. Side effects, including constipation and cognitive effects, were discussed, with caution advised for exacerbating any underlying constipation. Exploring topical treatments with a new cream option. Botox is a potential future treatment if needed. She prefers aggressive treatment due to daily symptoms impacting work. Prescribe glycopyrrolate  1-2 mg orally twice daily as needed, starting with half a pill to assess tolerance. Send prescription for glycopyrrolate  to [address] [facility] in [place]. Consider glycopyrronium bromide cream from a compounding pharmacy for facial application to minimize systemic side effects. Send prescription for glycopyrronium bromide cream to [facility] for compounding. Encourage trial of topical treatment if cost is reasonable to minimize side effects. Discuss Botox as a potential future treatment option if current treatments are ineffective.

## 2024-07-20 NOTE — Telephone Encounter (Signed)
 Pt is on for 320 VV today

## 2024-07-20 NOTE — Progress Notes (Addendum)
 ==============================  [facility]: [phone]   Virtual Medical Office Visit - Video Telemedicine   Patient:  Angelica Weeks ([birth year]) located at home MRN:   [id number]      Date:   07/26/2024  PCP:    Jesus Bernardino MATSU, MD   Today's Healthcare Provider: Bernardino MATSU Jesus, MD located at office: [facility] [address], [place] [address] Today's Telemedicine visit was conducted via Video after consent for telemedicine was obtained:  Video connection was never lost All video encounter participant identities and locations confirmed visually and verbally.   Chief Complaint: Chief Complaint: Excessive sweating of the face, longstandi Discussed the use of AI scribe software for clinical note transcription with the patient, who gave verbal consent to proceed.  History of Present Illness  53 year old female who presents with excessive facial sweating.  She experiences excessive sweating primarily on her face, describing it as 'pouring out of my face' and finds it 'so embarrassing'. 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 The sweating does not affect her armpits or chest.  She previously tried oxybutynin  at a dose of 10 mg, which was effective in managing her symptoms but caused significant dry mouth, leading to its discontinuation. She has not tried any topical treatments for her hyperhidrosis.  Her menopause status is uncertain, as she has not had a period in approximately six years, but she believes her sweating started before menopause. She is confident that her symptoms are not related to hot flashes.  No issues with bladder leakage, even when coughing or sneezing, and no constipation. She has not noticed any problems with her eyes, such as issues when transitioning between light and dark environments, and she has been screened for glaucoma.   Patient  presents via video visit for evaluation and management of hyperhidrosis, primarily affecting the face. Symptoms have been ongoing for several years. Sweating is triggered by multiple factors, including heat, stress/anxiety, exercise, and spicy foods; patient indicates all of the above as triggers. The impact on quality of life is described as intolerable-symptoms are always present and interfere significantly.  Patient has previously tried oral medication for sweating (pill form), but does not recall the name or dosage. Prior treatment was helpful, though it caused dry mouth as a side effect. No other treatments (clinical-strength antiperspirant, prescription antiperspirant, iontophoresis, or Botox) have been tried.  No history of glaucoma, urinary retention, severe constipation, bowel blockage, myasthenia gravis, or cognitive issues. No current use of nicotine/vape, caffeine energy drinks, or stimulants. No chance of pregnancy or breastfeeding. No recent fever, unintentional weight loss, night sweats, chest pain, palpitations, fainting, severe headache, or new neurologic symptoms. No thyroid  symptoms. No skin irritation or infections related to sweating.  Background Reviewed: Problem List: has ADD (attention deficit disorder); Bipolar 1 disorder, depressed (HCC); GERD (gastroesophageal reflux disease); BMI 32.0-32.9,adult; Elevated prolactin level; Insulin  resistance; Fatigue; Postmenopausal estrogen deficiency; Metabolic syndrome; Moderate recurrent major depression (HCC); Sleep disturbance; Daytime somnolence; Elevated fasting blood sugar; Vitamin D  deficiency; B12 deficiency; Hypersomnia, persistent; Fatigue due to depression; Insomnia due to other mental disorder; Nocturia; Snoring; Class 2 drug-induced obesity with serious comorbidity and body mass index (BMI) of 38.0 to 38.9 in adult; OSA (obstructive sleep apnea); Obesity, Beginning BMI 36.94; Smoking; Acne vulgaris; Osteoarthritis of right knee;  History of cannabis dependence/abuse (HCC); Inadequate material resources; and Hyperhidrosis Face on their problem list. Past Medical History:  has  a past medical history of ADD (attention deficit disorder), Anxiety, Bipolar 1 disorder, depressed (HCC), Cannabis abuse with psychotic disorder with delusions (HCC) (05/22/2018), Cellulitis of left eyelid (01/2014), Depression, Dysmenorrhea, History of sleep apnea, Paranoid delusion (HCC) (05/22/2018), Schizophrenia (HCC), Smoker unmotivated to quit (10/06/2018), and Vitamin D  deficiency. Past Surgical History:   has a past surgical history that includes Wisdom tooth extraction. Social History:   reports that she has been smoking cigarettes. She started smoking about 35 years ago. She has a 35.6 pack-year smoking history. She has been exposed to tobacco smoke. She has never used smokeless tobacco. She reports that she does not currently use alcohol. She reports that she does not currently use drugs after having used the following drugs: Marijuana, Amphetamines, and Benzodiazepines. Family History:  She was adopted. Family history is unknown by patient. Allergies:  is allergic to aspirin.   Medication Reconciliation: Current Outpatient Medications on File Prior to Visit  Medication Sig   ALPRAZolam  (XANAX ) 1 MG tablet Take 1 mg by mouth at bedtime as needed for anxiety.   amphetamine -dextroamphetamine  (ADDERALL) 20 MG tablet Take 1 tablet (20 mg total) by mouth 3 (three) times daily.   atomoxetine  (STRATTERA ) 60 MG capsule Take 60 mg by mouth daily.   celecoxib  (CELEBREX ) 200 MG capsule Take 1 capsule (200 mg total) by mouth daily. Do not take with ibuprofen  or aleve (naproxen).  Drink full glass of water with each dose   lamoTRIgine  (LAMICTAL ) 200 MG tablet Take 2 tablets (400 mg total) by mouth at bedtime.   metFORMIN  (GLUCOPHAGE -XR) 500 MG 24 hr tablet Always take with meals. To start: 1 tab daily for 1 week, then 1 tablet twice daily for 1 week, then 2  tablets twice daily afterwards, if tolerated.   ondansetron  (ZOFRAN ) 4 MG tablet TAKE 1 TABLET BY MOUTH 3 TIMES A DAY IF NEEDED FOR NAUSEA   trazodone  (DESYREL ) 300 MG tablet Take 300 mg by mouth at bedtime.   tretinoin  (RETIN-A ) 0.1 % cream Apply topically at bedtime.   VRAYLAR 6 MG CAPS TAKE 1 CAPSULE BY MOUTH DAILY   No current facility-administered medications on file prior to visit.   Medications Discontinued During This Encounter  Medication Reason   NONFORMULARY OR COMPOUNDED ITEM    cyclobenzaprine  (FLEXERIL ) 10 MG tablet Completed Course   traMADol  (ULTRAM ) 50 MG tablet Completed Course   ketorolac  (TORADOL ) 10 MG tablet Completed Course   varenicline  (CHANTIX ) 1 MG tablet Completed Course     Physical Exam:    06/14/2024   11:00 AM 05/23/2024    1:48 PM 05/02/2024   12:00 PM  Vitals with BMI  Height 5' 5 5' 5 5' 5  Weight 211 lbs 215 lbs 13 oz 215 lbs  BMI 35.11 35.91 35.78  Systolic 119 138 882  Diastolic 81 78 80  Pulse 97 122 119  Vital signs reviewed.  Nursing notes reviewed. Weight trend reviewed. Physical Exam General Appearance:  No acute distress appreciable.   Well-groomed, healthy-appearing female.  Well proportioned with no abnormal fat distribution.  Good muscle tone. Pulmonary:  Normal work of breathing at rest, no respiratory distress apparent.    Musculoskeletal: All extremities are intact.  Neurological:  Awake, alert, oriented, and engaged.  No obvious focal neurological deficits or cognitive impairments.  Sensorium seems unclouded.   Speech is clear and coherent with logical content. Psychiatric:  Appropriate mood, pleasant and cooperative demeanor, thoughtful and engaged during the exam Physical Exam  No facial sweating,very polite.  No results found for any visits on 07/20/24. Office Visit on 05/02/2024  Component Date Value Ref Range Status   Vit D, 25-Hydroxy 05/02/2024 46.2  30.0 - 100.0 ng/mL Final   Vitamin B-12 05/02/2024 1,312 (H)   232 - 1,245 pg/mL Final  Office Visit on 01/25/2024  Component Date Value Ref Range Status   Cholesterol 01/25/2024 155  0 - 200 mg/dL Final   Triglycerides 97/89/7974 133.0  0.0 - 149.0 mg/dL Final   HDL 97/89/7974 71.20  >39.00 mg/dL Final   VLDL 97/89/7974 26.6  0.0 - 40.0 mg/dL Final   LDL Cholesterol 01/25/2024 58  0 - 99 mg/dL Final   Total CHOL/HDL Ratio 01/25/2024 2   Final   NonHDL 01/25/2024 84.20   Final   Sodium 01/25/2024 141  135 - 145 mEq/L Final   Potassium 01/25/2024 4.2  3.5 - 5.1 mEq/L Final   Chloride 01/25/2024 103  96 - 112 mEq/L Final   CO2 01/25/2024 29  19 - 32 mEq/L Final   Glucose, Bld 01/25/2024 88  70 - 99 mg/dL Final   BUN 97/89/7974 11  6 - 23 mg/dL Final   Creatinine, Ser 01/25/2024 0.71  0.40 - 1.20 mg/dL Final   Total Bilirubin 01/25/2024 0.2  0.2 - 1.2 mg/dL Final   Alkaline Phosphatase 01/25/2024 81  39 - 117 U/L Final   AST 01/25/2024 26  0 - 37 U/L Final   ALT 01/25/2024 26  0 - 35 U/L Final   Total Protein 01/25/2024 6.7  6.0 - 8.3 g/dL Final   Albumin 97/89/7974 4.2  3.5 - 5.2 g/dL Final   GFR 97/89/7974 99.01  >60.00 mL/min Final   Calcium 01/25/2024 9.0  8.4 - 10.5 mg/dL Final   WBC 97/89/7974 4.3  4.0 - 10.5 K/uL Final   RBC 01/25/2024 4.06  3.87 - 5.11 Mil/uL Final   Hemoglobin 01/25/2024 12.9  12.0 - 15.0 g/dL Final   HCT 97/89/7974 38.9  36.0 - 46.0 % Final   MCV 01/25/2024 95.7  78.0 - 100.0 fl Final   MCHC 01/25/2024 33.1  30.0 - 36.0 g/dL Final   RDW 97/89/7974 13.5  11.5 - 15.5 % Final   Platelets 01/25/2024 236.0  150.0 - 400.0 K/uL Final   Neutrophils Relative % 01/25/2024 52.6  43.0 - 77.0 % Final   Lymphocytes Relative 01/25/2024 37.9  12.0 - 46.0 % Final   Monocytes Relative 01/25/2024 8.5  3.0 - 12.0 % Final   Eosinophils Relative 01/25/2024 0.3  0.0 - 5.0 % Final   Basophils Relative 01/25/2024 0.7  0.0 - 3.0 % Final   Neutro Abs 01/25/2024 2.2  1.4 - 7.7 K/uL Final   Lymphs Abs 01/25/2024 1.6  0.7 - 4.0 K/uL Final    Monocytes Absolute 01/25/2024 0.4  0.1 - 1.0 K/uL Final   Eosinophils Absolute 01/25/2024 0.0  0.0 - 0.7 K/uL Final   Basophils Absolute 01/25/2024 0.0  0.0 - 0.1 K/uL Final   TSH 01/25/2024 1.390  0.450 - 4.500 uIU/mL Final   Hgb A1c MFr Bld 01/25/2024 5.3  4.6 - 6.5 % Final  Office Visit on 09/02/2023  Component Date Value Ref Range Status   Vitamin B-12 09/02/2023 >2000 (H)  232 - 1245 pg/mL Final   Hgb A1c MFr Bld 09/02/2023 5.1  4.8 - 5.6 % Final   Est. average glucose Bld gHb Est-m* 09/02/2023 100  mg/dL Final   Vit D, 74-Ybimnkb 09/02/2023 57.1  30.0 - 100.0 ng/mL Final  Glucose 09/02/2023 93  70 - 99 mg/dL Final   BUN 90/81/7975 14  6 - 24 mg/dL Final   Creatinine, Ser 09/02/2023 0.81  0.57 - 1.00 mg/dL Final   eGFR 90/81/7975 88  >59 mL/min/1.73 Final   BUN/Creatinine Ratio 09/02/2023 17  9 - 23 Final   Sodium 09/02/2023 142  134 - 144 mmol/L Final   Potassium 09/02/2023 4.4  3.5 - 5.2 mmol/L Final   Chloride 09/02/2023 102  96 - 106 mmol/L Final   CO2 09/02/2023 25  20 - 29 mmol/L Final   Calcium 09/02/2023 9.8  8.7 - 10.2 mg/dL Final   Total Protein 90/81/7975 6.8  6.0 - 8.5 g/dL Final   Albumin 90/81/7975 4.6  3.9 - 4.9 g/dL Final   Globulin, Total 09/02/2023 2.2  1.5 - 4.5 g/dL Final   Bilirubin Total 09/02/2023 <0.2  0.0 - 1.2 mg/dL Final   Alkaline Phosphatase 09/02/2023 99  44 - 121 IU/L Final   AST 09/02/2023 15  0 - 40 IU/L Final   ALT 09/02/2023 14  0 - 32 IU/L Final  Office Visit on 04/13/2023  Component Date Value Ref Range Status   Vit D, 25-Hydroxy 04/13/2023 114.0 (H)  30.0 - 100.0 ng/mL Final  Office Visit on 01/22/2023  Component Date Value Ref Range Status   WBC 01/22/2023 8.3  3.8 - 10.8 Thousand/uL Final   RBC 01/22/2023 4.02  3.80 - 5.10 Million/uL Final   Hemoglobin 01/22/2023 13.0  11.7 - 15.5 g/dL Final   HCT 97/91/7975 37.7  35.0 - 45.0 % Final   MCV 01/22/2023 93.8  80.0 - 100.0 fL Final   MCH 01/22/2023 32.3  27.0 - 33.0 pg Final   MCHC  01/22/2023 34.5  32.0 - 36.0 g/dL Final   RDW 97/91/7975 12.6  11.0 - 15.0 % Final   Platelets 01/22/2023 303  140 - 400 Thousand/uL Final   MPV 01/22/2023 9.0  7.5 - 12.5 fL Final   Neutro Abs 01/22/2023 4,731  1,500 - 7,800 cells/uL Final   Lymphs Abs 01/22/2023 2,747  850 - 3,900 cells/uL Final   Absolute Monocytes 01/22/2023 681  200 - 950 cells/uL Final   Eosinophils Absolute 01/22/2023 91  15 - 500 cells/uL Final   Basophils Absolute 01/22/2023 50  0 - 200 cells/uL Final   Neutrophils Relative % 01/22/2023 57  % Final   Total Lymphocyte 01/22/2023 33.1  % Final   Monocytes Relative 01/22/2023 8.2  % Final   Eosinophils Relative 01/22/2023 1.1  % Final   Basophils Relative 01/22/2023 0.6  % Final   Glucose, Bld 01/22/2023 88  65 - 99 mg/dL Final   BUN 97/91/7975 20  7 - 25 mg/dL Final   Creat 97/91/7975 0.88  0.50 - 0.99 mg/dL Final   eGFR 97/91/7975 81  > OR = 60 mL/min/1.45m2 Final   BUN/Creatinine Ratio 01/22/2023 SEE NOTE:  6 - 22 (calc) Final   Sodium 01/22/2023 138  135 - 146 mmol/L Final   Potassium 01/22/2023 4.2  3.5 - 5.3 mmol/L Final   Chloride 01/22/2023 101  98 - 110 mmol/L Final   CO2 01/22/2023 28  20 - 32 mmol/L Final   Calcium 01/22/2023 9.9  8.6 - 10.2 mg/dL Final   Total Protein 97/91/7975 6.7  6.1 - 8.1 g/dL Final   Albumin 97/91/7975 4.6  3.6 - 5.1 g/dL Final   Globulin 97/91/7975 2.1  1.9 - 3.7 g/dL (calc) Final   AG Ratio 01/22/2023 2.2  1.0 -  2.5 (calc) Final   Total Bilirubin 01/22/2023 0.2  0.2 - 1.2 mg/dL Final   Alkaline phosphatase (APISO) 01/22/2023 88  31 - 125 U/L Final   AST 01/22/2023 14  10 - 35 U/L Final   ALT 01/22/2023 12  6 - 29 U/L Final   Cholesterol 01/22/2023 144  <200 mg/dL Final   HDL 97/91/7975 65  > OR = 50 mg/dL Final   Triglycerides 97/91/7975 93  <150 mg/dL Final   LDL Cholesterol (Calc) 01/22/2023 61  mg/dL (calc) Final   Total CHOL/HDL Ratio 01/22/2023 2.2  <4.9 (calc) Final   Non-HDL Cholesterol (Calc) 01/22/2023 79  <130  mg/dL (calc) Final   TSH 97/91/7975 1.18  mIU/L Final   Hgb A1c MFr Bld 01/22/2023 5.3  <5.7 % of total Hgb Final   Mean Plasma Glucose 01/22/2023 105  mg/dL Final   eAG (mmol/L) 97/91/7975 5.8  mmol/L Final   Insulin  01/22/2023 10.9  uIU/mL Final   Vit D, 25-Hydroxy 01/22/2023 122 (H)  30 - 100 ng/mL Final   Color, Urine 01/22/2023 YELLOW  YELLOW Final   APPearance 01/22/2023 CLEAR  CLEAR Final   Specific Gravity, Urine 01/22/2023 1.017  1.001 - 1.035 Final   pH 01/22/2023 < OR = 5.0  5.0 - 8.0 Final   Glucose, UA 01/22/2023 NEGATIVE  NEGATIVE Final   Bilirubin Urine 01/22/2023 NEGATIVE  NEGATIVE Final   Ketones, ur 01/22/2023 NEGATIVE  NEGATIVE Final   Hgb urine dipstick 01/22/2023 NEGATIVE  NEGATIVE Final   Protein, ur 01/22/2023 NEGATIVE  NEGATIVE Final   Nitrite 01/22/2023 NEGATIVE  NEGATIVE Final   Leukocytes,Ua 01/22/2023 NEGATIVE  NEGATIVE Final   Creatinine, Urine 01/22/2023 106  20 - 275 mg/dL Final   Microalb, Ur 97/91/7975 0.9  mg/dL Final   Microalb Creat Ratio 01/22/2023 8  <30 mcg/mg creat Final  No image results found. DG Lumbar Spine Complete Result Date: 05/26/2024 CLINICAL DATA:  Low back pain, recent injury 3 days ago EXAM: LUMBAR SPINE - COMPLETE 4+ VIEW COMPARISON:  02/02/2023 FINDINGS: Similar lower thoracic degenerative disc disease at T10-11 and T11-12 with sclerosis, disc space narrowing, and asymmetric osteophytes, larger on the right. Slight lumbar dextroscoliosis also noted. Minimal scattered lumbar endplate bony osteophytes at L2 through L4. No pars defects. SI joints are maintained. Preserved vertebral body heights without acute osseous finding, compression fracture, or focal kyphosis. IMPRESSION: Degenerative changes as above. No acute finding by plain radiography. Electronically Signed   By: CHRISTELLA.  Shick M.D.   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 Oxybutynin  was effective previously but caused significant xerostomia, raising dental concerns. Glycopyrrolate  is considered due to a potentially lower risk of xerostomia. Side effects, including constipation and cognitive effects, were discussed, with caution advised for exacerbating any underlying constipation. Exploring topical treatments with a new cream option. Botox is a potential future treatment if needed. She prefers aggressive treatment due to daily symptoms impacting work. Prescribe glycopyrrolate  1-2 mg orally twice daily as needed, starting with half a pill to assess tolerance. Send prescription for glycopyrrolate  to [address] [facility] in [place]. Consider glycopyrronium bromide cream from a compounding pharmacy for facial application to minimize systemic side effects. Send prescription for glycopyrronium bromide cream to [facility] for compounding. Encourage trial of topical treatment if cost is reasonable to minimize side effects. Discuss Botox as a  potential future treatment option if current treatments are ineffective.  ORDER ASSOCIATIONS  #   DIAGNOSIS / CONDITION ICD-10 ENCOUNTER ORDER     ICD-10-CM   1. Hyperhidrosis Face  R61 glycopyrrolate  (ROBINUL ) 1 MG tablet    NONFORMULARY OR COMPOUNDED ITEM    DISCONTINUED: NONFORMULARY OR COMPOUNDED ITEM      glycopyrrolate  (ROBINUL ) 1 MG tablet    Sig: Take 2 tablets (2 mg total) by mouth 2 (two) times daily as needed. First dose just half pill to make sure no reactino    Dispense:  120 tablet    Refill:  11   NONFORMULARY OR COMPOUNDED ITEM    Sig: Medication: Glycopyrrolate  2% cream (compounded) Indication: Primary facial (craniofacial) hyperhidrosis Directions for Compounding Pharmacy:  Prepare glycopyrrolate  2% cream or gel, suitable for facial application.  Dispense in a 30-60g tube or jar, as appropriate for 1-2 months of daily use. Sig:  Apply a thin layer of glycopyrrolate  2% cream to  the affected areas of the face once daily.  Avoid contact with eyes, mucous membranes, and broken skin.  Wash hands thoroughly after application. Quantity: 30-60 grams Refills: As needed for ongoing therapy    Dispense:  60 each    Refill:  11     This document was synthesized by artificial intelligence ([employer]bridge) using HIPAA-compliant recording of the clinical interaction;   We discussed the use of AI scribe software for clinical note transcription with the patient, who gave verbal consent to proceed. additional Info: This encounter employed state-of-the-art, real-time, collaborative documentation. The patient actively reviewed and assisted in updating their electronic medical record on a shared screen, ensuring transparency and facilitating joint problem-solving for the problem list, overview, and plan. This approach promotes accurate, informed care. The treatment plan was discussed and reviewed in detail, including medication safety, potential side effects, and all patient questions. We confirmed understanding and comfort with the plan. Follow-up instructions were established, including contacting the office for any concerns, returning if symptoms worsen, persist, or new symptoms develop, and precautions for potential emergency department visits.

## 2024-07-20 NOTE — Patient Instructions (Addendum)
 It was a pleasure seeing you today! Your health and satisfaction are our top priorities.  Bernardino Cone, MD  If medications aren't working, strongly consider botox treatment(s), or even if they are working and side effect(s) problematic  VISIT SUMMARY: Today, you were seen for excessive facial sweating, which you find particularly embarrassing and problematic at work. We discussed your previous experience with oxybutynin  and its side effects, and we explored new treatment options to help manage your symptoms more effectively.  YOUR PLAN: -FACIAL HYPERHIDROSIS: Facial hyperhidrosis is a condition where you experience excessive sweating on your face. We discussed that this is not related to menopause or hot flashes. Since oxybutynin  caused significant dry mouth, we are trying glycopyrrolate , which may have fewer side effects. You should start with half a pill of glycopyrrolate  (1-2 mg) taken orally twice daily as needed to see how you tolerate it. We also discussed a topical treatment option with glycopyrronium bromide cream, which can be applied directly to your face to minimize systemic side effects. If these treatments are not effective, Botox could be considered in the future.  INSTRUCTIONS: Please start with half a pill of glycopyrrolate  (1-2 mg) taken orally twice daily as needed to assess your tolerance. Your prescription for glycopyrrolate  has been sent to [address] [facility] in [place]. Additionally, consider trying the glycopyrronium bromide cream from [facility] if the cost is reasonable. If you experience any side effects or if the treatments are not effective, please schedule a follow-up appointment to discuss further options, including the possibility of Botox treatment.  Your Providers PCP: Cone Bernardino MATSU, MD,  [phone]) Referring Provider: Cone Bernardino MATSU, MD,  [phone]) Care Team Provider: Octavia Bruckner, MD,  [phone]) Care Team Provider: Dannielle Bouchard, [place],  [phone]) Care Team Provider: Blinda Ferry, MD,  [phone]) Care Team Provider: Cleotilde Ronal RAMAN, MD,  [phone])  NEXT STEPS: [x]  Early Intervention: Schedule sooner appointment, call our on-call services, or go to emergency room if there is any significant Increase in pain or discomfort New or worsening symptoms Sudden or severe changes in your health [x]  Flexible Follow-Up: We recommend a No follow-ups on file. for optimal routine care. This allows for progress monitoring and treatment adjustments. [x]  Preventive Care: Schedule your annual preventive care visit! It's typically covered by insurance and helps identify potential health issues early. [x]  Lab & X-ray Appointments: Incomplete tests scheduled today, or call to schedule. X-rays:  [facility] (M-F, 8:30am-noon or 1pm-5pm). [x]  Medical Information Release: Sign a release form at front desk to obtain relevant medical information we don't have.  MAKING THE MOST OF OUR FOCUSED 20 MINUTE APPOINTMENTS: [x]   Clearly state your top concerns at the beginning of the visit to focus our discussion [x]   If you anticipate you will need more time, please inform the front desk during scheduling - we can book multiple appointments in the same week. [x]   If you have transportation problems- use our convenient video appointments or ask about transportation support. [x]   We can get down to business faster if you use MyChart to update information before the visit and submit non-urgent questions before your visit. Thank you for taking the time to provide details through MyChart.  Let our nurse know and she can import this information into your encounter documents.  Arrival and Wait Times: [x]   Arriving on time ensures that everyone receives prompt attention. [x]   Early morning (8a) and afternoon (1p) appointments tend to have shortest wait times. [x]   Unfortunately,  we cannot delay appointments for late arrivals  or hold slots during phone calls.  Getting Answers and Following Up [x]   Simple Questions & Concerns: For quick questions or basic follow-up after your visit, reach us  at [phone] or MyChart messaging. [x]   Complex Concerns: If your concern is more complex, scheduling an appointment might be best. Discuss this with the staff to find the most suitable option. [x]   Lab & Imaging Results: We'll contact you directly if results are abnormal or you don't use MyChart. Most normal results will be on MyChart within 2-3 business days, with a review message from Dr. Jesus. Haven't heard back in 2 weeks? Need results sooner? Contact us  at [phone]. [x]   Referrals: Our referral coordinator will manage specialist referrals. The specialist's office should contact you within 2 weeks to schedule an appointment. Call us  if you haven't heard from them after 2 weeks.  Staying Connected [x]   MyChart: Activate your MyChart for the fastest way to access results and message us . See the last page of this paperwork for instructions on how to activate.  Bring to Your Next Appointment [x]   Medications: Please bring all your medication bottles to your next appointment to ensure we have an accurate record of your prescriptions. [x]   Health Diaries: If you're monitoring any health conditions at home, keeping a diary of your readings can be very helpful for discussions at your next appointment.  Billing [x]   X-ray & Lab Orders: These are billed by separate companies. Contact the invoicing company directly for questions or concerns. [x]   Visit Charges: Discuss any billing inquiries with our administrative services team.  Your Satisfaction Matters [x]   Share Your Experience: We strive for your satisfaction! If you have any complaints, or preferably compliments, please let Dr. Jesus know directly or contact our Practice Administrators, Manuelita Rubin or [employer], by asking at the front desk.   Reviewing  Your Records [x]   Review this early draft of your clinical encounter notes below and the final encounter summary tomorrow on MyChart after its been completed.  All orders placed so far are visible here: Hyperhidrosis Face -     Glycopyrrolate ; Take 2 tablets (2 mg total) by mouth 2 (two) times daily as needed. First dose just half pill to make sure no reactino  Dispense: 120 tablet; Refill: 11 -     NONFORMULARY OR COMPOUNDED ITEM; Medication: Glycopyrrolate  2% cream (compounded) Indication: Primary facial (craniofacial) hyperhidrosis Directions for Compounding Pharmacy:  Prepare glycopyrrolate  2% cream or gel, suitable for facial application.  Dispense in a 30-60g tube or jar, as appropriate for 1-2 months of daily use. Sig:  Apply a thin layer of glycopyrrolate  2% cream to the affected areas of the face once daily.  Avoid contact with eyes, mucous membranes, and broken skin.  Wash hands thoroughly after application. Quantity: 30-60 grams Refills: As needed for ongoing therapy  Dispense: 60 each; Refill: 11    Glycopyrrolate  Tablets What is this medication? GLYCOPYRROLATE  (GLY koe PYE roe late) treats stomach ulcers. It works by reducing the amount of acid in the stomach. This medicine may be used for other purposes; ask your health care provider or pharmacist if you have questions. COMMON BRAND NAME(S): Glycate , Robinul , Robinul  Forte What should I tell my care team before I take this medication? They need to know if you have any of these conditions: Glaucoma Heart disease Inflammatory bowel disease, such as ulcerative colitis Irregular heartbeat or rhythm Kidney disease Myasthenia gravis Stomach or intestine  problems An unusual or allergic reaction to glycopyrrolate , other medications, foods, dyes, or preservatives Pregnant or trying to get pregnant Breast-feeding How should I use this medication? Take this medication by mouth. Take it as directed on the prescription label.  Keep taking it unless your care team tells you to stop. Talk to your care team about the use of this medication in children. Special care may be needed. Overdosage: If you think you have taken too much of this medicine contact a poison control center or emergency room at once. NOTE: This medicine is only for you. Do not share this medicine with others. What if I miss a dose? If you miss a dose, take it as soon as you can. If it is almost time for your next dose, take only that dose. Do not take double or extra doses. What may interact with this medication? Amantadine Antacids Antihistamines for allergy, cough, and cold Atropine Certain formulations of potassium chloride Certain medications for bladder problems, such as oxybutynin , tolterodine Certain medications for irregular heartbeat, such as disopyramide, procainamide, lidocaine, mexiletine, flecainide, propafenone, quinidine Certain medications for mental health conditions Certain medications for Parkinson's disease, such as benztropine, trihexyphenidyl Certain medications for seizures, such as phenobarbital, primidone Certain medications for stomach problems, such as dicyclomine, hyoscyamine Certain medications for travel sickness, such as scopolamine Ipratropium TCAs, medications for depression, such as amitriptyline, clomipramine, desipramine This list may not describe all possible interactions. Give your health care provider a list of all the medicines, herbs, non-prescription drugs, or dietary supplements you use. Also tell them if you smoke, drink alcohol, or use illegal drugs. Some items may interact with your medicine. What should I watch for while using this medication? Visit your care team for regular checks on your progress. Tell your care team if your symptoms do not start to get better or if they get worse. This medication may affect your coordination, reaction time, or judgment. Do not drive or operate machinery until you  know how this medication affects you. Sit up or stand slowly to reduce the risk of dizzy or fainting spells. Drinking alcohol with this medication can increase the risk of these side effects. Avoid extreme heat. This medication can cause you to sweat less than normal. Your body temperature could increase to dangerous levels, which may lead to heat stroke. Your mouth may get dry. Chewing sugarless gum or sucking hard candy and drinking plenty of water may help. Contact your care team if the problem does not go away or is severe. What side effects may I notice from receiving this medication? Side effects that you should report to your care team as soon as possible: Allergic reactions--skin rash, itching, hives, swelling of the face, lips, tongue, or throat Bowel blockage--stomach cramping, unable to have a bowel movement or pass gas, loss of appetite, vomiting Diarrhea Fever that does not go away, decreased sweating Trouble passing urine Side effects that usually do not require medical attention (report to your care team if they continue or are bothersome): Blurry vision Constipation Dry mouth Drowsiness Flushing Vomiting This list may not describe all possible side effects. Call your doctor for medical advice about side effects. You may report side effects to FDA at 1-800-FDA-1088. Where should I keep my medication? Keep out of the reach of children and pets. Store at room temperature between 20 and 25 degrees C (59 and 86 degrees F). Keep container tightly closed. Get rid of any unused medication after the expiration date. To get rid of  medications that are no longer needed or have expired: Take the medication to a medication take-back program. Check with your pharmacy or law enforcement to find a location. If you cannot return the medication, check the label or package insert to see if the medication should be thrown out in the garbage or flushed down the toilet. If you are not sure, ask your  care team. If it is safe to put it in the trash, take the medication out of the container. Mix the medication with cat litter, dirt, coffee grounds, or other unwanted substance. Seal the mixture in a bag or container. Put it in the trash. NOTE: This sheet is a summary. It may not cover all possible information. If you have questions about this medicine, talk to your doctor, pharmacist, or health care provider.  2024 Elsevier/Gold Standard (2023-05-28 00:00:00)

## 2024-07-25 ENCOUNTER — Ambulatory Visit (INDEPENDENT_AMBULATORY_CARE_PROVIDER_SITE_OTHER): Admitting: Adult Health

## 2024-07-27 ENCOUNTER — Ambulatory Visit (INDEPENDENT_AMBULATORY_CARE_PROVIDER_SITE_OTHER): Admitting: Adult Health

## 2024-07-27 ENCOUNTER — Encounter (INDEPENDENT_AMBULATORY_CARE_PROVIDER_SITE_OTHER): Payer: Self-pay | Admitting: Adult Health

## 2024-07-27 VITALS — BP 120/75 | HR 99 | Temp 98.9°F | Ht 65.0 in | Wt 217.0 lb

## 2024-07-27 DIAGNOSIS — E88819 Insulin resistance, unspecified: Secondary | ICD-10-CM

## 2024-07-27 DIAGNOSIS — E669 Obesity, unspecified: Secondary | ICD-10-CM

## 2024-07-27 DIAGNOSIS — E559 Vitamin D deficiency, unspecified: Secondary | ICD-10-CM | POA: Diagnosis not present

## 2024-07-27 DIAGNOSIS — Z6836 Body mass index (BMI) 36.0-36.9, adult: Secondary | ICD-10-CM | POA: Diagnosis not present

## 2024-07-27 DIAGNOSIS — Z6835 Body mass index (BMI) 35.0-35.9, adult: Secondary | ICD-10-CM

## 2024-07-27 NOTE — Progress Notes (Signed)
 WEIGHT SUMMARY AND BIOMETRICS  Vitals Temp: 98.9 F (37.2 C) BP: 120/75 Pulse Rate: 99 SpO2: 99 %   Anthropometric Measurements Height: 5' 5 (1.651 m) Weight: 217 lb (98.4 kg) BMI (Calculated): 36.11 Weight at Last Visit: 211lb Weight Lost Since Last Visit: 0lb Weight Gained Since Last Visit: 6lb Starting Weight: 222lb Total Weight Loss (lbs): 5 lb (2.268 kg)   Body Composition  Body Fat %: 45.6 % Fat Mass (lbs): 99.2 lbs Muscle Mass (lbs): 112.2 lbs Total Body Water (lbs): 76 lbs Visceral Fat Rating : 12   Other Clinical Data Fasting: No Labs: no Today's Visit #: 15 Starting Date: 05/28/22    Chief Complaint:   OBESITY Angelica Weeks is here to discuss her progress with her obesity treatment plan.  She is on the the Category 2 Plan and states she is following her eating plan approximately 30 % of the time.  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 Her shifts vary each week- unpredictable  She lives with her boyfriend, his son (age 61) and her daughter (age 25) It is often a challenge to eat on plan as the other family members often was high CHO meals, ie: pasta  She reports drinking several light beers over weekends  Subjective:   1. Insulin  resistance Lab Results  Component Value Date   HGBA1C 5.3 01/25/2024   HGBA1C 5.1 09/02/2023   HGBA1C 5.3 01/22/2023     Latest Reference Range & Units 07/01/22 15:33 01/22/23 16:05  INSULIN  uIU/mL 9.2 10.9    Latest Reference Range & Units 01/25/24 09:53  GFR >60.00 mL/min 99.01   PCP manages Metformin  XR 500mg  - she has been taking both tabs with evening meal She endorses polyphagia mid morning until dinner time  2. Vitamin D  deficiency  Latest Reference Range & Units 04/13/23 15:33 09/02/23 15:12 05/02/24 12:32  Vitamin D , 25-Hydroxy 30.0 - 100.0 ng/mL 114.0 (H) 57.1 46.2  (H): Data is abnormally high  Vit D  level over replaced Spring 2024 Last two levels are stable  Assessment/Plan:   1. Insulin  resistance (Primary) Take Metformin  XR 500mg - 2 tabs in morning Check Labs at next OV  2. Vitamin D  deficiency Check Labs at next OV  3. BMI 35.0-35.9,adult current 36.2  Shaniquia is not currently in the action stage of change. As such, her goal is to get back to weightloss efforts . She has agreed to the Category 2 Plan.   1) Modify Metformin  XR 500mg - take both tabs in morning with a meal 2) Increase water to 3-4 bottles (16.9 oz) of water per 3) Walk after work shift 2 times per week, at least 15 mins in length  Exercise goals: All adults should avoid inactivity. Some physical activity is better than none, and adults who participate in any amount of physical activity gain some health benefits. Adults should also include muscle-strengthening activities that involve all major muscle groups on 2 or more days a week.  Behavioral modification strategies: increasing lean protein intake, decreasing simple carbohydrates, increasing vegetables, increasing water intake, no skipping meals, meal planning and cooking strategies, keeping healthy foods in the home, ways to avoid boredom eating, and planning for success.  Rosalene has agreed to follow-up with our clinic in 4 weeks. She was informed of the importance of frequent follow-up visits to maximize her success with intensive lifestyle modifications for her multiple health  conditions.   Check Fasting Labs and IC at next OV- pt aware to arrive 30 mins early and only water for PO intake.  Objective:   Blood pressure 120/75, pulse 99, temperature 98.9 F (37.2 C), height 5' 5 (1.651 m), weight 217 lb (98.4 kg), SpO2 99%. Body mass index is 36.11 kg/m.  General: Cooperative, alert, well developed, in no acute distress. HEENT: Conjunctivae and lids unremarkable. Cardiovascular: Regular rhythm.  Lungs: Normal work of breathing. Neurologic: No focal  deficits.   Lab Results  Component Value Date   CREATININE 0.71 01/25/2024   BUN 11 01/25/2024   NA 141 01/25/2024   K 4.2 01/25/2024   CL 103 01/25/2024   CO2 29 01/25/2024   Lab Results  Component Value Date   ALT 26 01/25/2024   AST 26 01/25/2024   ALKPHOS 81 01/25/2024   BILITOT 0.2 01/25/2024   Lab Results  Component Value Date   HGBA1C 5.3 01/25/2024   HGBA1C 5.1 09/02/2023   HGBA1C 5.3 01/22/2023   HGBA1C 5.0 07/01/2022   HGBA1C 5.0 01/22/2022   Lab Results  Component Value Date   INSULIN  9.2 07/01/2022   Lab Results  Component Value Date   TSH 1.390 01/25/2024   Lab Results  Component Value Date   CHOL 155 01/25/2024   HDL 71.20 01/25/2024   LDLCALC 58 01/25/2024   TRIG 133.0 01/25/2024   CHOLHDL 2 01/25/2024   Lab Results  Component Value Date   VD25OH 46.2 05/02/2024   VD25OH 57.1 09/02/2023   VD25OH 114.0 (H) 04/13/2023   Lab Results  Component Value Date   WBC 4.3 01/25/2024   HGB 12.9 01/25/2024   HCT 38.9 01/25/2024   MCV 95.7 01/25/2024   PLT 236.0 01/25/2024   Lab Results  Component Value Date   IRON 92 01/22/2021   TIBC 391 01/22/2021   Attestation Statements:   Reviewed by clinician on day of visit: allergies, medications, problem list, medical history, surgical history, family history, social history, and previous encounter notes.  "26 minutes spent face-to-face with the patient discussing management of disordered eating symptoms, reviewing current medications, and providing strategies for coping with emotional eating."  I have reviewed the above documentation for accuracy and completeness, and I agree with the above. -  Ritik Stavola d. Xitlali Kastens, NP-C

## 2024-08-08 ENCOUNTER — Encounter (INDEPENDENT_AMBULATORY_CARE_PROVIDER_SITE_OTHER): Payer: Self-pay

## 2024-08-31 NOTE — Progress Notes (Unsigned)
 Office: [phone]  /  Fax: [phone]  WEIGHT SUMMARY AND BIOMETRICS  Weight Lost Since Last Visit: 0lb  Weight Gained Since Last Visit: 0lb   Vitals Temp: 98.4 F (36.9 C) BP: 105/68 Pulse Rate: 100 SpO2: 99 %   Anthropometric Measurements Height: 5' 5 (1.651 m) Weight: 217 lb (98.4 kg) BMI (Calculated): 36.11 Weight at Last Visit: 217lb Weight Lost Since Last Visit: 0lb Weight Gained Since Last Visit: 0lb Starting Weight: 222lb Total Weight Loss (lbs): 5 lb (2.268 kg)   Body Composition  Body Fat %: 46 % Fat Mass (lbs): 99.8 lbs Muscle Mass (lbs): 111.2 lbs Total Body Water (lbs): 73.2 lbs Visceral Fat Rating : 12   Other Clinical Data RMR: 1584 Fasting: yes Labs: No Today's Visit #: 16 Starting Date: 05/28/22    Total Weight Loss: 5 pounds Percent of body weight lost: 2.2% Bio Impedence data: Down 1 pound of muscle, up 0.6 pounds of adipose Indirect calorimetry: today VO2 229,  REE 1584. Last  VO2 278, REE in 05/28/2022 was 1915- markedly decreased  HPI  Chief Complaint: OBESITY  Angelica Weeks is here to discuss her progress with her obesity treatment plan. She is on the the Category 2 Plan and states she is following her eating plan approximately 55 % of the time. She states she is exercising 15-20 minutes 1 days per week.   Interval History:  Since last office visit she has been struggling with her protein intake.  She is getting 4 ounces of meat daily. 2 bottles of water water daily. Coffee with half and half in the morning. 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 She does walk the dog but does not do it every day(currently once a week)- corgi/lab mix- wants to walk more.   Breakfast- toast 45 calorie bread with sugar free jelly Lunch: [place] sandwich Dinner: Family wants pasta so having more spaghetti Snacks: fruit, peanut butter  She does have insulin  resistance and has started taking Metformin  500 mg 2 tabs  in the AM. She is trying to limit simple carbs. Her appetite has been better controlled She is fasting today and will recheck insulin  level.  She has a history of Vit D and B12 deficiency. She is currently on no Vit D or B12 supplementation .   PHYSICAL EXAM:  Blood pressure 105/68, pulse 100, temperature 98.4 F (36.9 C), height 5' 5 (1.651 m), weight 217 lb (98.4 kg), SpO2 99%. Body mass index is 36.11 kg/m.  General: Well Developed, well nourished, and in no acute distress.  HEENT: Normocephalic, atraumatic; EOMI, sclerae are anicteric. Skin: Warm and dry, good turgor Chest:  Normal excursion, shape, no gross ABN Respiratory: No conversational dyspnea; speaking in full sentences NeuroM-Sk:  Normal gross ROM * 4 extremities  Psych: A and O X 3, insight adequate, mood- full    DIAGNOSTIC DATA REVIEWED:  BMET    Component Value Date/Time   NA 141 01/25/2024 0953   NA 142 09/02/2023 1512   K 4.2 01/25/2024 0953   CL 103 01/25/2024 0953   CO2 29 01/25/2024 0953   GLUCOSE 88 01/25/2024 0953   BUN 11 01/25/2024 0953   BUN 14 09/02/2023 1512   CREATININE 0.71 01/25/2024 0953   CREATININE 0.88 01/22/2023 1605   CALCIUM 9.0 01/25/2024 0953   GFRNONAA 73 01/22/2021 0951   GFRAA 85 01/22/2021 0951   Lab Results  Component Value Date   HGBA1C 5.3 01/25/2024   HGBA1C  5.6 02/13/2015   Lab Results  Component Value Date   INSULIN  9.2 07/01/2022   Lab Results  Component Value Date   TSH 1.390 01/25/2024   CBC    Component Value Date/Time   WBC 4.3 01/25/2024 0953   RBC 4.06 01/25/2024 0953   HGB 12.9 01/25/2024 0953   HGB 12.8 07/01/2022 1533   HCT 38.9 01/25/2024 0953   HCT 36.4 07/01/2022 1533   PLT 236.0 01/25/2024 0953   PLT 334 07/01/2022 1533   MCV 95.7 01/25/2024 0953   MCV 91 07/01/2022 1533   MCH 32.3 01/22/2023 1605   MCHC 33.1 01/25/2024 0953   RDW 13.5 01/25/2024 0953   RDW 14.1 07/01/2022 1533   Iron Studies    Component Value Date/Time   IRON  92 01/22/2021 0951   TIBC 391 01/22/2021 0951   IRONPCTSAT 24 01/22/2021 0951   Lipid Panel     Component Value Date/Time   CHOL 155 01/25/2024 0953   CHOL 169 07/01/2022 1533   TRIG 133.0 01/25/2024 0953   HDL 71.20 01/25/2024 0953   HDL 68 07/01/2022 1533   CHOLHDL 2 01/25/2024 0953   VLDL 26.6 01/25/2024 0953   LDLCALC 58 01/25/2024 0953   LDLCALC 61 01/22/2023 1605   Hepatic Function Panel     Component Value Date/Time   PROT 6.7 01/25/2024 0953   PROT 6.8 09/02/2023 1512   ALBUMIN 4.2 01/25/2024 0953   ALBUMIN 4.6 09/02/2023 1512   AST 26 01/25/2024 0953   ALT 26 01/25/2024 0953   ALKPHOS 81 01/25/2024 0953   BILITOT 0.2 01/25/2024 0953   BILITOT <0.2 09/02/2023 1512   BILIDIR 0.1 04/13/2017 1117   IBILI 0.1 (L) 04/13/2017 1117      Component Value Date/Time   TSH 1.390 01/25/2024 0953   TSH 1.18 01/22/2023 1605   Nutritional Lab Results  Component Value Date   VD25OH 46.2 05/02/2024   VD25OH 57.1 09/02/2023   VD25OH 114.0 (H) 04/13/2023     ASSESSMENT AND PLAN  TREATMENT PLAN FOR OBESITY: Class 2 severe obesity with serious comorbidity and body mass index (BMI) of 36.0 to 36.9 in adult, unspecified obesity type (HCC) Recommended Dietary Goals  Angelica Weeks is currently in the action stage of change. As such, her goal is to continue weight management plan. She has agreed to the Category 1 Plan.  Behavioral Intervention  We discussed the following Behavioral Modification Strategies today: continue to work on maintaining a reduced calorie state, getting the recommended amount of protein, incorporating whole foods, making healthy choices, staying well hydrated and practicing mindfulness when eating. and increase protein intake, fibrous foods (25 grams per day for women, 30 grams for men) and water to improve satiety and decrease hunger signals. .Discussed in depth minor alterations she can make to her families meals such as zucchini noodles for her when family has  spaghetti.  Importance of 80 grams of protein is stressed.  She is going to focus on increasing her protein  Recommended Physical Activity Goals  Angelica Weeks has been advised to work up to 150 minutes of moderate intensity aerobic activity a week and strengthening exercises 2-3 times per week for cardiovascular health, weight loss maintenance and preservation of muscle mass.   She has agreed to Think about enjoyable ways to increase daily physical activity and overcoming barriers to exercise, Increase physical activity in their day and reduce sedentary time (increase NEAT)., and Combine aerobic and strengthening exercises for efficiency and improved cardiometabolic health.Stressed the importance of  strength training   Pharmacotherapy We discussed various medication options to help Angelica Weeks with her weight loss efforts and we both agreed to continue Metformin  XR 500 mg 2 tabs in the am for insulin  resistance and off label for weight loss .  ASSOCIATED CONDITIONS ADDRESSED TODAY  Action/Plan  Insulin  resistance Continue Category 1 meal plan, limit simple carbs Increase activity and add strength training- hand weights, walk with weight vest Increase protein intake -     Insulin , random -     Comprehensive metabolic panel with GFR  Vitamin D  deficiency If levels have decreased discuss supplementation -     VITAMIN D  25 Hydroxy (Vit-D Deficiency, Fractures)  B12 deficiency Last level was elevated, treat pending results -     Vitamin B12           No follow-ups on file.Angelica Weeks She was informed of the importance of frequent follow up visits to maximize her success with intensive lifestyle modifications for her multiple health conditions.   ATTESTASTION STATEMENTS:  Reviewed by clinician on day of visit: allergies, medications, problem list, medical history, surgical history, family history, social history, and previous encounter notes.   I personally spent a total of 37 minutes in the care of  the patient today including preparing to see the patient, getting/reviewing separately obtained history, performing a medically appropriate exam/evaluation, counseling and educating, placing orders, documenting clinical information in the EHR, independently interpreting results, and coordinating care.   Adrine Hayworth ANP-C

## 2024-09-01 ENCOUNTER — Ambulatory Visit (INDEPENDENT_AMBULATORY_CARE_PROVIDER_SITE_OTHER): Admitting: Adult Health

## 2024-09-01 ENCOUNTER — Ambulatory Visit (INDEPENDENT_AMBULATORY_CARE_PROVIDER_SITE_OTHER): Admitting: Nurse Practitioner

## 2024-09-01 ENCOUNTER — Encounter (INDEPENDENT_AMBULATORY_CARE_PROVIDER_SITE_OTHER): Payer: Self-pay | Admitting: Nurse Practitioner

## 2024-09-01 VITALS — BP 105/68 | HR 100 | Temp 98.4°F | Ht 65.0 in | Wt 217.0 lb

## 2024-09-01 DIAGNOSIS — E66812 Obesity, class 2: Secondary | ICD-10-CM | POA: Diagnosis not present

## 2024-09-01 DIAGNOSIS — G4733 Obstructive sleep apnea (adult) (pediatric): Secondary | ICD-10-CM

## 2024-09-01 DIAGNOSIS — E559 Vitamin D deficiency, unspecified: Secondary | ICD-10-CM

## 2024-09-01 DIAGNOSIS — Z6836 Body mass index (BMI) 36.0-36.9, adult: Secondary | ICD-10-CM

## 2024-09-01 DIAGNOSIS — E538 Deficiency of other specified B group vitamins: Secondary | ICD-10-CM

## 2024-09-01 DIAGNOSIS — E88819 Insulin resistance, unspecified: Secondary | ICD-10-CM | POA: Diagnosis not present

## 2024-09-03 LAB — COMPREHENSIVE METABOLIC PANEL WITH GFR
ALT: 22 IU/L (ref 0–32)
AST: 18 IU/L (ref 0–40)
Albumin: 4.6 g/dL (ref 3.8–4.9)
Alkaline Phosphatase: 103 IU/L (ref 49–135)
BUN/Creatinine Ratio: 15 (ref 9–23)
BUN: 9 mg/dL (ref 6–24)
Bilirubin Total: 0.2 mg/dL (ref 0.0–1.2)
CO2: 24 mmol/L (ref 20–29)
Calcium: 10 mg/dL (ref 8.7–10.2)
Chloride: 101 mmol/L (ref 96–106)
Creatinine, Ser: 0.61 mg/dL (ref 0.57–1.00)
Globulin, Total: 2.2 g/dL (ref 1.5–4.5)
Glucose: 87 mg/dL (ref 70–99)
Potassium: 5 mmol/L (ref 3.5–5.2)
Sodium: 140 mmol/L (ref 134–144)
Total Protein: 6.8 g/dL (ref 6.0–8.5)
eGFR: 108 mL/min/1.73 (ref >59)

## 2024-09-03 LAB — INSULIN, RANDOM: INSULIN: 13.8 u[IU]/mL (ref 2.6–24.9)

## 2024-09-03 LAB — VITAMIN B12: Vitamin B-12: 875 pg/mL (ref 232–1245)

## 2024-09-03 LAB — VITAMIN D 25 HYDROXY (VIT D DEFICIENCY, FRACTURES): Vit D, 25-Hydroxy: 45.8 ng/mL (ref 30.0–100.0)

## 2024-09-05 ENCOUNTER — Ambulatory Visit (INDEPENDENT_AMBULATORY_CARE_PROVIDER_SITE_OTHER): Payer: Self-pay | Admitting: Nurse Practitioner

## 2024-09-26 ENCOUNTER — Encounter: Payer: Self-pay | Admitting: Family Medicine

## 2024-09-26 ENCOUNTER — Ambulatory Visit (INDEPENDENT_AMBULATORY_CARE_PROVIDER_SITE_OTHER): Admitting: Family Medicine

## 2024-09-26 VITALS — BP 126/66 | HR 103 | Temp 98.0°F | Ht 65.0 in | Wt 225.4 lb

## 2024-09-26 DIAGNOSIS — H6121 Impacted cerumen, right ear: Secondary | ICD-10-CM | POA: Diagnosis not present

## 2024-09-26 NOTE — Patient Instructions (Signed)
 Please follow up if symptoms do not improve or as needed.     VISIT SUMMARY: You came in today because of hearing loss in your left ear. You have normal hearing in your right ear and no pain, recent illness, or pressure changes. You suspect that earwax buildup might be the cause.  YOUR PLAN: -IMPACTED CERUMEN, LEFT EAR: Impacted cerumen means that earwax has built up and is blocking your ear canal, which can affect your hearing. We will try to flush out the earwax today. You can also use an over-the-counter ear wax softener called Debrox, applying a few drops daily for several weeks. If your symptoms do not improve, please schedule a follow-up appointment for further evaluation or additional flushing.  INSTRUCTIONS: Please use the ear wax softener as directed and schedule a follow-up appointment if your hearing does not improve or if you need further assistance.                      Contains text generated by Abridge.                                 Contains text generated by Abridge.

## 2024-09-26 NOTE — Progress Notes (Signed)
 Subjective  CC:  Chief Complaint  Patient presents with   Cerumen Impaction    Pt stated that she thinks she has some wax build up bc she can not hear as good. It has seemed to clear up a little but not much    Same day acute visit; PCP not available. New pt to me. Chart reviewed.   HPI: Angelica Weeks is a 53 y.o. female who presents to the office today to address the problems listed above in the chief complaint. Discussed the use of AI scribe software for clinical note transcription with the patient, who gave verbal consent to proceed.  History of Present Illness Angelica Weeks is a 53 year old female who presents with hearing loss in the left ear.  Unilateral hearing loss - Hearing loss in the left ear for the past couple of days - Normal hearing in the right ear - No associated otalgia, upper respiratory symptoms, or sensation of ear pressure - No recent illness or exposure to pressure changes - Suspects cerumen impaction as the etiology - No prior history of similar symptoms  Ear hygiene practices - Uses Q-tips for ear cleaning - No use of earwax softeners in the past   Assessment  1. Hearing loss due to cerumen impaction, right      Plan  Assessment and Plan Assessment & Plan Impacted cerumen, left ear Impaired hearing in the left ear for a couple of days due to impacted cerumen. No associated pain, illness, or pressure. Examination reveals cerumen pushed against the tympanic membrane, likely exacerbated by Q-tip use. Differential includes fluid buildup due to allergies, but no current symptoms suggestive of this. - Attempt to flush out impacted cerumen in the left ear. - Recommend using over-the-counter ear wax softener (Debrox) with a few drops daily for several weeks. - Advise follow-up appointment if symptoms persist or for further flushing if needed.  Nurse irrigate ear with resolution of cerumen impaction.  Follow up: prn No orders of the defined types  were placed in this encounter.  No orders of the defined types were placed in this encounter.    I reviewed the patients updated PMH, FH, and SocHx.  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   History of cannabis dependence/abuse (HCC) 01/25/2024   Inadequate material resources 01/25/2024   Smoking 11/09/2023   Acne vulgaris 11/09/2023   Osteoarthritis of right knee 11/09/2023   Obesity, Beginning BMI 36.94 09/02/2023   OSA (obstructive sleep apnea) 10/14/2022   Hypersomnia, persistent 06/30/2022   Fatigue due to depression 06/30/2022   Insomnia due to other mental disorder 06/30/2022   Nocturia 06/30/2022   Snoring 06/30/2022   Class 2 drug-induced obesity with serious comorbidity and body mass index (BMI) of 38.0 to 38.9 in adult 06/30/2022   Elevated fasting blood sugar 05/28/2022   Vitamin D  deficiency 05/28/2022   B12 deficiency 05/28/2022   Daytime somnolence 04/30/2022   Fatigue 03/24/2022   Postmenopausal estrogen deficiency 03/24/2022   Metabolic syndrome 03/24/2022   Moderate recurrent major depression (HCC) 03/24/2022   Sleep disturbance 03/24/2022   Insulin  resistance 01/23/2022   Elevated prolactin level 01/25/2019   BMI 32.0-32.9,adult 10/06/2018   GERD (gastroesophageal reflux disease) 10/05/2018   ADD (attention deficit disorder)    Bipolar 1 disorder, depressed (HCC)    Current Meds  Medication Sig   ALPRAZolam  (XANAX ) 1 MG tablet Take 1 mg by mouth at bedtime as needed for anxiety.  amphetamine -dextroamphetamine  (ADDERALL) 20 MG tablet Take 1 tablet (20 mg total) by mouth 3 (three) times daily.   atomoxetine  (STRATTERA ) 60 MG capsule Take 60 mg by mouth daily.   celecoxib  (CELEBREX ) 200 MG capsule Take 1 capsule (200 mg total) by mouth daily. Do not take with ibuprofen  or aleve (naproxen).  Drink full glass of water with each dose   glycopyrrolate  (ROBINUL ) 1 MG tablet Take 2 tablets (2 mg total) by mouth 2 (two)  times daily as needed. First dose just half pill to make sure no reactino   lamoTRIgine  (LAMICTAL ) 200 MG tablet Take 2 tablets (400 mg total) by mouth at bedtime.   metFORMIN  (GLUCOPHAGE -XR) 500 MG 24 hr tablet Always take with meals. To start: 1 tab daily for 1 week, then 1 tablet twice daily for 1 week, then 2 tablets twice daily afterwards, if tolerated.   NONFORMULARY OR COMPOUNDED ITEM Medication: Glycopyrrolate  2% cream (compounded) Indication: Primary facial (craniofacial) hyperhidrosis Directions for Compounding Pharmacy:  Prepare glycopyrrolate  2% cream or gel, suitable for facial application.  Dispense in a 30-60g tube or jar, as appropriate for 1-2 months of daily use. Sig:  Apply a thin layer of glycopyrrolate  2% cream to the affected areas of the face once daily.  Avoid contact with eyes, mucous membranes, and broken skin.  Wash hands thoroughly after application. Quantity: 30-60 grams Refills: As needed for ongoing therapy   ondansetron  (ZOFRAN ) 4 MG tablet TAKE 1 TABLET BY MOUTH 3 TIMES A DAY IF NEEDED FOR NAUSEA   trazodone  (DESYREL ) 300 MG tablet Take 300 mg by mouth at bedtime.   tretinoin  (RETIN-A ) 0.1 % cream Apply topically at bedtime.   VRAYLAR 6 MG CAPS TAKE 1 CAPSULE BY MOUTH DAILY   Allergies: Patient is allergic to aspirin. Family History: Patient She was adopted. Family history is unknown by patient. Social History:  Patient  reports that she has been smoking cigarettes. She started smoking about 35 years ago. She has a 35.8 pack-year smoking history. She has been exposed to tobacco smoke. She has never used smokeless tobacco. She reports that she does not currently use alcohol. She reports that she does not currently use drugs after having used the following drugs: Marijuana, Amphetamines, and Benzodiazepines.  Review of Systems: Constitutional: Negative for fever malaise or anorexia Cardiovascular: negative for chest pain Respiratory: negative for SOB  or persistent cough Gastrointestinal: negative for abdominal pain  Objective  Vitals: BP 126/66   Pulse (!) 103   Temp 98 F (36.7 C)   Ht 5' 5 (1.651 m)   Wt 225 lb 6.4 oz (102.2 kg)   LMP  (LMP Unknown) Comment: I've not had a period in 3-4 years  SpO2 99%   BMI 37.51 kg/m  General: no acute distress , A&Ox3 HEENT: PEERL, conjunctiva normal, neck is supple Cardiovascular:  RRR without murmur or gallop.  Respiratory:  Good breath sounds bilaterally, CTAB with normal respiratory effort Skin:  Warm, no rashes Commons side effects, risks, benefits, and alternatives for medications and treatment plan prescribed today were discussed, and the patient expressed understanding of the given instructions. Patient is instructed to call or message via MyChart if he/she has any questions or concerns regarding our treatment plan. No barriers to understanding were identified. We discussed Red Flag symptoms and signs in detail. Patient expressed understanding regarding what to do in case of urgent or emergency type symptoms.  Medication list was reconciled, printed and provided to the patient in AVS. Patient instructions and summary  information was reviewed with the patient as documented in the AVS. This note was prepared with assistance of Dragon voice recognition software. Occasional wrong-word or sound-a-like substitutions may have occurred due to the inherent limitations of voice recognition software

## 2024-09-28 ENCOUNTER — Ambulatory Visit (INDEPENDENT_AMBULATORY_CARE_PROVIDER_SITE_OTHER): Admitting: Adult Health

## 2024-09-28 ENCOUNTER — Encounter (INDEPENDENT_AMBULATORY_CARE_PROVIDER_SITE_OTHER): Payer: Self-pay | Admitting: Adult Health

## 2024-09-28 VITALS — BP 118/78 | HR 113 | Temp 98.4°F | Ht 65.0 in | Wt 220.0 lb

## 2024-09-28 DIAGNOSIS — Z6835 Body mass index (BMI) 35.0-35.9, adult: Secondary | ICD-10-CM

## 2024-09-28 DIAGNOSIS — E559 Vitamin D deficiency, unspecified: Secondary | ICD-10-CM | POA: Diagnosis not present

## 2024-09-28 DIAGNOSIS — E538 Deficiency of other specified B group vitamins: Secondary | ICD-10-CM

## 2024-09-28 DIAGNOSIS — E669 Obesity, unspecified: Secondary | ICD-10-CM

## 2024-09-28 DIAGNOSIS — E88819 Insulin resistance, unspecified: Secondary | ICD-10-CM

## 2024-09-28 DIAGNOSIS — F17210 Nicotine dependence, cigarettes, uncomplicated: Secondary | ICD-10-CM

## 2024-09-28 DIAGNOSIS — Z6836 Body mass index (BMI) 36.0-36.9, adult: Secondary | ICD-10-CM

## 2024-09-28 NOTE — Progress Notes (Signed)
 WEIGHT SUMMARY AND BIOMETRICS  Vitals Temp: 98.4 F (36.9 C) BP: 118/78 Pulse Rate: (!) 113 SpO2: 98 %   Anthropometric Measurements Height: 5' 5 (1.651 m) Weight: 220 lb (99.8 kg) BMI (Calculated): 36.61 Weight at Last Visit: 217 lb Weight Lost Since Last Visit: 0 Weight Gained Since Last Visit: 3 lb Starting Weight: 222 lb Total Weight Loss (lbs): 2 lb (0.907 kg)   Body Composition  Body Fat %: 46 % Fat Mass (lbs): 101.4 lbs Muscle Mass (lbs): 113 lbs Total Body Water (lbs): 75.4 lbs Visceral Fat Rating : 13   Other Clinical Data Fasting: no Labs: no Today's Visit #: 17 Starting Date: 05/28/22    Chief Complaint:   OBESITY Angelica Weeks is here to discuss her progress with her obesity treatment plan.  She is on the the Category 1 Plan and states she is following her eating plan approximately 25 % of the time.  She states she is exercising: None  Interim History:  PCP started her on Metformin  XR 500mg  on/about 01/13/2024 She reports taking 1 tabs with breakfast each morning- denies GI upset PCP original order is to have her titrate up to 2 tabs BID  Her insurance will not cover GLP-1 or GIP/GLP-1 therapy for obesity  Exercise-none  Hydration-she estimates to drink 2 bottles (16.9 ox) water per day  She lives with her boyfriend [place]) and her two children, daughter age 61 and son age 41  Subjective:   1. Insulin  resistance Discussed Labs  Latest Reference Range & Units 09/01/24 08:07  Glucose 70 - 99 mg/dL 87  INSULIN  2.6 - 24.9 uIU/mL 13.8    Latest Reference Range & Units 09/01/24 08:07  eGFR >59 mL/min/1.73 108   Vitamin B12 232 - 1,245 pg/mL 875   GFR normal  B12 level stable  CBG at goal  Insulin  Level above goal of 5  PCP started her on Metformin  XR 500mg  on/about 01/13/2024 She reports taking 1 tabs with breakfast each morning- denies GI upset PCP original order is to have her titrate up to 2 tabs BID   2. B12  deficiency Discussed Labs Vitamin B12 232 - 1,245 pg/mL 875   B12 level stable She is not on B12 supplementation  3. Vitamin D  deficiency Discussed Labs  Latest Reference Range & Units 09/01/24 08:07  Vitamin D , 25-Hydroxy 30.0 - 100.0 ng/mL 45.8       Vit D Level just below goal of 50-70  4. Cigarette nicotine dependence without complication She has remained tobacco free since Jan 2025 Feb 2025 weight 203 lbs Today's weight 220 lbs  She reports steadily increasing appetite levels since tobacco cessation  She is on daily Metformin  XR  Assessment/Plan:   1. Insulin  resistance (Primary) Increase Cat 1 MP plan compliance to at least 85%  2. B12 deficiency Monitor Labs  3. Vitamin D  deficiency Monitor Labs  4. Cigarette nicotine dependence without complication Increase Cat 1 MP plan compliance to at least 85%  5. BMI 35.0-35.9,adult current 36.7  Angelica Weeks is not currently in the action stage of change. As such, her goal is to get back to weightloss efforts . She has agreed to the Category 1 Plan.   Exercise goals: All adults should avoid inactivity. Some physical activity is better than none, and adults who participate in any amount of physical activity gain some health benefits. Adults should also include muscle-strengthening activities that involve all major muscle groups on 2 or more days a week. Walk [place]  15 mins at least 3 x week  Behavioral modification strategies: increasing lean protein intake, decreasing simple carbohydrates, increasing vegetables, increasing water intake, no skipping meals, meal planning and cooking strategies, keeping healthy foods in the home, ways to avoid boredom eating, and planning for success.  Angelica Weeks has agreed to follow-up with our clinic in 4 weeks. She was informed of the importance of frequent follow-up visits to maximize her success with intensive lifestyle modifications for her multiple health conditions.   Objective:   Blood  pressure 118/78, pulse (!) 113, temperature 98.4 F (36.9 C), height 5' 5 (1.651 m), weight 220 lb (99.8 kg), SpO2 98%. Body mass index is 36.61 kg/m.  General: Cooperative, alert, well developed, in no acute distress. HEENT: Conjunctivae and lids unremarkable. Cardiovascular: Regular rhythm.  Lungs: Normal work of breathing. Neurologic: No focal deficits.   Lab Results  Component Value Date   CREATININE 0.61 09/01/2024   BUN 9 09/01/2024   NA 140 09/01/2024   K 5.0 09/01/2024   CL 101 09/01/2024   CO2 24 09/01/2024   Lab Results  Component Value Date   ALT 22 09/01/2024   AST 18 09/01/2024   ALKPHOS 103 09/01/2024   BILITOT 0.2 09/01/2024   Lab Results  Component Value Date   HGBA1C 5.3 01/25/2024   HGBA1C 5.1 09/02/2023   HGBA1C 5.3 01/22/2023   HGBA1C 5.0 07/01/2022   HGBA1C 5.0 01/22/2022   Lab Results  Component Value Date   INSULIN  13.8 09/01/2024   INSULIN  9.2 07/01/2022   Lab Results  Component Value Date   TSH 1.390 01/25/2024   Lab Results  Component Value Date   CHOL 155 01/25/2024   HDL 71.20 01/25/2024   LDLCALC 58 01/25/2024   TRIG 133.0 01/25/2024   CHOLHDL 2 01/25/2024   Lab Results  Component Value Date   VD25OH 45.8 09/01/2024   VD25OH 46.2 05/02/2024   VD25OH 57.1 09/02/2023   Lab Results  Component Value Date   WBC 4.3 01/25/2024   HGB 12.9 01/25/2024   HCT 38.9 01/25/2024   MCV 95.7 01/25/2024   PLT 236.0 01/25/2024   Lab Results  Component Value Date   IRON 92 01/22/2021   TIBC 391 01/22/2021   Attestation Statements:   Reviewed by clinician on day of visit: allergies, medications, problem list, medical history, surgical history, family history, social history, and previous encounter notes.  I have reviewed the above documentation for accuracy and completeness, and I agree with the above. -  Angelica Charon d. Edman Lipsey, NP-C

## 2024-10-10 ENCOUNTER — Ambulatory Visit: Admitting: Internal Medicine

## 2024-10-10 ENCOUNTER — Encounter: Payer: Self-pay | Admitting: Internal Medicine

## 2024-10-10 VITALS — BP 130/80 | HR 110 | Temp 98.0°F | Ht 65.0 in | Wt 226.4 lb

## 2024-10-10 DIAGNOSIS — Z87891 Personal history of nicotine dependence: Secondary | ICD-10-CM | POA: Diagnosis not present

## 2024-10-10 DIAGNOSIS — H65191 Other acute nonsuppurative otitis media, right ear: Secondary | ICD-10-CM

## 2024-10-10 DIAGNOSIS — H6121 Impacted cerumen, right ear: Secondary | ICD-10-CM | POA: Diagnosis not present

## 2024-10-10 MED ORDER — AMOXICILLIN-POT CLAVULANATE 875-125 MG PO TABS: 1.0000 | ORAL_TABLET | Freq: Two times a day (BID) | ORAL | 0 refills | Status: AC

## 2024-10-10 NOTE — Patient Instructions (Signed)
 It was a pleasure seeing you today! Your health and satisfaction are our top priorities.  Angelica Cone, MD  VISIT SUMMARY: Today, you were seen for right ear pain and hearing loss. You reported that the pain and hearing loss started a few days after an ear irrigation procedure you had one to two Angelica ago. The pain is mild, and you have been using earwax softener drops. You have no blood discharge from the ear and did not experience significant pain during the ear flushing procedure. You also shared that you quit smoking nearly a year ago with the help of Chantix  and have not relapsed since January 1st.  YOUR PLAN: -ACUTE RIGHT EAR PAIN WITH HEARING LOSS DUE TO CERUMEN IMPACTION AND POSSIBLE OTITIS MEDIA: Your right ear pain and hearing loss are likely due to earwax buildup and possibly an ear infection. Cerumen impaction means that earwax is blocking your ear canal. We will prescribe Augmentin  in case there is an infection. Please avoid using cotton swabs as they can push the wax further in. Use Debrox drops to help soften the earwax. We will perform ear irrigation to clear the wax, but we will stop if you feel any pain. Avoid getting water in your ear to prevent infection. If the pain goes away after the irrigation, you may not need to take the antibiotics, but keep them on hand in case your symptoms persist.  -NICOTINE DEPENDENCE, IN REMISSION AFTER SMOKING CESSATION: You have successfully quit smoking with the help of Chantix  and have not smoked since January 1st. Nicotine dependence means that you were addicted to nicotine, but you have overcome this. Continue to avoid smoking and stay away from potential triggers. Congratulations on your success, and keep up the great work!  INSTRUCTIONS: Please follow up if your symptoms persist or worsen. Keep the prescribed antibiotics on hand and use them if the pain and hearing loss do not improve after ear irrigation. Continue to avoid using cotton swabs and  keep water out of your ear.  Your Providers PCP: Angelica Angelica MATSU, MD,  [phone]) Referring Provider: [name], MD,  [phone]) Care Team Provider: Octavia Bruckner, MD,  [phone]) Care Team Provider: Dannielle Bouchard, [place],  [phone]) Care Team Provider: Blinda Ferry, MD,  [phone]) Care Team Provider: Cleotilde Ronal RAMAN, MD,  [phone])  NEXT STEPS: [x]  Early Intervention: Schedule sooner appointment, call our on-call services, or go to emergency room if there is any significant Increase in pain or discomfort New or worsening symptoms Sudden or severe changes in your health [x]  Flexible Follow-Up: We recommend a No follow-ups on file. for optimal routine care. This allows for progress monitoring and treatment adjustments. [x]  Preventive Care: Schedule your annual preventive care visit! It's typically covered by insurance and helps identify potential health issues early. [x]  Lab & X-ray Appointments: Incomplete tests scheduled today, or call to schedule. X-rays: [facility] (M-F, 8:30am-noon or 1pm-5pm). [x]  Medical Information Release: Sign a release form at front desk to obtain relevant medical information we don't have.  MAKING THE MOST OF OUR FOCUSED 20 MINUTE APPOINTMENTS: [x]   Clearly state your top concerns at the beginning of the visit to focus our discussion [x]   If you anticipate you will need more time, please inform the front desk during scheduling - we can book multiple appointments in the same week. [x]   If you have transportation problems- use our convenient video appointments or ask about transportation support. [x]   We can get down to business  faster if you use MyChart to update information before the visit and submit non-urgent questions before your visit. Thank you for taking the time to provide details through MyChart.  Let our nurse know and she can import this information into your encounter documents.  Arrival and  Wait Times: [x]   Arriving on time ensures that everyone receives prompt attention. [x]   Early morning (8a) and afternoon (1p) appointments tend to have shortest wait times. [x]   Unfortunately, we cannot delay appointments for late arrivals or hold slots during phone calls.  Getting Answers and Following Up [x]   Simple Questions & Concerns: For quick questions or basic follow-up after your visit, reach us  at [phone] or MyChart messaging. [x]   Complex Concerns: If your concern is more complex, scheduling an appointment might be best. Discuss this with the staff to find the most suitable option. [x]   Lab & Imaging Results: We'll contact you directly if results are abnormal or you don't use MyChart. Most normal results will be on MyChart within 2-3 business days, with a review message from Dr. Jesus. Haven't heard back in 2 Angelica? Need results sooner? Contact us  at [phone]. [x]   Referrals: Our referral coordinator will manage specialist referrals. The specialist's office should contact you within 2 Angelica to schedule an appointment. Call us  if you haven't heard from them after 2 Angelica.  Staying Connected [x]   MyChart: Activate your MyChart for the fastest way to access results and message us . See the last page of this paperwork for instructions on how to activate.  Bring to Your Next Appointment [x]   Medications: Please bring all your medication bottles to your next appointment to ensure we have an accurate record of your prescriptions. [x]   Health Diaries: If you're monitoring any health conditions at home, keeping a diary of your readings can be very helpful for discussions at your next appointment.  Billing [x]   X-ray & Lab Orders: These are billed by separate companies. Contact the invoicing company directly for questions or concerns. [x]   Visit Charges: Discuss any billing inquiries with our administrative services team.  Your Satisfaction Matters [x]   Share Your Experience:  We strive for your satisfaction! If you have any complaints, or preferably compliments, please let Dr. Jesus know directly or contact our Practice Administrators, Manuelita Rubin or [employer], by asking at the front desk.   Reviewing Your Records [x]   Review this early draft of your clinical encounter notes below and the final encounter summary tomorrow on MyChart after its been completed.  All orders placed so far are visible here: Other non-recurrent acute nonsuppurative otitis media of right ear -     Amoxicillin -Pot Clavulanate; Take 1 tablet by mouth 2 (two) times daily.  Dispense: 20 tablet; Refill: 0  History of smoking      .

## 2024-10-10 NOTE — Assessment & Plan Note (Signed)
 Encouraged patient to maintain abstinence

## 2024-10-10 NOTE — Progress Notes (Signed)
 ==============================  [facility]: [phone]   -- Medical Office Visit --  Patient: Angelica Weeks      Age: 53 y.o.       Sex:  female  Date:   10/10/2024 Today's Healthcare Provider: Bernardino KANDICE Cone, MD  ==============================   Chief Complaint: Ear Pain (Right ear can not hear out of it and is pain and the left ear is getting stopped up as well. Couple days now )  Discussed the use of AI scribe software for clinical note transcription with the patient, who gave verbal consent to proceed. History of Present Illness  53 year old female who presents with right ear pain and hearing loss.  She reports right ear pain and hearing loss for a couple of days. Approximately one to two weeks ago, she underwent ear irrigation for earwax removal, which initially resolved her symptoms. However, the pain has returned, described as mild and rated one out of ten, and she cannot hear out of her right ear.  The ear pain began a few days after the ear flushing procedure. During the procedure, a small piece of wax was removed from near the eardrum. She has been using earwax softener drops. She reports no blood discharge from the ear, and describes the pain as mild. She did not experience significant pain during the ear flushing procedure. She previously used Q-tips.  Her current medications include Vraylar, trazodone , Lamictal , etamoxetine, amphetamine , and alprazolam . She takes alprazolam  at a dose of one milligram per day, although she mentions not taking the full dose regularly.  She quit smoking nearly a year ago, attributing her success to the use of Chantix . She has not relapsed since January 1st. No significant emotional or psychiatric issues are reported currently. Background Reviewed: Problem List: has ADD (attention deficit disorder); Bipolar 1 disorder, depressed (HCC); GERD (gastroesophageal reflux disease); BMI 32.0-32.9,adult; Elevated  prolactin level; Insulin  resistance; Fatigue; Postmenopausal estrogen deficiency; Metabolic syndrome; Moderate recurrent major depression (HCC); Sleep disturbance; Daytime somnolence; Elevated fasting blood sugar; Vitamin D  deficiency; B12 deficiency; Hypersomnia, persistent; Fatigue due to depression; Insomnia due to other mental disorder; Nocturia; Snoring; Class 2 drug-induced obesity with serious comorbidity and body mass index (BMI) of 38.0 to 38.9 in adult; OSA (obstructive sleep apnea); Obesity, Beginning BMI 36.94; Acne vulgaris; Osteoarthritis of right knee; History of cannabis dependence/abuse (HCC); Inadequate material resources; Hyperhidrosis Face; and History of smoking on their problem list. Past Medical History:  has a past medical history of ADD (attention deficit disorder), Anxiety, Bipolar 1 disorder, depressed (HCC), Cannabis abuse with psychotic disorder with delusions (HCC) (05/22/2018), Cellulitis of left eyelid (01/2014), Depression, Dysmenorrhea, History of sleep apnea, Paranoid delusion (HCC) (05/22/2018), Schizophrenia (HCC), Smoker unmotivated to quit (10/06/2018), and Vitamin D  deficiency. Past Surgical History:   has a past surgical history that includes Wisdom tooth extraction. Social History:   reports that she has been smoking cigarettes. She started smoking about 35 years ago. She has a 35.8 pack-year smoking history. She has been exposed to tobacco smoke. She has never used smokeless tobacco. She reports that she does not currently use alcohol. She reports that she does not currently use drugs after having used the following drugs: Marijuana, Amphetamines, and Benzodiazepines. Family History:  She was adopted. Family history is unknown by patient. Allergies:  is allergic to aspirin.   Medication Reconciliation: Current Outpatient Medications on File Prior to Visit  Medication Sig   ALPRAZolam  (XANAX ) 1 MG tablet Take 1 mg by  mouth at bedtime as needed for anxiety.    amphetamine -dextroamphetamine  (ADDERALL) 20 MG tablet Take 1 tablet (20 mg total) by mouth 3 (three) times daily.   atomoxetine  (STRATTERA ) 60 MG capsule Take 60 mg by mouth daily.   celecoxib  (CELEBREX ) 200 MG capsule Take 1 capsule (200 mg total) by mouth daily. Do not take with ibuprofen  or aleve (naproxen).  Drink full glass of water with each dose   glycopyrrolate  (ROBINUL ) 1 MG tablet Take 2 tablets (2 mg total) by mouth 2 (two) times daily as needed. First dose just half pill to make sure no reactino   lamoTRIgine  (LAMICTAL ) 200 MG tablet Take 2 tablets (400 mg total) by mouth at bedtime.   metFORMIN  (GLUCOPHAGE -XR) 500 MG 24 hr tablet Always take with meals. To start: 1 tab daily for 1 week, then 1 tablet twice daily for 1 week, then 2 tablets twice daily afterwards, if tolerated.   NONFORMULARY OR COMPOUNDED ITEM Medication: Glycopyrrolate  2% cream (compounded) Indication: Primary facial (craniofacial) hyperhidrosis Directions for Compounding Pharmacy:  Prepare glycopyrrolate  2% cream or gel, suitable for facial application.  Dispense in a 30-60g tube or jar, as appropriate for 1-2 months of daily use. Sig:  Apply a thin layer of glycopyrrolate  2% cream to the affected areas of the face once daily.  Avoid contact with eyes, mucous membranes, and broken skin.  Wash hands thoroughly after application. Quantity: 30-60 grams Refills: As needed for ongoing therapy   ondansetron  (ZOFRAN ) 4 MG tablet TAKE 1 TABLET BY MOUTH 3 TIMES A DAY IF NEEDED FOR NAUSEA   trazodone  (DESYREL ) 300 MG tablet Take 300 mg by mouth at bedtime.   tretinoin  (RETIN-A ) 0.1 % cream Apply topically at bedtime.   VRAYLAR 6 MG CAPS TAKE 1 CAPSULE BY MOUTH DAILY   No current facility-administered medications on file prior to visit.  There are no discontinued medications.   Physical Exam:    10/10/2024    1:09 PM 09/28/2024    2:00 PM 09/26/2024   10:35 AM  Vitals with BMI  Height 5' 5 5' 5 5' 5   Weight 226 lbs 6 oz 220 lbs 225 lbs 6 oz  BMI 37.67 36.61 37.51  Systolic 130 118 873  Diastolic 80 78 66  Pulse 110 113 103  Vital signs reviewed.  Nursing notes reviewed. Weight trend reviewed. Physical Activity: Insufficiently Active (10/06/2018)   Exercise Vital Sign    Days of Exercise per Week: 7 days    Minutes of Exercise per Session: 10 min   General Appearance:  No acute distress appreciable.   Well-groomed, healthy-appearing female.  Well proportioned with no abnormal fat distribution.  Good muscle tone. Pulmonary:  Normal work of breathing at rest, no respiratory distress apparent. SpO2: 98 %  Musculoskeletal: All extremities are intact.  Neurological:  Awake, alert, oriented, and engaged.  No obvious focal neurological deficits or cognitive impairments.  Sensorium seems unclouded.   Speech is clear and coherent with logical content. Psychiatric:  Appropriate mood, pleasant and cooperative demeanor, thoughtful and engaged during the exam   Verbalized to patient: Physical Exam HEENT: Cerumen impaction on right tympanic membrane. Suspected tympanic membrane perforation on the right.   Results:   Verbalized to patient: Results Procedure: Ear irrigation Description: Irrigation of the right ear was performed to remove cerumen. The tympanic membrane could not be visualized due to cerumen coating. No perforation was observed during the procedure. Informed Consent: Informed consent included discussion of the risks of perforation and infection,  benefits of clearing the ear canal, and alternatives such as using ear drops.     10/10/2024    1:13 PM 01/25/2024    9:00 AM 11/09/2023    3:15 PM 05/28/2022    7:57 AM  PHQ 2/9 Scores  PHQ - 2 Score 0 0 4 5  PHQ- 9 Score 0  12 21    Office Visit on 09/01/2024  Component Date Value Ref Range Status   INSULIN  09/01/2024 13.8  2.6 - 24.9 uIU/mL Final   Vit D, 25-Hydroxy 09/01/2024 45.8  30.0 - 100.0 ng/mL Final   Glucose  09/01/2024 87  70 - 99 mg/dL Final   BUN 90/81/7974 9  6 - 24 mg/dL Final   Creatinine, Ser 09/01/2024 0.61  0.57 - 1.00 mg/dL Final   eGFR 90/81/7974 108  >59 mL/min/1.73 Final   BUN/Creatinine Ratio 09/01/2024 15  9 - 23 Final   Sodium 09/01/2024 140  134 - 144 mmol/L Final   Potassium 09/01/2024 5.0  3.5 - 5.2 mmol/L Final   Chloride 09/01/2024 101  96 - 106 mmol/L Final   CO2 09/01/2024 24  20 - 29 mmol/L Final   Calcium 09/01/2024 10.0  8.7 - 10.2 mg/dL Final   Total Protein 90/81/7974 6.8  6.0 - 8.5 g/dL Final   Albumin 90/81/7974 4.6  3.8 - 4.9 g/dL Final   Globulin, Total 09/01/2024 2.2  1.5 - 4.5 g/dL Final   Bilirubin Total 09/01/2024 0.2  0.0 - 1.2 mg/dL Final   Alkaline Phosphatase 09/01/2024 103  49 - 135 IU/L Final   AST 09/01/2024 18  0 - 40 IU/L Final   ALT 09/01/2024 22  0 - 32 IU/L Final   Vitamin B-12 09/01/2024 875  232 - 1,245 pg/mL Final  Office Visit on 05/02/2024  Component Date Value Ref Range Status   Vit D, 25-Hydroxy 05/02/2024 46.2  30.0 - 100.0 ng/mL Final   Vitamin B-12 05/02/2024 1,312 (H)  232 - 1,245 pg/mL Final  Office Visit on 01/25/2024  Component Date Value Ref Range Status   Cholesterol 01/25/2024 155  0 - 200 mg/dL Final   Triglycerides 97/89/7974 133.0  0.0 - 149.0 mg/dL Final   HDL 97/89/7974 71.20  >39.00 mg/dL Final   VLDL 97/89/7974 26.6  0.0 - 40.0 mg/dL Final   LDL Cholesterol 01/25/2024 58  0 - 99 mg/dL Final   Total CHOL/HDL Ratio 01/25/2024 2   Final   NonHDL 01/25/2024 84.20   Final   Sodium 01/25/2024 141  135 - 145 mEq/L Final   Potassium 01/25/2024 4.2  3.5 - 5.1 mEq/L Final   Chloride 01/25/2024 103  96 - 112 mEq/L Final   CO2 01/25/2024 29  19 - 32 mEq/L Final   Glucose, Bld 01/25/2024 88  70 - 99 mg/dL Final   BUN 97/89/7974 11  6 - 23 mg/dL Final   Creatinine, Ser 01/25/2024 0.71  0.40 - 1.20 mg/dL Final   Total Bilirubin 01/25/2024 0.2  0.2 - 1.2 mg/dL Final   Alkaline Phosphatase 01/25/2024 81  39 - 117 U/L Final    AST 01/25/2024 26  0 - 37 U/L Final   ALT 01/25/2024 26  0 - 35 U/L Final   Total Protein 01/25/2024 6.7  6.0 - 8.3 g/dL Final   Albumin 97/89/7974 4.2  3.5 - 5.2 g/dL Final   GFR 97/89/7974 99.01  >60.00 mL/min Final   Calcium 01/25/2024 9.0  8.4 - 10.5 mg/dL Final   WBC 97/89/7974 4.3  4.0 - 10.5 K/uL Final   RBC 01/25/2024 4.06  3.87 - 5.11 Mil/uL Final   Hemoglobin 01/25/2024 12.9  12.0 - 15.0 g/dL Final   HCT 97/89/7974 38.9  36.0 - 46.0 % Final   MCV 01/25/2024 95.7  78.0 - 100.0 fl Final   MCHC 01/25/2024 33.1  30.0 - 36.0 g/dL Final   RDW 97/89/7974 13.5  11.5 - 15.5 % Final   Platelets 01/25/2024 236.0  150.0 - 400.0 K/uL Final   Neutrophils Relative % 01/25/2024 52.6  43.0 - 77.0 % Final   Lymphocytes Relative 01/25/2024 37.9  12.0 - 46.0 % Final   Monocytes Relative 01/25/2024 8.5  3.0 - 12.0 % Final   Eosinophils Relative 01/25/2024 0.3  0.0 - 5.0 % Final   Basophils Relative 01/25/2024 0.7  0.0 - 3.0 % Final   Neutro Abs 01/25/2024 2.2  1.4 - 7.7 K/uL Final   Lymphs Abs 01/25/2024 1.6  0.7 - 4.0 K/uL Final   Monocytes Absolute 01/25/2024 0.4  0.1 - 1.0 K/uL Final   Eosinophils Absolute 01/25/2024 0.0  0.0 - 0.7 K/uL Final   Basophils Absolute 01/25/2024 0.0  0.0 - 0.1 K/uL Final   TSH 01/25/2024 1.390  0.450 - 4.500 uIU/mL Final   Hgb A1c MFr Bld 01/25/2024 5.3  4.6 - 6.5 % Final  Office Visit on 09/02/2023  Component Date Value Ref Range Status   Vitamin B-12 09/02/2023 >2000 (H)  232 - 1245 pg/mL Final   Hgb A1c MFr Bld 09/02/2023 5.1  4.8 - 5.6 % Final   Est. average glucose Bld gHb Est-m* 09/02/2023 100  mg/dL Final   Vit D, 74-Ybimnkb 09/02/2023 57.1  30.0 - 100.0 ng/mL Final   Glucose 09/02/2023 93  70 - 99 mg/dL Final   BUN 90/81/7975 14  6 - 24 mg/dL Final   Creatinine, Ser 09/02/2023 0.81  0.57 - 1.00 mg/dL Final   eGFR 90/81/7975 88  >59 mL/min/1.73 Final   BUN/Creatinine Ratio 09/02/2023 17  9 - 23 Final   Sodium 09/02/2023 142  134 - 144 mmol/L Final    Potassium 09/02/2023 4.4  3.5 - 5.2 mmol/L Final   Chloride 09/02/2023 102  96 - 106 mmol/L Final   CO2 09/02/2023 25  20 - 29 mmol/L Final   Calcium 09/02/2023 9.8  8.7 - 10.2 mg/dL Final   Total Protein 90/81/7975 6.8  6.0 - 8.5 g/dL Final   Albumin 90/81/7975 4.6  3.9 - 4.9 g/dL Final   Globulin, Total 09/02/2023 2.2  1.5 - 4.5 g/dL Final   Bilirubin Total 09/02/2023 <0.2  0.0 - 1.2 mg/dL Final   Alkaline Phosphatase 09/02/2023 99  44 - 121 IU/L Final   AST 09/02/2023 15  0 - 40 IU/L Final   ALT 09/02/2023 14  0 - 32 IU/L Final  Office Visit on 04/13/2023  Component Date Value Ref Range Status   Vit D, 25-Hydroxy 04/13/2023 114.0 (H)  30.0 - 100.0 ng/mL Final  Office Visit on 01/22/2023  Component Date Value Ref Range Status   WBC 01/22/2023 8.3  3.8 - 10.8 Thousand/uL Final   RBC 01/22/2023 4.02  3.80 - 5.10 Million/uL Final   Hemoglobin 01/22/2023 13.0  11.7 - 15.5 g/dL Final   HCT 97/91/7975 37.7  35.0 - 45.0 % Final   MCV 01/22/2023 93.8  80.0 - 100.0 fL Final   MCH 01/22/2023 32.3  27.0 - 33.0 pg Final   MCHC 01/22/2023 34.5  32.0 - 36.0 g/dL Final  RDW 01/22/2023 12.6  11.0 - 15.0 % Final   Platelets 01/22/2023 303  140 - 400 Thousand/uL Final   MPV 01/22/2023 9.0  7.5 - 12.5 fL Final   Neutro Abs 01/22/2023 4,731  1,500 - 7,800 cells/uL Final   Lymphs Abs 01/22/2023 2,747  850 - 3,900 cells/uL Final   Absolute Monocytes 01/22/2023 681  200 - 950 cells/uL Final   Eosinophils Absolute 01/22/2023 91  15 - 500 cells/uL Final   Basophils Absolute 01/22/2023 50  0 - 200 cells/uL Final   Neutrophils Relative % 01/22/2023 57  % Final   Total Lymphocyte 01/22/2023 33.1  % Final   Monocytes Relative 01/22/2023 8.2  % Final   Eosinophils Relative 01/22/2023 1.1  % Final   Basophils Relative 01/22/2023 0.6  % Final   Glucose, Bld 01/22/2023 88  65 - 99 mg/dL Final   BUN 97/91/7975 20  7 - 25 mg/dL Final   Creat 97/91/7975 0.88  0.50 - 0.99 mg/dL Final   eGFR 97/91/7975 81  > OR  = 60 mL/min/1.45m2 Final   BUN/Creatinine Ratio 01/22/2023 SEE NOTE:  6 - 22 (calc) Final   Sodium 01/22/2023 138  135 - 146 mmol/L Final   Potassium 01/22/2023 4.2  3.5 - 5.3 mmol/L Final   Chloride 01/22/2023 101  98 - 110 mmol/L Final   CO2 01/22/2023 28  20 - 32 mmol/L Final   Calcium 01/22/2023 9.9  8.6 - 10.2 mg/dL Final   Total Protein 97/91/7975 6.7  6.1 - 8.1 g/dL Final   Albumin 97/91/7975 4.6  3.6 - 5.1 g/dL Final   Globulin 97/91/7975 2.1  1.9 - 3.7 g/dL (calc) Final   AG Ratio 01/22/2023 2.2  1.0 - 2.5 (calc) Final   Total Bilirubin 01/22/2023 0.2  0.2 - 1.2 mg/dL Final   Alkaline phosphatase (APISO) 01/22/2023 88  31 - 125 U/L Final   AST 01/22/2023 14  10 - 35 U/L Final   ALT 01/22/2023 12  6 - 29 U/L Final   Cholesterol 01/22/2023 144  <200 mg/dL Final   HDL 97/91/7975 65  > OR = 50 mg/dL Final   Triglycerides 97/91/7975 93  <150 mg/dL Final   LDL Cholesterol (Calc) 01/22/2023 61  mg/dL (calc) Final   Total CHOL/HDL Ratio 01/22/2023 2.2  <4.9 (calc) Final   Non-HDL Cholesterol (Calc) 01/22/2023 79  <130 mg/dL (calc) Final   TSH 97/91/7975 1.18  mIU/L Final   Hgb A1c MFr Bld 01/22/2023 5.3  <5.7 % of total Hgb Final   Mean Plasma Glucose 01/22/2023 105  mg/dL Final   eAG (mmol/L) 97/91/7975 5.8  mmol/L Final   Insulin  01/22/2023 10.9  uIU/mL Final   Vit D, 25-Hydroxy 01/22/2023 122 (H)  30 - 100 ng/mL Final   Color, Urine 01/22/2023 YELLOW  YELLOW Final   APPearance 01/22/2023 CLEAR  CLEAR Final   Specific Gravity, Urine 01/22/2023 1.017  1.001 - 1.035 Final   pH 01/22/2023 < OR = 5.0  5.0 - 8.0 Final   Glucose, UA 01/22/2023 NEGATIVE  NEGATIVE Final   Bilirubin Urine 01/22/2023 NEGATIVE  NEGATIVE Final   Ketones, ur 01/22/2023 NEGATIVE  NEGATIVE Final   Hgb urine dipstick 01/22/2023 NEGATIVE  NEGATIVE Final   Protein, ur 01/22/2023 NEGATIVE  NEGATIVE Final   Nitrite 01/22/2023 NEGATIVE  NEGATIVE Final   Leukocytes,Ua 01/22/2023 NEGATIVE  NEGATIVE Final    Creatinine, Urine 01/22/2023 106  20 - 275 mg/dL Final   Microalb, Ur 97/91/7975 0.9  mg/dL Final  Microalb Creat Ratio 01/22/2023 8  <30 mcg/mg creat Final  No image results found. No results found.  PROCEDURE:  PROCEDURE: CERUMEN DISIMPACTION   Otoscopic viewing of the tympanic membrane was initially obstructed by copious impacted cerumen in the external auditory canal, so disimpaction by irrigation was recommended.  The associated and risk of tympanic membrane perforation was discussed and verbal consent was obtained prior to performing the procedure. The affected right auditory canal(s) were then irrigated by gentle ear lavage with semisuccessful removal of impacted cerumen as confirmed on post procedural repeat otoscopy and visualization of cerumen discharge.  Also, the tympanic membranes appeared to still be intact immediately following the procedure, while auditory canals were inflamed.   Patient tolerated well without complications.    After the procedure, the patient reported: some relief partial hearing returned.       ASSESSMENT & PLAN   Assessment & Plan Other non-recurrent acute nonsuppurative otitis media of right ear Acute right ear pain with hearing loss due to cerumen impaction and possible otitis media   Right ear pain and hearing loss are likely caused by cerumen impaction. Tympanic membrane perforation is considered but less likely due to the eardrum being coated with wax. Possible acute otitis media is suspected due to persistent impaction. Pain is mild at 1/10. Prescribe Augmentin  for possible infection. Advise against using cotton swabs to prevent further impaction. Recommend using Debrox to soften earwax. Perform ear irrigation to clear wax, stopping if pain occurs. Instruct to avoid getting water in the ear to prevent infection. If pain resolves after irrigation, hold off on antibiotics but keep them on hand if symptoms persist.  Nicotine dependence, in remission after  smoking cessation   She successfully quit smoking with the aid of Chantix  and has not relapsed since January 1st, approaching one year of cessation. Continue to avoid smoking and potential triggers. Acknowledge her success and encourage continued abstinence. History of smoking Encouraged patient to maintain abstinence  ORDER ASSOCIATIONS  #   DIAGNOSIS / CONDITION ICD-10 ENCOUNTER ORDER     ICD-10-CM   1. Other non-recurrent acute nonsuppurative otitis media of right ear  H65.191 amoxicillin -clavulanate (AUGMENTIN ) 875-125 MG tablet    2. History of smoking  Z87.891      Meds ordered this encounter  Medications   amoxicillin -clavulanate (AUGMENTIN ) 875-125 MG tablet    Sig: Take 1 tablet by mouth 2 (two) times daily.    Dispense:  20 tablet    Refill:  0     This document was synthesized by artificial intelligence ([employer]) using HIPAA-compliant recording of the clinical interaction;   We discussed the use of AI scribe software for clinical note transcription with the patient, who gave verbal consent to proceed. additional Info: This encounter employed state-of-the-art, real-time, collaborative documentation. The patient actively reviewed and assisted in updating their electronic medical record on a shared screen, ensuring transparency and facilitating joint problem-solving for the problem list, overview, and plan. This approach promotes accurate, informed care. The treatment plan was discussed and reviewed in detail, including medication safety, potential side effects, and all patient questions. We confirmed understanding and comfort with the plan. Follow-up instructions were established, including contacting the office for any concerns, returning if symptoms worsen, persist, or new symptoms develop, and precautions for potential emergency department visits.

## 2024-10-26 ENCOUNTER — Ambulatory Visit (INDEPENDENT_AMBULATORY_CARE_PROVIDER_SITE_OTHER): Payer: Self-pay | Admitting: Adult Health

## 2024-11-03 ENCOUNTER — Encounter: Payer: Self-pay | Admitting: Internal Medicine

## 2024-11-03 DIAGNOSIS — L814 Other melanin hyperpigmentation: Secondary | ICD-10-CM

## 2024-11-06 MED ORDER — AZELAIC ACID 15 % EX GEL: 1.0000 | Freq: Two times a day (BID) | CUTANEOUS | 11 refills | Status: AC

## 2024-11-22 ENCOUNTER — Ambulatory Visit (INDEPENDENT_AMBULATORY_CARE_PROVIDER_SITE_OTHER): Admitting: Adult Health

## 2024-11-22 VITALS — BP 132/64 | HR 105 | Temp 98.6°F | Ht 65.0 in | Wt 224.0 lb

## 2024-11-22 DIAGNOSIS — E538 Deficiency of other specified B group vitamins: Secondary | ICD-10-CM

## 2024-11-22 DIAGNOSIS — E88819 Insulin resistance, unspecified: Secondary | ICD-10-CM

## 2024-11-22 DIAGNOSIS — Z6835 Body mass index (BMI) 35.0-35.9, adult: Secondary | ICD-10-CM

## 2024-11-22 DIAGNOSIS — Z87891 Personal history of nicotine dependence: Secondary | ICD-10-CM

## 2024-11-22 NOTE — Progress Notes (Signed)
 WEIGHT SUMMARY AND BIOMETRICS  Vitals Temp: 98.6 F (37 C) BP: 132/64 Pulse Rate: (!) 105 SpO2: 99 %   Anthropometric Measurements Height: 5' 5 (1.651 m) Weight: 224 lb (101.6 kg) BMI (Calculated): 37.28 Weight at Last Visit: 220 lb Weight Lost Since Last Visit: 0 Weight Gained Since Last Visit: 4 lb Starting Weight: 222 lb Total Weight Loss (lbs): 0 lb (0 kg)   Body Composition  Body Fat %: 47 % Fat Mass (lbs): 105.6 lbs Muscle Mass (lbs): 113 lbs Total Body Water (lbs): 75.4 lbs Visceral Fat Rating : 13   Other Clinical Data Fasting: no Labs: no Today's Visit #: 18 Starting Date: 05/28/22    Chief Complaint:   OBESITY Angelica Weeks is here to discuss her progress with her obesity treatment plan.  She is on the the Category 1 Plan and states she is following her eating plan approximately 10 % of the time.  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 She reports financial stress which has triggered emotional eating behaviors. She will often snack on sugary foods, ie: cakes, cookies. She lives with her long term boyfriend Angelica Weeks- they have been together > 20 years She also lives with step son, age 60- works and attends college and her daughter, age- attends college.  She has been passively searching for other employment opportunities.  She prefers to work only PT.  Subjective:   1. Insulin  resistance  Latest Reference Range & Units 07/01/22 15:33 01/22/23 16:05 09/01/24 08:07  INSULIN  2.6 - 24.9 uIU/mL 9.2 10.9 13.8   PCP manages Metformin  XR 500mg - 2 tabs daily with breakfast. She endorses emotional eating behavior, especially in evening after dinner. Recommend to adjust Metformin  dosing to take with dinner meal- she is agreeable for new timing.  2. B12 deficiency  Latest Reference Range  & Units 09/02/23 15:12 05/02/24 12:32 09/01/24 08:07  Vitamin B12 232 - 1,245 pg/mL >2000 (H) 1,312 (H) 875  (H): Data is abnormally high  B12 level has settled down to normal level  Assessment/Plan:   1. Insulin  resistance Recommend to adjust Metformin  dosing to take with dinner meal.  2. B12 deficiency Monitor Labs  4. BMI 35.0-35.9,adult current 37.4  Angelica Weeks is currently in the action stage of change. As such, her goal is to continue with weight loss efforts. She has agreed to the Category 1 Plan.   Exercise goals: All adults should avoid inactivity. Some physical activity is better than none, and adults who participate in any amount of physical activity gain some health benefits. Adults should also include muscle-strengthening activities that involve all major muscle groups on 2 or more days a week. Walk Angelica Weeks at least 3 x week  Behavioral modification strategies: increasing lean protein intake, decreasing simple carbohydrates, increasing vegetables, increasing water intake, no skipping meals, meal planning and cooking strategies, keeping healthy foods in the home, ways to avoid boredom eating, ways to avoid night time snacking, better snacking choices, emotional eating strategies, holiday eating strategies , and planning for success.  Angelica Weeks has agreed to follow-up with our clinic in 4 weeks. She was informed of the importance of frequent follow-up visits to maximize her success with intensive lifestyle modifications for her multiple health conditions.   Objective:   Blood pressure 132/64, pulse (!) 105,  temperature 98.6 F (37 C), height 5' 5 (1.651 m), weight 224 lb (101.6 kg), SpO2 99%. Body mass index is 37.28 kg/m.  General: Cooperative, alert, well developed, in no acute distress. HEENT: Conjunctivae and lids unremarkable. Cardiovascular: Regular rhythm.  Lungs: Normal work of breathing. Neurologic: No focal deficits.   Lab Results  Component Value Date    CREATININE 0.61 09/01/2024   BUN 9 09/01/2024   NA 140 09/01/2024   K 5.0 09/01/2024   CL 101 09/01/2024   CO2 24 09/01/2024   Lab Results  Component Value Date   ALT 22 09/01/2024   AST 18 09/01/2024   ALKPHOS 103 09/01/2024   BILITOT 0.2 09/01/2024   Lab Results  Component Value Date   HGBA1C 5.3 01/25/2024   HGBA1C 5.1 09/02/2023   HGBA1C 5.3 01/22/2023   HGBA1C 5.0 07/01/2022   HGBA1C 5.0 01/22/2022   Lab Results  Component Value Date   INSULIN  13.8 09/01/2024   INSULIN  9.2 07/01/2022   Lab Results  Component Value Date   TSH 1.390 01/25/2024   Lab Results  Component Value Date   CHOL 155 01/25/2024   HDL 71.20 01/25/2024   LDLCALC 58 01/25/2024   TRIG 133.0 01/25/2024   CHOLHDL 2 01/25/2024   Lab Results  Component Value Date   VD25OH 45.8 09/01/2024   VD25OH 46.2 05/02/2024   VD25OH 57.1 09/02/2023   Lab Results  Component Value Date   WBC 4.3 01/25/2024   HGB 12.9 01/25/2024   HCT 38.9 01/25/2024   MCV 95.7 01/25/2024   PLT 236.0 01/25/2024   Lab Results  Component Value Date   IRON 92 01/22/2021   TIBC 391 01/22/2021    Attestation Statements:   Reviewed by clinician on day of visit: allergies, medications, problem list, medical history, surgical history, family history, social history, and previous encounter notes.   I have reviewed the above documentation for accuracy and completeness, and I agree with the above. -  Rexford Prevo d. Rashidi Loh, NP-C

## 2024-12-21 ENCOUNTER — Ambulatory Visit (INDEPENDENT_AMBULATORY_CARE_PROVIDER_SITE_OTHER): Admitting: Adult Health

## 2024-12-21 ENCOUNTER — Encounter (INDEPENDENT_AMBULATORY_CARE_PROVIDER_SITE_OTHER): Payer: Self-pay | Admitting: Adult Health

## 2024-12-21 VITALS — BP 108/68 | HR 118 | Temp 98.3°F | Ht 65.0 in | Wt 228.0 lb

## 2024-12-21 DIAGNOSIS — Z6838 Body mass index (BMI) 38.0-38.9, adult: Secondary | ICD-10-CM | POA: Diagnosis not present

## 2024-12-21 DIAGNOSIS — E538 Deficiency of other specified B group vitamins: Secondary | ICD-10-CM

## 2024-12-21 DIAGNOSIS — E669 Obesity, unspecified: Secondary | ICD-10-CM | POA: Diagnosis not present

## 2024-12-21 DIAGNOSIS — E88819 Insulin resistance, unspecified: Secondary | ICD-10-CM

## 2024-12-21 DIAGNOSIS — Z6835 Body mass index (BMI) 35.0-35.9, adult: Secondary | ICD-10-CM

## 2024-12-21 NOTE — Progress Notes (Signed)
 "    WEIGHT SUMMARY AND BIOMETRICS  Vitals Temp: 98.3 F (36.8 C) BP: 108/68 Pulse Rate: (!) 118 SpO2: 95 %   Anthropometric Measurements Height: 5' 5 (1.651 m) Weight: 228 lb (103.4 kg) BMI (Calculated): 37.94 Weight at Last Visit: 224 lb Weight Lost Since Last Visit: 0 Weight Gained Since Last Visit: 4 lb Starting Weight: 222 lb Total Weight Loss (lbs): 0 lb (0 kg)   Body Composition  Body Fat %: 48.7 % Fat Mass (lbs): 111.4 lbs Muscle Mass (lbs): 111.4 lbs Total Body Water (lbs): 78.8 lbs Visceral Fat Rating : 14   Other Clinical Data Fasting: no Labs: no Today's Visit #: 19 Starting Date: 05/28/22    Chief Complaint:   OBESITY Angelica Weeks is here to discuss her progress with her obesity treatment plan.  She is on the the Category 1 Plan and states she is following her eating plan approximately 0 % of the time.  She states she is exercising: None  Interim History:  Angelica Weeks reports relaxed eating and hiatus from regular exercise over the holidays. She has not been working as her employer/friend ([email or web address]) has not required her assistance. She reports increased snacking while at home. 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  She reports frequent eating out at dinner due to lack of food options in home kitchen.  Subjective:   1. B12 deficiency  Latest Reference Range & Units 09/02/23 15:12 05/02/24 12:32 09/01/24 08:07  Vitamin B12 232 - 1,245 pg/mL >2000 (H) 1,312 (H) 875  (H): Data is abnormally high  B12 level has normalized  2. Insulin  resistance  Latest Reference Range & Units 07/01/22 15:33 01/22/23 16:05 09/01/24 08:07  INSULIN  2.6 - 24.9 uIU/mL 9.2 10.9 13.8    Latest Reference Range & Units 09/01/24 08:07  eGFR >59 mL/min/1.73 108   PCP manages Metformin  XR 500mg - 2 tabs daily with breakfast. 11/22/2024 she adjusted Metformin  XR 500mg  to both tablets  at dinner for better coverage of polyphagia She denies GI upset  Assessment/Plan:   1. B12 deficiency Monitor Labs  2. Insulin  resistance Increase lean protein and limit sugar/simple CHO Continue Metformin  XR 500mg - 2 tabs at dinner  3. BMI 35.0-35.9,adult current 38.1 (Primary)  Angelica Weeks is not currently in the action stage of change. As such, her goal is to get back to weightloss efforts . She has agreed to the Category 1 Plan.   Exercise goals: All adults should avoid inactivity. Some physical activity is better than none, and adults who participate in any amount of physical activity gain some health benefits. Adults should also include muscle-strengthening activities that involve all major muscle groups on 2 or more days a week.  Behavioral modification strategies: increasing lean protein intake, decreasing simple carbohydrates, increasing vegetables, increasing water intake, no skipping meals, meal planning and cooking strategies, keeping healthy foods in the home, ways to avoid boredom eating, better snacking choices, emotional eating strategies, planning for success, and decreasing junk food.  Angelica Weeks has agreed to follow-up with our clinic in 4 weeks. She was informed of the importance of frequent follow-up visits to maximize her success with intensive lifestyle modifications for her multiple health conditions.   Objective:   Blood pressure 108/68, pulse (!) 118, temperature 98.3 F (36.8 C), height 5' 5 (1.651 m), weight 228 lb (103.4 kg), SpO2 95%. Body mass index is 37.94 kg/m.  General: Cooperative, alert, well developed,  in no acute distress. HEENT: Conjunctivae and lids unremarkable. Cardiovascular: Regular rhythm.  Lungs: Normal work of breathing. Neurologic: No focal deficits.   Lab Results  Component Value Date   CREATININE 0.61 09/01/2024   BUN 9 09/01/2024   NA 140 09/01/2024   K 5.0 09/01/2024   CL 101 09/01/2024   CO2 24 09/01/2024   Lab Results   Component Value Date   ALT 22 09/01/2024   AST 18 09/01/2024   ALKPHOS 103 09/01/2024   BILITOT 0.2 09/01/2024   Lab Results  Component Value Date   HGBA1C 5.3 01/25/2024   HGBA1C 5.1 09/02/2023   HGBA1C 5.3 01/22/2023   HGBA1C 5.0 07/01/2022   HGBA1C 5.0 01/22/2022   Lab Results  Component Value Date   INSULIN  13.8 09/01/2024   INSULIN  9.2 07/01/2022   Lab Results  Component Value Date   TSH 1.390 01/25/2024   Lab Results  Component Value Date   CHOL 155 01/25/2024   HDL 71.20 01/25/2024   LDLCALC 58 01/25/2024   TRIG 133.0 01/25/2024   CHOLHDL 2 01/25/2024   Lab Results  Component Value Date   VD25OH 45.8 09/01/2024   VD25OH 46.2 05/02/2024   VD25OH 57.1 09/02/2023   Lab Results  Component Value Date   WBC 4.3 01/25/2024   HGB 12.9 01/25/2024   HCT 38.9 01/25/2024   MCV 95.7 01/25/2024   PLT 236.0 01/25/2024   Lab Results  Component Value Date   IRON 92 01/22/2021   TIBC 391 01/22/2021   Attestation Statements:   Reviewed by clinician on day of visit: allergies, medications, problem list, medical history, surgical history, family history, social history, and previous encounter notes.  Time spent on visit including pre-visit chart review and post-visit care and charting was 18 minutes.   I have reviewed the above documentation for accuracy and completeness, and I agree with the above. -  Cadynce Garrette d. Maille Halliwell, NP-C "

## 2025-01-05 ENCOUNTER — Other Ambulatory Visit: Payer: Self-pay | Admitting: Internal Medicine

## 2025-01-05 DIAGNOSIS — M1711 Unilateral primary osteoarthritis, right knee: Secondary | ICD-10-CM

## 2025-01-10 ENCOUNTER — Other Ambulatory Visit: Payer: Self-pay | Admitting: Internal Medicine

## 2025-01-10 DIAGNOSIS — L700 Acne vulgaris: Secondary | ICD-10-CM

## 2025-01-18 ENCOUNTER — Ambulatory Visit (INDEPENDENT_AMBULATORY_CARE_PROVIDER_SITE_OTHER): Admitting: Adult Health

## 2025-01-18 ENCOUNTER — Other Ambulatory Visit: Payer: Self-pay | Admitting: Internal Medicine

## 2025-01-18 DIAGNOSIS — E669 Obesity, unspecified: Secondary | ICD-10-CM

## 2025-01-18 DIAGNOSIS — E88819 Insulin resistance, unspecified: Secondary | ICD-10-CM

## 2025-01-25 ENCOUNTER — Encounter: Payer: Commercial Managed Care - HMO | Admitting: Internal Medicine
